# Patient Record
Sex: Male | Born: 1935 | Race: White | Hispanic: No | Marital: Single | Smoking: Former smoker
Health system: Southern US, Community
[De-identification: ages and names within clinical notes are randomized; demographics above are authoritative.]

## PROBLEM LIST (undated history)

## (undated) DIAGNOSIS — E785 Hyperlipidemia, unspecified: Secondary | ICD-10-CM

## (undated) DIAGNOSIS — M1990 Unspecified osteoarthritis, unspecified site: Secondary | ICD-10-CM

## (undated) DIAGNOSIS — I4891 Unspecified atrial fibrillation: Secondary | ICD-10-CM

## (undated) DIAGNOSIS — N529 Male erectile dysfunction, unspecified: Secondary | ICD-10-CM

## (undated) DIAGNOSIS — R7301 Impaired fasting glucose: Secondary | ICD-10-CM

## (undated) DIAGNOSIS — M519 Unspecified thoracic, thoracolumbar and lumbosacral intervertebral disc disorder: Secondary | ICD-10-CM

## (undated) DIAGNOSIS — K219 Gastro-esophageal reflux disease without esophagitis: Secondary | ICD-10-CM

## (undated) DIAGNOSIS — N400 Enlarged prostate without lower urinary tract symptoms: Secondary | ICD-10-CM

## (undated) DIAGNOSIS — M5090 Cervical disc disorder, unspecified, unspecified cervical region: Secondary | ICD-10-CM

## (undated) DIAGNOSIS — I10 Essential (primary) hypertension: Secondary | ICD-10-CM

## (undated) DIAGNOSIS — G479 Sleep disorder, unspecified: Secondary | ICD-10-CM

## (undated) DIAGNOSIS — K5792 Diverticulitis of intestine, part unspecified, without perforation or abscess without bleeding: Secondary | ICD-10-CM

## (undated) HISTORY — DX: Unspecified osteoarthritis, unspecified site: M19.90

## (undated) HISTORY — DX: Gastro-esophageal reflux disease without esophagitis: K21.9

## (undated) HISTORY — DX: Essential (primary) hypertension: I10

## (undated) HISTORY — DX: Cervical disc disorder, unspecified, unspecified cervical region: M50.90

## (undated) HISTORY — DX: Impaired fasting glucose: R73.01

## (undated) HISTORY — DX: Male erectile dysfunction, unspecified: N52.9

## (undated) HISTORY — DX: Unspecified thoracic, thoracolumbar and lumbosacral intervertebral disc disorder: M51.9

## (undated) HISTORY — DX: Diverticulitis of intestine, part unspecified, without perforation or abscess without bleeding: K57.92

## (undated) HISTORY — DX: Sleep disorder, unspecified: G47.9

## (undated) HISTORY — DX: Benign prostatic hyperplasia without lower urinary tract symptoms: N40.0

## (undated) HISTORY — DX: Hyperlipidemia, unspecified: E78.5

## (undated) HISTORY — DX: Unspecified atrial fibrillation: I48.91

---
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
## 1998-06-21 HISTORY — PX: KNEE ARTHROSCOPY: SUR90

## 1998-11-23 ENCOUNTER — Ambulatory Visit (HOSPITAL_COMMUNITY): Admission: RE | Admit: 1998-11-23 | Discharge: 1998-11-23 | Payer: Self-pay | Admitting: Cardiology

## 1998-12-03 ENCOUNTER — Encounter: Payer: Self-pay | Admitting: Internal Medicine

## 2001-10-21 ENCOUNTER — Encounter: Payer: Self-pay | Admitting: Internal Medicine

## 2001-10-21 LAB — CONVERTED CEMR LAB: PSA: 1.6 ng/mL

## 2003-11-22 LAB — FECAL OCCULT BLOOD, GUAIAC: Fecal Occult Blood: NEGATIVE

## 2004-09-27 ENCOUNTER — Ambulatory Visit: Payer: Self-pay | Admitting: Internal Medicine

## 2004-10-11 ENCOUNTER — Ambulatory Visit: Payer: Self-pay | Admitting: Internal Medicine

## 2004-10-21 HISTORY — PX: HEMIARTHROPLASTY SHOULDER FRACTURE: SUR653

## 2004-11-05 ENCOUNTER — Ambulatory Visit: Payer: Self-pay | Admitting: Internal Medicine

## 2004-11-09 ENCOUNTER — Other Ambulatory Visit: Payer: Self-pay

## 2004-11-09 ENCOUNTER — Encounter: Payer: Self-pay | Admitting: Internal Medicine

## 2004-11-16 ENCOUNTER — Inpatient Hospital Stay: Payer: Self-pay | Admitting: Unknown Physician Specialty

## 2004-11-19 ENCOUNTER — Ambulatory Visit: Payer: Self-pay | Admitting: Family Medicine

## 2004-11-23 ENCOUNTER — Ambulatory Visit: Payer: Self-pay | Admitting: Internal Medicine

## 2004-11-30 ENCOUNTER — Ambulatory Visit: Payer: Self-pay | Admitting: Internal Medicine

## 2004-12-06 ENCOUNTER — Ambulatory Visit: Payer: Self-pay | Admitting: Internal Medicine

## 2004-12-16 ENCOUNTER — Ambulatory Visit: Payer: Self-pay | Admitting: Internal Medicine

## 2004-12-22 HISTORY — PX: SHOULDER SURGERY: SHX246

## 2004-12-28 ENCOUNTER — Inpatient Hospital Stay: Payer: Self-pay | Admitting: Unknown Physician Specialty

## 2005-01-13 ENCOUNTER — Ambulatory Visit: Payer: Self-pay | Admitting: Internal Medicine

## 2005-01-27 ENCOUNTER — Ambulatory Visit: Payer: Self-pay | Admitting: Internal Medicine

## 2005-02-23 ENCOUNTER — Ambulatory Visit: Payer: Self-pay | Admitting: Internal Medicine

## 2005-03-23 ENCOUNTER — Ambulatory Visit: Payer: Self-pay | Admitting: Internal Medicine

## 2005-04-21 ENCOUNTER — Ambulatory Visit: Payer: Self-pay | Admitting: Internal Medicine

## 2005-05-02 ENCOUNTER — Ambulatory Visit: Payer: Self-pay | Admitting: Gastroenterology

## 2005-05-18 ENCOUNTER — Ambulatory Visit: Payer: Self-pay | Admitting: Internal Medicine

## 2005-05-21 LAB — HM COLONOSCOPY

## 2005-05-26 ENCOUNTER — Ambulatory Visit: Payer: Self-pay | Admitting: Gastroenterology

## 2005-05-26 ENCOUNTER — Encounter (INDEPENDENT_AMBULATORY_CARE_PROVIDER_SITE_OTHER): Payer: Self-pay | Admitting: *Deleted

## 2005-06-15 ENCOUNTER — Ambulatory Visit: Payer: Self-pay | Admitting: Internal Medicine

## 2005-07-13 ENCOUNTER — Ambulatory Visit: Payer: Self-pay | Admitting: Internal Medicine

## 2005-08-10 ENCOUNTER — Ambulatory Visit: Payer: Self-pay | Admitting: Internal Medicine

## 2005-09-07 ENCOUNTER — Ambulatory Visit: Payer: Self-pay | Admitting: Internal Medicine

## 2005-10-10 ENCOUNTER — Ambulatory Visit: Payer: Self-pay | Admitting: Internal Medicine

## 2005-10-21 ENCOUNTER — Ambulatory Visit: Payer: Self-pay | Admitting: Internal Medicine

## 2005-11-09 ENCOUNTER — Ambulatory Visit: Payer: Self-pay | Admitting: Internal Medicine

## 2005-12-07 ENCOUNTER — Ambulatory Visit: Payer: Self-pay | Admitting: Internal Medicine

## 2006-01-04 ENCOUNTER — Ambulatory Visit: Payer: Self-pay | Admitting: Internal Medicine

## 2006-02-15 ENCOUNTER — Ambulatory Visit: Payer: Self-pay | Admitting: Internal Medicine

## 2006-03-29 ENCOUNTER — Ambulatory Visit: Payer: Self-pay | Admitting: Internal Medicine

## 2006-04-05 ENCOUNTER — Ambulatory Visit: Payer: Self-pay | Admitting: Internal Medicine

## 2006-04-21 ENCOUNTER — Ambulatory Visit: Payer: Self-pay | Admitting: Internal Medicine

## 2006-05-18 ENCOUNTER — Ambulatory Visit: Payer: Self-pay | Admitting: Internal Medicine

## 2006-06-28 ENCOUNTER — Ambulatory Visit: Payer: Self-pay | Admitting: Internal Medicine

## 2006-08-02 ENCOUNTER — Ambulatory Visit: Payer: Self-pay | Admitting: Internal Medicine

## 2006-09-13 ENCOUNTER — Ambulatory Visit: Payer: Self-pay | Admitting: Internal Medicine

## 2006-10-27 ENCOUNTER — Ambulatory Visit: Payer: Self-pay | Admitting: Internal Medicine

## 2006-12-06 ENCOUNTER — Ambulatory Visit: Payer: Self-pay | Admitting: Internal Medicine

## 2007-01-08 ENCOUNTER — Ambulatory Visit: Payer: Self-pay | Admitting: Internal Medicine

## 2007-01-09 ENCOUNTER — Ambulatory Visit: Payer: Self-pay | Admitting: Internal Medicine

## 2007-01-10 ENCOUNTER — Ambulatory Visit: Payer: Self-pay | Admitting: Internal Medicine

## 2007-01-11 ENCOUNTER — Ambulatory Visit: Payer: Self-pay | Admitting: Internal Medicine

## 2007-01-15 ENCOUNTER — Ambulatory Visit: Payer: Self-pay | Admitting: Internal Medicine

## 2007-02-13 ENCOUNTER — Ambulatory Visit: Payer: Self-pay | Admitting: Internal Medicine

## 2007-02-19 ENCOUNTER — Encounter: Payer: Self-pay | Admitting: Internal Medicine

## 2007-02-19 ENCOUNTER — Ambulatory Visit: Payer: Self-pay | Admitting: Internal Medicine

## 2007-02-19 DIAGNOSIS — E785 Hyperlipidemia, unspecified: Secondary | ICD-10-CM | POA: Insufficient documentation

## 2007-02-19 DIAGNOSIS — N529 Male erectile dysfunction, unspecified: Secondary | ICD-10-CM | POA: Insufficient documentation

## 2007-02-19 DIAGNOSIS — J309 Allergic rhinitis, unspecified: Secondary | ICD-10-CM | POA: Insufficient documentation

## 2007-02-19 DIAGNOSIS — I10 Essential (primary) hypertension: Secondary | ICD-10-CM | POA: Insufficient documentation

## 2007-02-19 DIAGNOSIS — K5730 Diverticulosis of large intestine without perforation or abscess without bleeding: Secondary | ICD-10-CM | POA: Insufficient documentation

## 2007-02-19 DIAGNOSIS — I4891 Unspecified atrial fibrillation: Secondary | ICD-10-CM | POA: Insufficient documentation

## 2007-02-26 ENCOUNTER — Ambulatory Visit: Payer: Self-pay | Admitting: Internal Medicine

## 2007-02-28 ENCOUNTER — Ambulatory Visit: Payer: Self-pay | Admitting: Internal Medicine

## 2007-03-14 ENCOUNTER — Ambulatory Visit: Payer: Self-pay | Admitting: Internal Medicine

## 2007-04-25 ENCOUNTER — Ambulatory Visit: Payer: Self-pay | Admitting: Internal Medicine

## 2007-04-25 LAB — CONVERTED CEMR LAB
INR: 3.9
Prothrombin Time: 24 s

## 2007-04-27 ENCOUNTER — Ambulatory Visit: Payer: Self-pay | Admitting: Internal Medicine

## 2007-04-27 DIAGNOSIS — R972 Elevated prostate specific antigen [PSA]: Secondary | ICD-10-CM | POA: Insufficient documentation

## 2007-04-27 LAB — CONVERTED CEMR LAB
Albumin: 4.3 g/dL (ref 3.5–5.2)
BUN: 7 mg/dL (ref 6–23)
CO2: 30 meq/L (ref 19–32)
Calcium: 9.4 mg/dL (ref 8.4–10.5)
Chloride: 100 meq/L (ref 96–112)
Creatinine, Ser: 0.8 mg/dL (ref 0.4–1.5)
GFR calc Af Amer: 123 mL/min
GFR calc non Af Amer: 102 mL/min
Glucose, Bld: 93 mg/dL (ref 70–99)
PSA: 3.81 ng/mL (ref 0.10–4.00)
Phosphorus: 3.4 mg/dL (ref 2.3–4.6)
Potassium: 4.3 meq/L (ref 3.5–5.1)
Sodium: 136 meq/L (ref 135–145)

## 2007-05-09 ENCOUNTER — Ambulatory Visit: Payer: Self-pay | Admitting: Internal Medicine

## 2007-05-09 LAB — CONVERTED CEMR LAB
INR: 1.3
Prothrombin Time: 14.3 s

## 2007-05-14 ENCOUNTER — Ambulatory Visit: Payer: Self-pay | Admitting: Internal Medicine

## 2007-06-04 ENCOUNTER — Ambulatory Visit: Payer: Self-pay | Admitting: Internal Medicine

## 2007-07-09 ENCOUNTER — Ambulatory Visit: Payer: Self-pay | Admitting: Internal Medicine

## 2007-07-09 LAB — CONVERTED CEMR LAB
INR: 2.3
Prothrombin Time: 18.5 s

## 2007-08-20 ENCOUNTER — Ambulatory Visit: Payer: Self-pay | Admitting: Internal Medicine

## 2007-08-20 LAB — CONVERTED CEMR LAB
INR: 1.7
Prothrombin Time: 15.8 s

## 2007-08-30 ENCOUNTER — Ambulatory Visit: Payer: Self-pay | Admitting: Family Medicine

## 2007-08-31 ENCOUNTER — Telehealth (INDEPENDENT_AMBULATORY_CARE_PROVIDER_SITE_OTHER): Payer: Self-pay | Admitting: Internal Medicine

## 2007-09-03 ENCOUNTER — Ambulatory Visit: Payer: Self-pay | Admitting: Internal Medicine

## 2007-09-03 LAB — CONVERTED CEMR LAB
INR: 1.8
Prothrombin Time: 16.7 s

## 2007-10-01 ENCOUNTER — Ambulatory Visit: Payer: Self-pay | Admitting: Internal Medicine

## 2007-10-01 LAB — CONVERTED CEMR LAB
INR: 2.3
Prothrombin Time: 18.4 s

## 2007-10-16 ENCOUNTER — Telehealth (INDEPENDENT_AMBULATORY_CARE_PROVIDER_SITE_OTHER): Payer: Self-pay | Admitting: *Deleted

## 2007-10-22 ENCOUNTER — Encounter: Payer: Self-pay | Admitting: Internal Medicine

## 2007-11-02 ENCOUNTER — Telehealth (INDEPENDENT_AMBULATORY_CARE_PROVIDER_SITE_OTHER): Payer: Self-pay | Admitting: *Deleted

## 2007-11-05 ENCOUNTER — Ambulatory Visit: Payer: Self-pay | Admitting: Internal Medicine

## 2007-11-06 LAB — CONVERTED CEMR LAB
Albumin: 4.3 g/dL (ref 3.5–5.2)
BUN: 13 mg/dL (ref 6–23)
CO2: 30 meq/L (ref 19–32)
Calcium: 9.8 mg/dL (ref 8.4–10.5)
Chloride: 98 meq/L (ref 96–112)
Cholesterol: 198 mg/dL (ref 0–200)
Creatinine, Ser: 0.8 mg/dL (ref 0.4–1.5)
GFR calc Af Amer: 123 mL/min
GFR calc non Af Amer: 102 mL/min
Glucose, Bld: 93 mg/dL (ref 70–99)
HDL: 54.1 mg/dL (ref >39.0)
LDL Cholesterol: 131 mg/dL — ABNORMAL HIGH (ref 0–99)
Phosphorus: 3.6 mg/dL (ref 2.3–4.6)
Potassium: 4.2 meq/L (ref 3.5–5.1)
Sodium: 137 meq/L (ref 135–145)
Total CHOL/HDL Ratio: 3.7
Triglycerides: 64 mg/dL (ref 0–149)
VLDL: 13 mg/dL (ref 0–40)

## 2007-12-17 ENCOUNTER — Ambulatory Visit: Payer: Self-pay | Admitting: Internal Medicine

## 2007-12-17 LAB — CONVERTED CEMR LAB
INR: 2
Prothrombin Time: 17.4 s

## 2007-12-18 ENCOUNTER — Telehealth (INDEPENDENT_AMBULATORY_CARE_PROVIDER_SITE_OTHER): Payer: Self-pay | Admitting: *Deleted

## 2008-01-28 ENCOUNTER — Telehealth: Payer: Self-pay | Admitting: Family Medicine

## 2008-01-28 ENCOUNTER — Ambulatory Visit: Payer: Self-pay | Admitting: Internal Medicine

## 2008-01-28 LAB — CONVERTED CEMR LAB
INR: 2.1
Prothrombin Time: 17.1 s

## 2008-02-21 ENCOUNTER — Telehealth (INDEPENDENT_AMBULATORY_CARE_PROVIDER_SITE_OTHER): Payer: Self-pay | Admitting: *Deleted

## 2008-03-10 ENCOUNTER — Ambulatory Visit: Payer: Self-pay | Admitting: Internal Medicine

## 2008-03-10 LAB — CONVERTED CEMR LAB
INR: 3.4
Prothrombin Time: 22.4 s

## 2008-03-19 ENCOUNTER — Telehealth (INDEPENDENT_AMBULATORY_CARE_PROVIDER_SITE_OTHER): Payer: Self-pay | Admitting: *Deleted

## 2008-04-28 ENCOUNTER — Ambulatory Visit: Payer: Self-pay | Admitting: Internal Medicine

## 2008-04-29 LAB — CONVERTED CEMR LAB
ALT: 24 units/L (ref 0–53)
AST: 31 units/L (ref 0–37)
Albumin: 4.4 g/dL (ref 3.5–5.2)
Alkaline Phosphatase: 68 units/L (ref 39–117)
BUN: 9 mg/dL (ref 6–23)
Basophils Absolute: 0.1 10*3/uL (ref 0.0–0.1)
Basophils Relative: 1.3 % — ABNORMAL HIGH (ref 0.0–1.0)
Bilirubin, Direct: 0.1 mg/dL (ref 0.0–0.3)
CO2: 31 meq/L (ref 19–32)
Calcium: 9.5 mg/dL (ref 8.4–10.5)
Chloride: 100 meq/L (ref 96–112)
Cholesterol: 184 mg/dL (ref 0–200)
Creatinine, Ser: 0.8 mg/dL (ref 0.4–1.5)
Eosinophils Absolute: 0.5 10*3/uL (ref 0.0–0.7)
Eosinophils Relative: 8.5 % — ABNORMAL HIGH (ref 0.0–5.0)
GFR calc Af Amer: 123 mL/min
GFR calc non Af Amer: 101 mL/min
Glucose, Bld: 91 mg/dL (ref 70–99)
HCT: 42.8 % (ref 39.0–52.0)
HDL: 48.5 mg/dL (ref >39.0)
Hemoglobin: 15.1 g/dL (ref 13.0–17.0)
LDL Cholesterol: 120 mg/dL — ABNORMAL HIGH (ref 0–99)
Lymphocytes Relative: 24.8 % (ref 12.0–46.0)
MCHC: 35.2 g/dL (ref 30.0–36.0)
MCV: 92.7 fL (ref 78.0–100.0)
Monocytes Absolute: 0.6 10*3/uL (ref 0.1–1.0)
Monocytes Relative: 9.4 % (ref 3.0–12.0)
Neutro Abs: 3.2 10*3/uL (ref 1.4–7.7)
Neutrophils Relative %: 56 % (ref 43.0–77.0)
PSA: 4.02 ng/mL — ABNORMAL HIGH (ref 0.10–4.00)
Phosphorus: 3.2 mg/dL (ref 2.3–4.6)
Platelets: 219 10*3/uL (ref 150–400)
Potassium: 4.4 meq/L (ref 3.5–5.1)
RBC: 4.62 M/uL (ref 4.22–5.81)
RDW: 12.6 % (ref 11.5–14.6)
Sodium: 139 meq/L (ref 135–145)
TSH: 1.26 microintl units/mL (ref 0.35–5.50)
Total Bilirubin: 1.1 mg/dL (ref 0.3–1.2)
Total CHOL/HDL Ratio: 3.8
Total Protein: 7.2 g/dL (ref 6.0–8.3)
Triglycerides: 77 mg/dL (ref 0–149)
VLDL: 15 mg/dL (ref 0–40)
WBC: 5.9 10*3/uL (ref 4.5–10.5)

## 2008-05-13 ENCOUNTER — Telehealth (INDEPENDENT_AMBULATORY_CARE_PROVIDER_SITE_OTHER): Payer: Self-pay | Admitting: *Deleted

## 2008-05-28 ENCOUNTER — Telehealth (INDEPENDENT_AMBULATORY_CARE_PROVIDER_SITE_OTHER): Payer: Self-pay | Admitting: *Deleted

## 2008-06-09 ENCOUNTER — Ambulatory Visit: Payer: Self-pay | Admitting: Internal Medicine

## 2008-06-09 LAB — CONVERTED CEMR LAB
INR: 2.6
Prothrombin Time: 19.5 s

## 2008-07-16 ENCOUNTER — Telehealth: Payer: Self-pay | Admitting: Internal Medicine

## 2008-07-21 ENCOUNTER — Telehealth: Payer: Self-pay | Admitting: Internal Medicine

## 2008-07-21 ENCOUNTER — Ambulatory Visit: Payer: Self-pay | Admitting: Internal Medicine

## 2008-07-21 LAB — CONVERTED CEMR LAB
INR: 2.8
Prothrombin Time: 20.3 s

## 2008-07-29 ENCOUNTER — Telehealth: Payer: Self-pay | Admitting: Internal Medicine

## 2008-08-18 ENCOUNTER — Telehealth: Payer: Self-pay | Admitting: Internal Medicine

## 2008-09-01 ENCOUNTER — Ambulatory Visit: Payer: Self-pay | Admitting: Internal Medicine

## 2008-09-01 ENCOUNTER — Ambulatory Visit: Payer: Self-pay | Admitting: Family Medicine

## 2008-09-01 LAB — CONVERTED CEMR LAB
INR: 3.1
Prothrombin Time: 21.2 s

## 2008-09-08 ENCOUNTER — Ambulatory Visit: Payer: Self-pay | Admitting: Internal Medicine

## 2008-09-18 ENCOUNTER — Telehealth: Payer: Self-pay | Admitting: Internal Medicine

## 2008-09-30 ENCOUNTER — Ambulatory Visit: Payer: Self-pay | Admitting: Internal Medicine

## 2008-09-30 LAB — CONVERTED CEMR LAB
INR: 2.1
Prothrombin Time: 17.7 s

## 2008-10-15 ENCOUNTER — Telehealth: Payer: Self-pay | Admitting: Family Medicine

## 2008-10-29 ENCOUNTER — Ambulatory Visit: Payer: Self-pay | Admitting: Internal Medicine

## 2008-10-30 LAB — CONVERTED CEMR LAB
PSA, Free Pct: 18 — ABNORMAL LOW (ref >25)
PSA, Free: 0.7 ng/mL
PSA: 3.92 ng/mL (ref 0.10–4.00)

## 2008-11-06 ENCOUNTER — Telehealth: Payer: Self-pay | Admitting: Internal Medicine

## 2008-11-18 ENCOUNTER — Telehealth: Payer: Self-pay | Admitting: Internal Medicine

## 2008-12-10 ENCOUNTER — Ambulatory Visit: Payer: Self-pay | Admitting: Internal Medicine

## 2008-12-10 LAB — CONVERTED CEMR LAB
INR: 2
Prothrombin Time: 17.5 s

## 2009-01-13 ENCOUNTER — Telehealth: Payer: Self-pay | Admitting: Internal Medicine

## 2009-01-21 ENCOUNTER — Ambulatory Visit: Payer: Self-pay | Admitting: Internal Medicine

## 2009-01-21 LAB — CONVERTED CEMR LAB
INR: 2.2
Prothrombin Time: 18.1 s

## 2009-03-04 ENCOUNTER — Ambulatory Visit: Payer: Self-pay | Admitting: Internal Medicine

## 2009-03-04 LAB — CONVERTED CEMR LAB
INR: 4.1
Prothrombin Time: 24.7 s

## 2009-03-13 ENCOUNTER — Ambulatory Visit: Payer: Self-pay | Admitting: Internal Medicine

## 2009-03-13 LAB — CONVERTED CEMR LAB
INR: 2
Prothrombin Time: 17.5 s

## 2009-03-16 ENCOUNTER — Telehealth: Payer: Self-pay | Admitting: Internal Medicine

## 2009-03-27 ENCOUNTER — Ambulatory Visit: Payer: Self-pay | Admitting: Family Medicine

## 2009-03-27 LAB — CONVERTED CEMR LAB
INR: 1.7
Prothrombin Time: 15.9 s

## 2009-04-24 ENCOUNTER — Ambulatory Visit: Payer: Self-pay | Admitting: Internal Medicine

## 2009-04-24 LAB — CONVERTED CEMR LAB
INR: 3.3
Prothrombin Time: 21.8 s

## 2009-04-30 ENCOUNTER — Telehealth: Payer: Self-pay | Admitting: Internal Medicine

## 2009-05-05 ENCOUNTER — Ambulatory Visit: Payer: Self-pay | Admitting: Internal Medicine

## 2009-05-05 DIAGNOSIS — F39 Unspecified mood [affective] disorder: Secondary | ICD-10-CM | POA: Insufficient documentation

## 2009-05-06 LAB — CONVERTED CEMR LAB
ALT: 23 units/L (ref 0–53)
AST: 34 units/L (ref 0–37)
Albumin: 4.5 g/dL (ref 3.5–5.2)
Alkaline Phosphatase: 65 units/L (ref 39–117)
BUN: 18 mg/dL (ref 6–23)
Basophils Absolute: 0 10*3/uL (ref 0.0–0.1)
Basophils Relative: 0.6 % (ref 0.0–3.0)
Bilirubin, Direct: 0.1 mg/dL (ref 0.0–0.3)
CO2: 28 meq/L (ref 19–32)
Calcium: 9.5 mg/dL (ref 8.4–10.5)
Chloride: 102 meq/L (ref 96–112)
Creatinine, Ser: 0.8 mg/dL (ref 0.4–1.5)
Eosinophils Absolute: 0.4 10*3/uL (ref 0.0–0.7)
Eosinophils Relative: 6.8 % — ABNORMAL HIGH (ref 0.0–5.0)
Glucose, Bld: 91 mg/dL (ref 70–99)
HCT: 42.1 % (ref 39.0–52.0)
Hemoglobin: 14.6 g/dL (ref 13.0–17.0)
Lymphocytes Relative: 26.3 % (ref 12.0–46.0)
Lymphs Abs: 1.6 10*3/uL (ref 0.7–4.0)
MCHC: 34.7 g/dL (ref 30.0–36.0)
MCV: 93.6 fL (ref 78.0–100.0)
Monocytes Absolute: 0.5 10*3/uL (ref 0.1–1.0)
Monocytes Relative: 8.6 % (ref 3.0–12.0)
Neutro Abs: 3.6 10*3/uL (ref 1.4–7.7)
Neutrophils Relative %: 57.7 % (ref 43.0–77.0)
Phosphorus: 3.5 mg/dL (ref 2.3–4.6)
Platelets: 198 10*3/uL (ref 150.0–400.0)
Potassium: 3.6 meq/L (ref 3.5–5.1)
RBC: 4.49 M/uL (ref 4.22–5.81)
RDW: 12.3 % (ref 11.5–14.6)
Sodium: 137 meq/L (ref 135–145)
TSH: 0.76 microintl units/mL (ref 0.35–5.50)
Total Bilirubin: 1.1 mg/dL (ref 0.3–1.2)
Total Protein: 7.3 g/dL (ref 6.0–8.3)
WBC: 6.1 10*3/uL (ref 4.5–10.5)

## 2009-05-27 ENCOUNTER — Ambulatory Visit: Payer: Self-pay | Admitting: Internal Medicine

## 2009-05-27 LAB — CONVERTED CEMR LAB
INR: 2.7
Prothrombin Time: 19.8 s

## 2009-05-28 ENCOUNTER — Telehealth: Payer: Self-pay | Admitting: Internal Medicine

## 2009-07-08 ENCOUNTER — Ambulatory Visit: Payer: Self-pay | Admitting: Internal Medicine

## 2009-07-08 LAB — CONVERTED CEMR LAB
INR: 2.1
Prothrombin Time: 17.9 s

## 2009-07-28 ENCOUNTER — Telehealth: Payer: Self-pay | Admitting: Internal Medicine

## 2009-08-05 ENCOUNTER — Ambulatory Visit: Payer: Self-pay | Admitting: Internal Medicine

## 2009-08-05 LAB — CONVERTED CEMR LAB
INR: 3.2
Prothrombin Time: 21.6 s

## 2009-08-10 ENCOUNTER — Ambulatory Visit: Payer: Self-pay | Admitting: Family Medicine

## 2009-08-10 ENCOUNTER — Encounter: Payer: Self-pay | Admitting: Family Medicine

## 2009-08-11 ENCOUNTER — Telehealth: Payer: Self-pay | Admitting: Family Medicine

## 2009-08-18 ENCOUNTER — Ambulatory Visit: Payer: Self-pay | Admitting: Cardiovascular Disease

## 2009-09-01 ENCOUNTER — Ambulatory Visit: Payer: Self-pay

## 2009-09-01 ENCOUNTER — Encounter: Payer: Self-pay | Admitting: Cardiovascular Disease

## 2009-09-16 ENCOUNTER — Ambulatory Visit: Payer: Self-pay | Admitting: Internal Medicine

## 2009-09-16 LAB — CONVERTED CEMR LAB
INR: 3.1
Prothrombin Time: 21.2 s

## 2009-09-25 ENCOUNTER — Telehealth: Payer: Self-pay | Admitting: Internal Medicine

## 2009-10-12 ENCOUNTER — Telehealth: Payer: Self-pay | Admitting: Internal Medicine

## 2009-10-22 ENCOUNTER — Ambulatory Visit: Payer: Self-pay

## 2009-11-02 ENCOUNTER — Ambulatory Visit: Payer: Self-pay | Admitting: Internal Medicine

## 2009-11-02 LAB — CONVERTED CEMR LAB
INR: 2.2
Prothrombin Time: 18.2 s

## 2009-11-04 LAB — CONVERTED CEMR LAB
ALT: 32 units/L (ref 0–53)
AST: 38 units/L — ABNORMAL HIGH (ref 0–37)
Albumin: 4.7 g/dL (ref 3.5–5.2)
Alkaline Phosphatase: 63 units/L (ref 39–117)
BUN: 14 mg/dL (ref 6–23)
Basophils Absolute: 0 10*3/uL (ref 0.0–0.1)
Basophils Relative: 0 % (ref 0.0–3.0)
Bilirubin, Direct: 0.1 mg/dL (ref 0.0–0.3)
CO2: 31 meq/L (ref 19–32)
Calcium: 9.9 mg/dL (ref 8.4–10.5)
Chloride: 100 meq/L (ref 96–112)
Cholesterol: 191 mg/dL (ref 0–200)
Creatinine, Ser: 0.7 mg/dL (ref 0.4–1.5)
Eosinophils Absolute: 0 10*3/uL (ref 0.0–0.7)
Eosinophils Relative: 0.3 % (ref 0.0–5.0)
GFR calc non Af Amer: 117.52 mL/min (ref >60)
Glucose, Bld: 126 mg/dL — ABNORMAL HIGH (ref 70–99)
HCT: 44.9 % (ref 39.0–52.0)
HDL: 53.8 mg/dL (ref >39.00)
Hemoglobin: 15.2 g/dL (ref 13.0–17.0)
LDL Cholesterol: 128 mg/dL — ABNORMAL HIGH (ref 0–99)
Lymphocytes Relative: 15.1 % (ref 12.0–46.0)
Lymphs Abs: 0.9 10*3/uL (ref 0.7–4.0)
MCHC: 33.9 g/dL (ref 30.0–36.0)
MCV: 96.4 fL (ref 78.0–100.0)
Monocytes Absolute: 0.2 10*3/uL (ref 0.1–1.0)
Monocytes Relative: 3.3 % (ref 3.0–12.0)
Neutro Abs: 4.7 10*3/uL (ref 1.4–7.7)
Neutrophils Relative %: 81.3 % — ABNORMAL HIGH (ref 43.0–77.0)
PSA: 3.5 ng/mL (ref 0.10–4.00)
Phosphorus: 3.8 mg/dL (ref 2.3–4.6)
Platelets: 214 10*3/uL (ref 150.0–400.0)
Potassium: 4.1 meq/L (ref 3.5–5.1)
RBC: 4.66 M/uL (ref 4.22–5.81)
RDW: 12 % (ref 11.5–14.6)
Sodium: 139 meq/L (ref 135–145)
TSH: 0.6 microintl units/mL (ref 0.35–5.50)
Total Bilirubin: 1.1 mg/dL (ref 0.3–1.2)
Total CHOL/HDL Ratio: 4
Total Protein: 7.5 g/dL (ref 6.0–8.3)
Triglycerides: 47 mg/dL (ref 0.0–149.0)
VLDL: 9.4 mg/dL (ref 0.0–40.0)
WBC: 5.8 10*3/uL (ref 4.5–10.5)

## 2009-11-25 ENCOUNTER — Telehealth: Payer: Self-pay | Admitting: Family Medicine

## 2009-11-27 ENCOUNTER — Telehealth: Payer: Self-pay | Admitting: Family Medicine

## 2009-12-14 ENCOUNTER — Ambulatory Visit: Payer: Self-pay | Admitting: Internal Medicine

## 2009-12-14 ENCOUNTER — Telehealth: Payer: Self-pay | Admitting: Internal Medicine

## 2009-12-14 LAB — CONVERTED CEMR LAB
INR: 2.5
Prothrombin Time: 19.3 s

## 2009-12-23 ENCOUNTER — Telehealth: Payer: Self-pay | Admitting: Family Medicine

## 2009-12-29 ENCOUNTER — Telehealth: Payer: Self-pay | Admitting: Internal Medicine

## 2010-01-18 ENCOUNTER — Telehealth: Payer: Self-pay | Admitting: Internal Medicine

## 2010-01-25 ENCOUNTER — Ambulatory Visit: Payer: Self-pay | Admitting: Internal Medicine

## 2010-01-25 LAB — CONVERTED CEMR LAB: INR: 2

## 2010-01-26 ENCOUNTER — Telehealth: Payer: Self-pay | Admitting: Family Medicine

## 2010-02-25 ENCOUNTER — Telehealth: Payer: Self-pay | Admitting: Internal Medicine

## 2010-03-08 ENCOUNTER — Ambulatory Visit: Payer: Self-pay | Admitting: Internal Medicine

## 2010-03-08 LAB — CONVERTED CEMR LAB
INR: 2.1
Prothrombin Time: 25.8 s

## 2010-03-18 ENCOUNTER — Telehealth: Payer: Self-pay | Admitting: Internal Medicine

## 2010-04-06 ENCOUNTER — Ambulatory Visit: Payer: Self-pay | Admitting: Internal Medicine

## 2010-04-07 ENCOUNTER — Encounter: Payer: Self-pay | Admitting: Internal Medicine

## 2010-04-20 ENCOUNTER — Ambulatory Visit: Payer: Self-pay | Admitting: Internal Medicine

## 2010-04-20 LAB — CONVERTED CEMR LAB
INR: 2
Prothrombin Time: 23.9 s

## 2010-04-26 ENCOUNTER — Telehealth: Payer: Self-pay | Admitting: Internal Medicine

## 2010-04-28 ENCOUNTER — Telehealth: Payer: Self-pay | Admitting: Internal Medicine

## 2010-04-30 ENCOUNTER — Encounter (INDEPENDENT_AMBULATORY_CARE_PROVIDER_SITE_OTHER): Payer: Self-pay | Admitting: *Deleted

## 2010-05-04 ENCOUNTER — Ambulatory Visit: Payer: Self-pay | Admitting: Internal Medicine

## 2010-05-04 DIAGNOSIS — M5136 Other intervertebral disc degeneration, lumbar region: Secondary | ICD-10-CM | POA: Insufficient documentation

## 2010-05-04 DIAGNOSIS — R7301 Impaired fasting glucose: Secondary | ICD-10-CM | POA: Insufficient documentation

## 2010-05-05 LAB — CONVERTED CEMR LAB
Albumin: 4.5 g/dL (ref 3.5–5.2)
BUN: 16 mg/dL (ref 6–23)
CO2: 31 meq/L (ref 19–32)
Calcium: 9.5 mg/dL (ref 8.4–10.5)
Chloride: 101 meq/L (ref 96–112)
Creatinine, Ser: 0.7 mg/dL (ref 0.4–1.5)
GFR calc non Af Amer: 110.07 mL/min (ref >60)
Glucose, Bld: 97 mg/dL (ref 70–99)
Hgb A1c MFr Bld: 5.9 % (ref 4.6–6.5)
Phosphorus: 3.5 mg/dL (ref 2.3–4.6)
Potassium: 3.9 meq/L (ref 3.5–5.1)
Sodium: 140 meq/L (ref 135–145)

## 2010-05-17 ENCOUNTER — Telehealth: Payer: Self-pay | Admitting: Internal Medicine

## 2010-05-28 ENCOUNTER — Ambulatory Visit: Payer: Self-pay | Admitting: Internal Medicine

## 2010-05-28 LAB — CONVERTED CEMR LAB
INR: 1.9
Prothrombin Time: 22.7 s

## 2010-06-08 ENCOUNTER — Encounter: Payer: Self-pay | Admitting: Internal Medicine

## 2010-06-28 ENCOUNTER — Telehealth: Payer: Self-pay | Admitting: Internal Medicine

## 2010-07-07 ENCOUNTER — Ambulatory Visit: Payer: Self-pay | Admitting: Internal Medicine

## 2010-07-07 LAB — CONVERTED CEMR LAB
INR: 2.6
Prothrombin Time: 30.9 s

## 2010-07-29 ENCOUNTER — Ambulatory Visit: Payer: Self-pay | Admitting: Cardiovascular Disease

## 2010-08-12 ENCOUNTER — Telehealth: Payer: Self-pay | Admitting: Internal Medicine

## 2010-08-18 ENCOUNTER — Ambulatory Visit: Payer: Self-pay | Admitting: Internal Medicine

## 2010-08-18 LAB — CONVERTED CEMR LAB
INR: 1.9
Prothrombin Time: 22.3 s

## 2010-08-25 ENCOUNTER — Telehealth: Payer: Self-pay | Admitting: Internal Medicine

## 2010-09-06 ENCOUNTER — Telehealth: Payer: Self-pay | Admitting: Internal Medicine

## 2010-09-29 ENCOUNTER — Ambulatory Visit: Payer: Self-pay | Admitting: Internal Medicine

## 2010-09-29 LAB — CONVERTED CEMR LAB
INR: 2
Prothrombin Time: 24.1 s

## 2010-10-04 ENCOUNTER — Telehealth: Payer: Self-pay | Admitting: Internal Medicine

## 2010-10-25 ENCOUNTER — Telehealth: Payer: Self-pay | Admitting: Internal Medicine

## 2010-11-01 ENCOUNTER — Ambulatory Visit: Payer: Self-pay | Admitting: Internal Medicine

## 2010-11-01 LAB — CONVERTED CEMR LAB
INR: 1.5
Prothrombin Time: 18.2 s

## 2010-11-02 LAB — CONVERTED CEMR LAB
ALT: 27 units/L (ref 0–53)
AST: 36 units/L (ref 0–37)
Albumin: 4.6 g/dL (ref 3.5–5.2)
Alkaline Phosphatase: 77 units/L (ref 39–117)
BUN: 11 mg/dL (ref 6–23)
Basophils Absolute: 0 10*3/uL (ref 0.0–0.1)
Basophils Relative: 0.3 % (ref 0.0–3.0)
Bilirubin, Direct: 0.2 mg/dL (ref 0.0–0.3)
CO2: 31 meq/L (ref 19–32)
Calcium: 10 mg/dL (ref 8.4–10.5)
Chloride: 97 meq/L (ref 96–112)
Creatinine, Ser: 0.7 mg/dL (ref 0.4–1.5)
Eosinophils Absolute: 0.4 10*3/uL (ref 0.0–0.7)
Eosinophils Relative: 5.9 % — ABNORMAL HIGH (ref 0.0–5.0)
GFR calc non Af Amer: 111.65 mL/min (ref >60.00)
Glucose, Bld: 95 mg/dL (ref 70–99)
HCT: 45.2 % (ref 39.0–52.0)
Hemoglobin: 15.6 g/dL (ref 13.0–17.0)
Lymphocytes Relative: 21.8 % (ref 12.0–46.0)
Lymphs Abs: 1.6 10*3/uL (ref 0.7–4.0)
MCHC: 34.4 g/dL (ref 30.0–36.0)
MCV: 93.5 fL (ref 78.0–100.0)
Monocytes Absolute: 0.5 10*3/uL (ref 0.1–1.0)
Monocytes Relative: 6.6 % (ref 3.0–12.0)
Neutro Abs: 4.8 10*3/uL (ref 1.4–7.7)
Neutrophils Relative %: 65.4 % (ref 43.0–77.0)
Phosphorus: 3.7 mg/dL (ref 2.3–4.6)
Platelets: 242 10*3/uL (ref 150.0–400.0)
Potassium: 4.2 meq/L (ref 3.5–5.1)
RBC: 4.84 M/uL (ref 4.22–5.81)
RDW: 13.4 % (ref 11.5–14.6)
Sodium: 138 meq/L (ref 135–145)
TSH: 1.43 microintl units/mL (ref 0.35–5.50)
Total Bilirubin: 0.8 mg/dL (ref 0.3–1.2)
Total Protein: 7.5 g/dL (ref 6.0–8.3)
WBC: 7.3 10*3/uL (ref 4.5–10.5)

## 2010-11-17 ENCOUNTER — Ambulatory Visit: Payer: Self-pay | Admitting: Internal Medicine

## 2010-11-17 ENCOUNTER — Ambulatory Visit
Admission: RE | Admit: 2010-11-17 | Discharge: 2010-11-17 | Payer: Self-pay | Source: Home / Self Care | Attending: Internal Medicine | Admitting: Internal Medicine

## 2010-11-17 DIAGNOSIS — R519 Headache, unspecified: Secondary | ICD-10-CM | POA: Insufficient documentation

## 2010-11-17 DIAGNOSIS — R51 Headache: Secondary | ICD-10-CM | POA: Insufficient documentation

## 2010-11-17 LAB — CONVERTED CEMR LAB
INR: 1.9
Prothrombin Time: 22.3 s

## 2010-11-19 ENCOUNTER — Telehealth: Payer: Self-pay | Admitting: Internal Medicine

## 2010-11-25 ENCOUNTER — Ambulatory Visit
Admission: RE | Admit: 2010-11-25 | Discharge: 2010-11-25 | Payer: Self-pay | Source: Home / Self Care | Attending: Cardiovascular Disease | Admitting: Cardiovascular Disease

## 2010-11-25 ENCOUNTER — Telehealth: Payer: Self-pay | Admitting: Family Medicine

## 2010-11-25 ENCOUNTER — Encounter: Payer: Self-pay | Admitting: Cardiovascular Disease

## 2010-11-25 DIAGNOSIS — R42 Dizziness and giddiness: Secondary | ICD-10-CM | POA: Insufficient documentation

## 2010-12-01 ENCOUNTER — Telehealth: Payer: Self-pay | Admitting: Internal Medicine

## 2010-12-01 ENCOUNTER — Ambulatory Visit
Admission: RE | Admit: 2010-12-01 | Discharge: 2010-12-01 | Payer: Self-pay | Source: Home / Self Care | Attending: Internal Medicine | Admitting: Internal Medicine

## 2010-12-01 LAB — CONVERTED CEMR LAB
INR: 3.4
Prothrombin Time: 40.6 s

## 2010-12-15 ENCOUNTER — Telehealth: Payer: Self-pay | Admitting: Internal Medicine

## 2010-12-19 LAB — CONVERTED CEMR LAB
PSA, Free Pct: 12 — ABNORMAL LOW (ref >25)
PSA, Free: 0.5 ng/mL
PSA: 4.01 ng/mL — ABNORMAL HIGH (ref 0.10–4.00)

## 2010-12-21 NOTE — Progress Notes (Signed)
Summary: ALPRAZOLAM/ HYDROCODONE-APAP  Phone Note [facility] Request [facility] 9:15 AM  Refills Requested: Medication #1:  ALPRAZOLAM 1 MG TABS 1 tablet by mouth twice a day as needed   Last Refilled: 02/12/2009  Medication #2:  HYDROCODONE-ACETAMINOPHEN 5-500 MG TABS 1 two times a day as needed for pain   Last Refilled: 02/12/2009 Form on your desk   Method Requested: Fax to Local Pharmacy Initial call taken by: Mervin Hack CMA,  March 16, 2009 9:16 AM  Follow-up for Phone Call        okay #60 x 1 for each Follow-up by: Cindee Salt MD,  March 16, 2009 2:07 PM  Additional Follow-up for Phone Call Additional follow up Details #1::        Rx faxed to pharmacy Additional Follow-up by: Mervin Hack CMA,  March 16, 2009 3:39 PM      Prescriptions: HYDROCODONE-ACETAMINOPHEN 5-500 MG TABS (HYDROCODONE-ACETAMINOPHEN) 1 two times a day as needed for pain  #60 x 1   Entered by:   Mervin Hack CMA   Authorized by:   Cindee Salt MD   Signed by:   Mervin Hack CMA on 03/16/2009   Method used:   Handwritten   RxID:   [id number] ALPRAZOLAM 1 MG TABS (ALPRAZOLAM) 1 tablet by mouth twice a day as needed  #60 x 1   Entered by:   Mervin Hack CMA   Authorized by:   Cindee Salt MD   Signed by:   Mervin Hack CMA on 03/16/2009   Method used:   Handwritten   RxID:   [id number]

## 2010-12-21 NOTE — Progress Notes (Signed)
Summary: Hydrocodone/APAP  Phone Note Refill Request Message from:  Fax from Pharmacy on May 17, 2010 10:49 AM  Refills Requested: Medication #1:  HYDROCODONE-ACETAMINOPHEN 5-500 MG TABS take 1 by mouth three times a day as needed pain [facility] [address]  Phone:   [phone]   Method Requested: Telephone to Pharmacy Initial call taken by: Delilah Shan CMA Duncan Dull),  May 17, 2010 10:49 AM  Follow-up for Phone Call        having cervical spine problems and may need surgery change to four times daily as needed   #90 x 0 Follow-up by: Cindee Salt MD,  May 17, 2010 1:45 PM  Additional Follow-up for Phone Call Additional follow up Details #1::        Rx called to pharmacy Additional Follow-up by: DeShannon Katrinka Blazing CMA Duncan Dull),  May 17, 2010 2:38 PM    New/Updated Medications: HYDROCODONE-ACETAMINOPHEN 5-500 MG TABS (HYDROCODONE-ACETAMINOPHEN) Take 1 by mouth four times daily as needed Prescriptions: HYDROCODONE-ACETAMINOPHEN 5-500 MG TABS (HYDROCODONE-ACETAMINOPHEN) Take 1 by mouth four times daily as needed  #90 x 0   Entered by:   Mervin Hack CMA (AAMA)   Authorized by:   Cindee Salt MD   Signed by:   Mervin Hack CMA (AAMA) on 05/17/2010   Method used:   Telephoned to ...       [facility]  [id number]       [address]       [place], [place]  [address]       Ph: [phone] or [phone]       Fax: [phone]   RxID:   [id number]

## 2010-12-21 NOTE — Progress Notes (Signed)
Summary: refill request for vicodin  Phone Note Refill Request Message from:  Fax from Pharmacy  Refills Requested: Medication #1:  HYDROCODONE-ACETAMINOPHEN 5-500 MG TABS take 1 by mouth three times a day as needed pain   Last Refilled: 03/25/2010 Faxed request from [facility] [employer] is on your desk.  Initial call taken by: Lowella Petties CMA,  April 28, 2010 8:21 AM  Follow-up for Phone Call        okay #90 x 0 Follow-up by: Cindee Salt MD,  April 28, 2010 2:19 PM  Additional Follow-up for Phone Call Additional follow up Details #1::        Rx faxed to pharmacy Additional Follow-up by: DeShannon Smith CMA Duncan Dull),  April 28, 2010 4:33 PM    Prescriptions: HYDROCODONE-ACETAMINOPHEN 5-500 MG TABS (HYDROCODONE-ACETAMINOPHEN) take 1 by mouth three times a day as needed pain  #90 x 0   Entered by:   Mervin Hack CMA (AAMA)   Authorized by:   Cindee Salt MD   Signed by:   Mervin Hack CMA (AAMA) on 04/28/2010   Method used:   Handwritten   RxID:   [id number]

## 2010-12-21 NOTE — Progress Notes (Signed)
Summary: refill request for vicodin  Phone Note Refill Request Message from:  Fax from Pharmacy  Refills Requested: Medication #1:  HYDROCODONE-ACETAMINOPHEN 5-500 MG TABS 1 two times a day as needed for pain   Last Refilled: 10/27/2009 [facility] [address] on [facility].  Initial call taken by: Lowella Petties CMA,  February 25, 2010 8:14 AM  Follow-up for Phone Call        Please call should have had enough till end of April Is he using more than 2 per day?  If neck problems are worsening, may need to see pain specialist  okay only #60 x 0 will need appt to discuss situation before more Follow-up by: Cindee Salt MD,  February 25, 2010 1:10 PM  Additional Follow-up for Phone Call Additional follow up Details #1::        spoke with patient and he states he is having more pain because of the implants in his shoulder, the disc in his neck, deterioating disc in his back, and the torn ligament in his knee. 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 Pt states he tried not to take more than 2 per day, but somedays he can't help the pain but to take more. I advised pt he needs appt to re-access all the pain. Pt states he has appt here in june. rx faxed to pharmacy.  DeShannon Smith CMA Duncan Dull)  February 25, 2010 2:49 PM   Change the Rx to three times a day as needed  Let him know I will just see him for his appt in June Additional Follow-up by: Cindee Salt MD,  February 26, 2010 8:11 AM    Additional Follow-up for Phone Call Additional follow up Details #2::    rx for #90 was not called in, we had just done #60 on 02/25/2010 DeShannon Smith CMA (AAMA)  February 26, 2010 8:35 AM   New/Updated Medications: HYDROCODONE-ACETAMINOPHEN 5-500 MG TABS (HYDROCODONE-ACETAMINOPHEN) take 1 by mouth three times a day as needed  pain Prescriptions: HYDROCODONE-ACETAMINOPHEN 5-500 MG TABS (HYDROCODONE-ACETAMINOPHEN) take 1 by mouth three times a day as needed pain  #90 x 0   Entered by:   Mervin Hack CMA (AAMA)   Authorized by:   Cindee Salt MD   Signed by:   Mervin Hack CMA (AAMA) on 02/26/2010   Method used:   Telephoned to ...       [facility]  [id number]       [address]       [place], [place]  [address]       Ph: [phone] or [phone]       Fax: [phone]   RxID:   [id number] HYDROCODONE-ACETAMINOPHEN 5-500 MG TABS (HYDROCODONE-ACETAMINOPHEN) 1 two times a day as needed for pain  #60 x 0   Entered by:   Mervin Hack CMA (AAMA)   Authorized by:   Cindee Salt MD   Signed by:   Mervin Hack CMA (AAMA) on 02/25/2010   Method used:   Handwritten   RxID:   [id number]

## 2010-12-21 NOTE — Progress Notes (Signed)
Summary: refill request for hydrocodone  Phone Note Refill Request Message from:  Fax from Pharmacy  Refills Requested: Medication #1:  HYDROCODONE-ACETAMINOPHEN 5-500 MG TABS 1 two times a day as needed for pain   Last Refilled: 08/19/2008 [facility]  Initial call taken by: Lowella Petties,  September 18, 2008 4:13 PM  Follow-up for Phone Call        okay #60 x 1 Follow-up by: Cindee Salt MD,  September 18, 2008 5:24 PM  Additional Follow-up for Phone Call Additional follow up Details #1::        Rx faxed back to pharmacy. Additional Follow-up by: Sydell Axon,  September 18, 2008 5:28 PM      Prescriptions: HYDROCODONE-ACETAMINOPHEN 5-500 MG TABS (HYDROCODONE-ACETAMINOPHEN) 1 two times a day as needed for pain  #60 x 1   Entered by:   Sydell Axon   Authorized by:   Cindee Salt MD   Signed by:   Sydell Axon on 09/18/2008   Method used:   Handwritten   RxID:   [id number]

## 2010-12-21 NOTE — Assessment & Plan Note (Signed)
Summary: 2:15  ?SINUS,EAR INFECTION/CLE   Vital Signs:  Patient Profile:   76 Years Old Male Height:     68 inches Weight:      171.25 pounds Temp:     97.6 degrees F oral Pulse rate:   80 / minute Pulse rhythm:   regular BP sitting:   141 / 88  (left arm) Cuff size:   regular  Vitals Entered By: Delilah Shan (September 08, 2008 2:10 PM)                 Chief Complaint:  ? sinus infection.  History of Present Illness: Ongoing allergy problems since unable to get polaramine Claritin, chlor-trimeton, zyrtec not much help has stuck to claritin---slightly better when he takes 2 a day  [facility] some Noticed some gray discharge from his right ear--started a couple of weeks ago Constant post nasal drainage Hacking up phlegm--ongoing but may be some worse lately  has felt feverish at times slight chill and sweat this AM Slight sore throat    Current Allergies (reviewed today): * BEXTRA (VALDECOXIB) PERCOCET (OXYCODONE-ACETAMINOPHEN)  Past Medical History:    Reviewed history from 04/25/2007 and no changes required:       Allergic rhinitis       Atrial fibrillation       Diverticulosis, colon       Hyperlipidemia       Hypertension       Erectile dysfunction  Past Surgical History:    Reviewed history from 04/25/2007 and no changes required:       1950's  Dislocated shoulder       8/99      Right knee arthroscopy [facility])       11/03    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  NAD Head:     has maxillary tenderness Ears:     R ear normal and L ear normal.   Canals look fine Nose:     moderate congestion bilaterally Mouth:     no erythema and no exudates.   Neck:     supple and no cervical lymphadenopathy.   Lungs:     normal respiratory effort and normal breath sounds.      Impression & Recommendations:  Problem # 1:  SINUSITIS- ACUTE-NOS (ICD-461.9) Assessment: New seems to have bacterial sinus infection discussed taking 2 loratadine daily as he tends to get very congested analgesics His updated medication list for this problem includes:    Amoxicillin-pot Clavulanate 875-125 Mg Tabs (Amoxicillin-pot clavulanate) .Marland Kitchen... 1 two times a day   Complete Medication List: 1)  Alprazolam 1 Mg  Tabs (Alprazolam) .Marland Kitchen.. 1 tablet by mouth twice a day as needed 2)  Hydrochlorothiazide 25 Mg Tabs (Hydrochlorothiazide) .... Take 1 tablet by mouth every morning 3)  Coumadin 5 Mg Tabs (Warfarin sodium) .... As directed 4)  Multivitamins Tabs (Multiple vitamin) .Marland Kitchen.. 1 by mouth daily 5)  Prilosec Otc Tbec (Omeprazole magnesium tbec) .... As needed 6)  Mobic 15 Mg Tabs (Meloxicam) .Marland Kitchen.. 1 by mouth daily 7)  Vitamin C 500 Mg Tabs (Ascorbic acid) .Marland Kitchen.. 1 by mouth daily 8)  Potassium Tabs (Potassium tabs) .... Take one by mouth daily 9)  Claritin Tabs (Loratadine tabs) .... As needed 10)  Viagra 100 Mg Tabs (Sildenafil citrate) .... Take one by mouth as needed 11)  Hydrocodone-acetaminophen 5-500 Mg Tabs (Hydrocodone-acetaminophen) .Marland Kitchen.. 1 two times a day as needed for pain 12)  Tenormin 25 Mg Tabs (Atenolol) .... Take one by mouth daily 13)  Amoxicillin-pot Clavulanate 875-125 Mg Tabs (Amoxicillin-pot clavulanate) .Marland Kitchen.. 1 two times a day   Patient Instructions: 1)  Please schedule a follow-up appointment as needed.   Prescriptions: AMOXICILLIN-POT CLAVULANATE 875-125 MG TABS  (AMOXICILLIN-POT CLAVULANATE) 1 two times a day  #20 x 0   Entered and Authorized by:   Cindee Salt MD   Signed by:   Cindee Salt MD on 09/08/2008   Method used:   [facility]        [facility]  [address] [id number]       [address]       [place], [place]  [address]       Ph: [phone] or [phone]       Fax: [phone]   RxID:   [id number]  ] Current Allergies (reviewed today): * BEXTRA (VALDECOXIB) PERCOCET (OXYCODONE-ACETAMINOPHEN) Current Medications (including changes made in today's visit):  ALPRAZOLAM 1 MG TABS (ALPRAZOLAM) 1 tablet by mouth twice a day as needed HYDROCHLOROTHIAZIDE 25 MG TABS (HYDROCHLOROTHIAZIDE) Take 1 tablet by mouth every morning COUMADIN 5 MG TABS (WARFARIN SODIUM) As directed MULTIVITAMINS  TABS (MULTIPLE VITAMIN) 1 by mouth daily PRILOSEC OTC  TBEC (OMEPRAZOLE MAGNESIUM TBEC) as needed MOBIC 15 MG TABS (MELOXICAM) 1 by mouth daily VITAMIN C 500 MG TABS (ASCORBIC ACID) 1 by mouth daily POTASSIUM  TABS (POTASSIUM TABS) Take one by mouth daily CLARITIN  TABS (LORATADINE TABS) as needed VIAGRA 100 MG TABS (SILDENAFIL CITRATE) Take one by mouth as needed HYDROCODONE-ACETAMINOPHEN 5-500 MG TABS (HYDROCODONE-ACETAMINOPHEN) 1 two times a day as needed for pain TENORMIN 25 MG TABS (ATENOLOL) take one by mouth daily AMOXICILLIN-POT CLAVULANATE 875-125 MG TABS (AMOXICILLIN-POT CLAVULANATE) 1 two times a day

## 2010-12-21 NOTE — Letter (Signed)
Summary: [facility]  [facility]   Imported By: Lanelle Bal 05/05/2010 10:39:00  _____________________________________________________________________  External Attachment:    Type:   Image     Comment:   External Document  Appended Document: [facility] considering left median nerve release

## 2010-12-21 NOTE — Progress Notes (Signed)
Summary: ALPRAZOLAM/HYDROCODONE  Phone Note [facility]ill Request Message [facility] January 13, 2009 12:56 PM  Refills Requested: Medication #1:  ALPRAZOLAM 1 MG TABS 1 tablet by mouth twice a day as needed   Last Refilled: 12/16/2008  Medication #2:  HYDROCODONE-ACETAMINOPHEN 5-500 MG TABS 1 two times a day as needed for pain   Last Refilled: 12/15/2008 forms on your desk   Method Requested: Fax to Local Pharmacy Initial call taken by: Mervin Hack CMA,  January 13, 2009 12:56 PM  Follow-up for Phone Call        okay #60 x 1 for each Follow-up by: Cindee Salt MD,  January 13, 2009 1:51 PM  Additional Follow-up for Phone Call Additional follow up Details #1::        Rx faxed to pharmacy Additional Follow-up by: Mervin Hack CMA,  January 13, 2009 2:47 PM      Prescriptions: HYDROCODONE-ACETAMINOPHEN 5-500 MG TABS (HYDROCODONE-ACETAMINOPHEN) 1 two times a day as needed for pain  #60 x 1   Entered by:   Mervin Hack CMA   Authorized by:   Cindee Salt MD   Signed by:   Mervin Hack CMA on 01/13/2009   Method used:   Handwritten   RxID:   [id number] ALPRAZOLAM 1 MG TABS (ALPRAZOLAM) 1 tablet by mouth twice a day as needed  #60 x 1   Entered by:   Mervin Hack CMA   Authorized by:   Cindee Salt MD   Signed by:   Mervin Hack CMA on 01/13/2009   Method used:   Handwritten   RxID:   [id number]

## 2010-12-21 NOTE — Progress Notes (Signed)
  [facility] Note [facility]  Refills Requested: Medication #1:  ALPRAZOLAM 1 MG TABS 1 tablet by mouth twice a day  Method Requested: Fax to Local Pharmacy Initial call taken by: Wandra Mannan,  February 21, 2008 10:22 AM  Follow-up for Phone Call        okay #60 x 2 Follow-up by: Cindee Salt MD,  February 21, 2008 1:38 PM  Additional Follow-up for Phone Call Additional follow up Details #1::        rx faxed Additional Follow-up by: Wandra Mannan,  February 21, 2008 1:49 PM      Prescriptions: ALPRAZOLAM 1 MG TABS (ALPRAZOLAM) 1 tablet by mouth twice a day  #60 x 2   Entered by:   Wandra Mannan   Authorized by:   Cindee Salt MD   Signed by:   Wandra Mannan on 02/21/2008   Method used:   Handwritten   RxID:   [id number]

## 2010-12-21 NOTE — Assessment & Plan Note (Signed)
Summary: NUMBNESS IN LEFT HAND/DLO   Vital Signs:  Patient profile:   76 year old male Height:      66 inches Weight:      171.38 pounds BMI:     27.76 Temp:     97.8 degrees F oral Pulse rate:   56 / minute Pulse (ortho):   72 / minute Pulse rhythm:   irregular BP sitting:   134 / 76  (left arm) BP standing:   148 / 84 Cuff size:   regular  Vitals Entered By: Delilah Shan CMA (AAMA) (August 10, 2009 12:03 PM)  Serial Vital Signs/Assessments:  Time      Position  BP       Pulse  Resp  Temp     By 12:47 PM  Lying RA  158/94   72                    Lugene Fuquay CMA (AAMA) 12:47 PM  Lying LA  152/92   60                    Lugene Fuquay CMA (AAMA) 12:47 PM  Sitting   146/72   80                    Lugene Fuquay CMA (AAMA) 12:47 PM  Standing  148/84   72                    Lugene Fuquay CMA (AAMA)  CC: Numbness in left arm.  BP elevated at times and Pulse slower at times than usual.   History of Present Illness: 76 yo with h/o afib presents with 1 week h/o pain in left elbow and numbness in left 4th and fifth digits. Pain is 6/10 and is aggrevated by movement and improved by certain positions.  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  He denies any weakness or focal neruological symptoms.  He is very concerned that this is related to his atrial fibrillation because when he took his blood pressure at home today at was 144/92 and it typically runs in 130s/70s (Of note, in our records, runs 120s-140s/70s-90s).  Denies any dizziness, palpitations, or changes in headache pattern.  Medical history and medications reviewecd, old chart reviewed.  Allergies: 1)  * Bextra (Valdecoxib) 2)  Percocet (Oxycodone-Acetaminophen)  Past History:  Past Medical History: Last updated: 05/05/2009 Allergic rhinitis Atrial fibrillation Diverticulosis, colon Hyperlipidemia Hypertension Erectile  dysfunction Sleep disturbance Osteoarthritis  Family History: Reviewed history from 05/05/2009 and no changes required. HTN strong in family--both parents Mom died of stomach cancer Dad died of stroke 9 siblings--4 have died (1 in WW2, 2 other brothers when much older)  Review of Systems      See HPI General:  Denies fever. CV:  Denies chest pain or discomfort, difficulty breathing at night, fatigue, swelling of feet, and swelling of hands. MS:  Complains of joint pain; denies joint redness, joint swelling, and loss of strength. Neuro:  Denies brief paralysis, disturbances in coordination, falling down, inability to speak, visual disturbances, and weakness.  Physical Exam  General:  alert and normal appearance, anxious. Lungs:  normal respiratory effort and normal breath sounds.   Heart:  normal rate, no murmur, no gallop, and irregular rhythm.   Msk:  pos TTP over left lateral epicondyle, worsened  with resisted wrist extension/elbow extension.  normal ROM, no joint tenderness, no joint swelling, no joint warmth, and no redness over joints.   Neurologic:  alert & oriented X3, strength normal in all extremities, and gait normal.  cranial nerves II-XII intact, DTRs symmetrical and normal, and finger-to-nose normal.     Impression & Recommendations:  Problem # 1:  LATERAL EPICONDYLITIS, LEFT (ICD-726.32) Assessment New History and physical very consistent with epidcondylitis.  Discussed exercises and ways to decrease repetitive movements with that elbow.  If no improvement of symptoms, will come back for possible steroid injection or brace.  Problem # 2:  ATRIAL FIBRILLATION (ICD-427.31) Assessment: Unchanged Given his concern, feeling "funny" and risk factors,  obtained an EKG which was difficult to compare to prior given how old the prior EKG (2005) was.  He was dropping beats with non specific ST changes in lateral leads.  Will get cardiolyte given risk factors.  He is therapeutic  on his coumadin. His updated medication list for this problem includes:    Coumadin 5 Mg Tabs (Warfarin sodium) .Marland Kitchen... As directed    Tenormin 25 Mg Tabs (Atenolol) .Marland Kitchen... Take one by mouth daily  Orders: Cardiolite (Cardiolite) EKG w/ Interpretation (93000)  Complete Medication List: 1)  Alprazolam 1 Mg Tabs (Alprazolam) .Marland Kitchen.. 1 tablet by mouth twice a day as needed 2)  Hydrochlorothiazide 25 Mg Tabs (Hydrochlorothiazide) .... Take 1 tablet by mouth every morning 3)  Coumadin 5 Mg Tabs (Warfarin sodium) .... As directed 4)  Multivitamins Tabs (Multiple vitamin) .Marland Kitchen.. 1 by mouth daily 5)  Mobic 15 Mg Tabs (Meloxicam) .Marland Kitchen.. 1 by mouth daily 6)  Vitamin C 500 Mg Tabs (Ascorbic acid) .Marland Kitchen.. 1 by mouth daily 7)  Potassium Tabs (Potassium tabs) .... Take one by mouth daily 8)  Claritin Tabs (Loratadine tabs) .... As needed 9)  Viagra 100 Mg Tabs (Sildenafil citrate) .... Take one by mouth as needed 10)  Hydrocodone-acetaminophen 5-500 Mg Tabs (Hydrocodone-acetaminophen) .Marland Kitchen.. 1 two times a day as needed for pain 11)  Tenormin 25 Mg Tabs (Atenolol) .... Take one by mouth daily 12)  Zolpidem Tartrate 5 Mg Tabs (Zolpidem tartrate) .Marland Kitchen.. 1 at bedtime as needed to help sleep 13)  Zantac 150 Mg Tabs (Ranitidine hcl) .... Takes one tablet daily  Patient Instructions: 1)  You have tennis elbow. 2)  Try not to do repetitive movements with that arm. 3)  See if that helps, along with Tylenol. 4)  If it does not, we can get a brace that you wear at night. 5)  I am going to send you to get a cardiolite stress test to check out your other symptoms.  Current Allergies (reviewed today): * BEXTRA (VALDECOXIB) PERCOCET (OXYCODONE-ACETAMINOPHEN)

## 2010-12-21 NOTE — Assessment & Plan Note (Signed)
   Vital Signs:  Patient Profile:   76 Years Old Male Height:     68 inches Weight:      168.8 pounds BMI:     25.76 Temp:     98.7 degrees F oral Pulse rate:   68 / minute BP sitting:   122 / 78               Chief Complaint:  Swollen left leg--????MRSA.  History of Present Illness: woke 3/29----noted itchy area on L calf.  He noted knot there Got larger yesterday so went to urgent care----given Rx for bactroban and Septra MD there was worried about MRSA Some discharge from wound today No fever. May be slightly better today after 2 doses of antibiotic    Past Medical History:    Allergic rhinitis    Atrial fibrillation    Diverticulosis, colon    Hyperlipidemia    Hypertension     Review of Systems       Hand has completely healed up   Physical Exam  General:     Well-developed,well-nourished,in no acute distress; alert,appropriate and cooperative throughout examination Skin:     Pinpoint open area with pus expressed lateral distal left calf. Surrounding redness with mild warmth about 6-7 cm diameter    Impression & Recommendations:  Problem # 1:  CELLULITIS, LEFT LEG (ICD-682.6) Likely MRSA.  I explained that this is common and dictates the antibiotic choice but demystified it (urgent care doc had scared him).  P:  warm compresses      continue septra--consider refill if not completely gone by 10 days     change to clinda if not improving in 2-3 days

## 2010-12-21 NOTE — Progress Notes (Signed)
Summary: rx atenolol  Phone Note [facility] 10[facility]42 AM  e-scribe request for atenolol 25mg , not on patients med list.   Method Requested: Fax to Local Pharmacy Initial call taken by: Mervin Hack CMA,  November 06, 2008 10:43 AM  Follow-up for Phone Call        yes, it is the generic of tenormin  okay to refill x 1 year Follow-up by: Cindee Salt MD,  November 06, 2008 1:09 PM      Prescriptions: TENORMIN 25 MG TABS (ATENOLOL) take one by mouth daily  #30 x 12   Entered by:   Mervin Hack CMA   Authorized by:   Cindee Salt MD   Signed by:   Mervin Hack CMA on 11/06/2008   Method used:   [facility]  [id number]       [address] [facility], [place]  [address]       Ph: [phone] or [phone]       Fax: [phone]   RxID:   [id number]

## 2010-12-21 NOTE — Letter (Signed)
Summary: [facility] Form  [facility] Form   Imported By: Beau Fanny 10/23/2007 09:16:25  _____________________________________________________________________  External Attachment:    Type:   Image     Comment:   External Document

## 2010-12-21 NOTE — Assessment & Plan Note (Signed)
Summary: 6 month followup/rbh   Vital Signs:  Patient profile:   76 year old male Weight:      165 pounds Temp:     98.4 degrees F oral Pulse rate:   68 / minute Pulse rhythm:   regular BP sitting:   130 / 80  (left arm) Cuff size:   large  Vitals Entered By: Mervin Hack CMA (AAMA) (November 02, 2009 12:22 PM) CC: 6 month follow-up   History of Present Illness: Has cervical bulging disk Pressing on nerve giving numbness in left 5th finger and tip of fourth Had MRI Now on prednisone and flexeril No sig hand pain No weakness or loss of function Plans to follow up with Dr Gerrit Heck Will consider an epidural  Has done fine with his heart was worried this was from his heart had thorough work up which was benign No chest pain No SOB [facility] a [facility] his [facility] issues  No problems with anxiety does get some dizziness with flexeril    Allergies: 1)  * Bextra (Valdecoxib) 2)  Percocet (Oxycodone-Acetaminophen)  Past History:  Past medical, surgical, family and social histories (including risk factors) reviewed for relevance to current acute and chronic problems.  Past Medical History: Allergic rhinitis Atrial fibrillation, chronic persistent. Failed DCCV x 3 in 2000 Diverticulosis, colon Hyperlipidemia Hypertension Erectile dysfunction Sleep disturbance Osteoarthritis Lumbar and cervical disk disease  Past Surgical History: Reviewed history from 04/25/2007 and no changes required. 1950's  Dislocated shoulder 8/99      Right knee arthroscopy Gavin Potters) 11/03    Actinic chelitis (AU) 12/05    Left shoulder hemiarthroplasty 2/06      Right shoulder surgery  Family History: Reviewed history from 05/05/2009 and no changes required. HTN strong in family--both parents Mom died of stomach cancer Dad died of stroke 9 siblings--4 have died (1 in WW2, 2 other brothers when much older)  Social History: Reviewed history from 11/05/2007 and  no changes required. 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   Neck:  supple, no masses, no thyromegaly, no carotid bruits, and no cervical lymphadenopathy.   Lungs:  normal respiratory effort and normal breath sounds.   Heart:  normal rate, no murmur, no gallop, and irregular rhythm.   Abdomen:  soft and non-tender.   Msk:  no joint tenderness and no joint swelling.   Extremities:  no edema Psych:  normally interactive, good eye contact, not anxious appearing, and not depressed appearing.     Impression & Recommendations:  Problem # 1:  DEGENERATIVE DISC DISEASE (ICD-722.6) Assessment Comment Only some improvement on prednisone discussed not wanting surgery unless sig pain or disability  Problem # 2:  HYPERTENSION (ICD-401.9) Assessment: Unchanged  good control no changes needed  His updated medication list for this problem includes:    Hydrochlorothiazide 25 Mg Tabs (Hydrochlorothiazide) .Marland Kitchen... Take 1 tablet by mouth every morning    Tenormin 25 Mg Tabs (Atenolol) .Marland Kitchen... Take one by mouth daily  BP today: 130/80 Prior BP: 124/70 (08/18/2009)  Prior 10 Yr  Risk Heart Disease: Not enough information (04/27/2007)  Labs Reviewed: K+: 3.6 (05/05/2009) Creat: : 0.8 (05/05/2009)   Chol: 184 (04/28/2008)   HDL: 48.5 (04/28/2008)   LDL: 120 (04/28/2008)   TG: 77 (04/28/2008)  Orders: TLB-Renal Function Panel (80069-RENAL) TLB-CBC Platelet - w/Differential (85025-CBCD) TLB-TSH (Thyroid Stimulating Hormone) (84443-TSH) Venipuncture (16109)   Problem # 3:  SLEEP DISORDER (ICD-780.50) Assessment: Unchanged does okay with chronic ambien use  Problem # 4:  ATRIAL FIBRILLATION (ICD-427.31) Assessment: Unchanged good rate control on coumadin  His updated medication list for this problem includes:    Coumadin 5 Mg Tabs (Warfarin sodium) .Marland Kitchen... As directed    Tenormin 25 Mg Tabs (Atenolol) .Marland Kitchen... Take one by mouth daily  Problem # 5:  PSA, INCREASED (ICD-790.93) Assessment: Comment Only  he wishes to continue to monitor had been 4.02, then 3.92 1 year ago  Orders: TLB-PSA (Prostate Specific Antigen) (84153-PSA)  Complete Medication List: 1)  Alprazolam 1 Mg Tabs (Alprazolam) .Marland Kitchen.. 1 tablet by mouth twice a day as needed 2)  Hydrochlorothiazide 25 Mg Tabs (Hydrochlorothiazide) .... Take 1 tablet by mouth every morning 3)  Coumadin 5 Mg Tabs (Warfarin sodium) .... As directed 4)  Multivitamins Tabs (Multiple vitamin) .Marland Kitchen.. 1 by mouth daily 5)  Mobic 15 Mg Tabs (Meloxicam) .Marland Kitchen.. 1 by mouth daily 6)  Vitamin C 500 Mg Tabs (Ascorbic acid) .Marland Kitchen.. 1 by mouth daily 7)  Potassium Tabs (Potassium tabs) .... Take one by mouth daily 8)  Claritin Tabs (Loratadine tabs) .... As needed 9)  Viagra 100 Mg Tabs (Sildenafil citrate) .... Take one by mouth as needed 10)  Hydrocodone-acetaminophen 5-500 Mg Tabs (Hydrocodone-acetaminophen) .Marland Kitchen.. 1 two times a day as needed for pain 11)  Tenormin 25 Mg Tabs (Atenolol) .... Take one by mouth daily 12)  Zolpidem Tartrate 5 Mg Tabs (Zolpidem tartrate) .Marland Kitchen.. 1 at bedtime as needed to help sleep 13)  Zantac 150 Mg Tabs (Ranitidine hcl) .... Takes one tablet daily 14)  Prednisone 5 Mg/32ml Soln (Prednisone) .... Pt not sure of dose and strength 15)  Flexeril 10 Mg Tabs (Cyclobenzaprine hcl) .... Pt not sure of dose and strength  Other Orders: TLB-Lipid Panel (80061-LIPID) TLB-Hepatic/Liver Function Pnl (80076-HEPATIC)  Patient Instructions: 1)  Please schedule a follow-up appointment in 6 months .    Current Allergies (reviewed today): * BEXTRA (VALDECOXIB) PERCOCET (OXYCODONE-ACETAMINOPHEN)  Appended Document: 6 month followup/rbh     Clinical Lists Changes  Orders: Added new Service order of Flu Vaccine 58yrs + [phone]) - Signed Added new Service order of Admin 1st Vaccine (09811) - Signed Added new Service order of Admin 1st Vaccine [facility]) [phone]) - Signed Observations: Added new observation of FLU VAX#1VIS: 06/14/07 version given November 02, 2009. (11/02/2009 15:37) Added new observation of FLU VAXLOT: [id number] (11/02/2009 15:37) Added new observation of FLU VAX EXP: 05/20/2010 (11/02/2009 15:37) Added new observation of FLU VAXBY: DeShannon Smith CMA (AAMA) (11/02/2009 15:37) Added new observation of FLU VAXRTE: IM (11/02/2009 15:37) Added new observation of FLU VAX DSE: 0.5 ml (11/02/2009 15:37) Added new observation of FLU VAXMFR: [employer] (11/02/2009 15:37) Added new observation of FLU VAX SITE: left deltoid (11/02/2009 15:37) Added new observation of FLU VAX: Fluvax 3+ (11/02/2009 15:37)       Influenza Vaccine    Vaccine Type: Fluvax 3+    Site: left deltoid    Mfr: [employer]oSmithKline    Dose: 0.5 ml    Route: IM    Given by: Mervin Hack CMA (AAMA)    Exp. Date:  05/20/2010    Lot #: [id number]    VIS given: 06/14/07 version given November 02, 2009.  Flu Vaccine Consent Questions    Do you have a history of severe allergic reactions to this vaccine? no    Any prior history of allergic reactions to egg and/or gelatin? no    Do you have a sensitivity to the preservative Thimersol? no    Do you have a past history of Guillan-Barre Syndrome? no    Do you currently have an acute febrile illness? no    Have you ever had a severe reaction to latex? no    Vaccine information given and explained to patient? yes

## 2010-12-21 NOTE — Progress Notes (Signed)
  Phone Note Refill Request Message from:  Kirkland Hun [id number]  Refills Requested: Medication #1:  ALPRAZOLAM 1 MG TABS 1 tablet by mouth twice a day  Method Requested: Fax to Local Pharmacy Initial call taken by: Wandra Mannan,  December 18, 2007 8:31 AM  Follow-up for Phone Call        okay #60 x 1 Follow-up by: Cindee Salt MD,  December 18, 2007 9:05 AM  Additional Follow-up for Phone Call Additional follow up Details #1::        rx faxed Additional Follow-up by: Wandra Mannan,  December 18, 2007 9:08 AM      Prescriptions: ALPRAZOLAM 1 MG TABS (ALPRAZOLAM) 1 tablet by mouth twice a day  #60 x 1   Entered by:   Wandra Mannan   Authorized by:   Cindee Salt MD   Signed by:   Wandra Mannan on 12/18/2007   Method used:   Handwritten   RxID:   [id number]

## 2010-12-21 NOTE — Progress Notes (Signed)
Summary: zolpidem tartrate  Phone Note Refill Request Message from:  Fax from Pharmacy on September 06, 2010 1:57 PM  Refills Requested: Medication #1:  ZOLPIDEM TARTRATE 5 MG TABS 1 at bedtime as needed to help sleep   Last Refilled: 06/10/2010 [facility] [address] [phone] Form is on your desk.    Initial call taken by: Melody Comas,  September 06, 2010 1:57 PM  Follow-up for Phone Call        okay #90 x 0 Follow-up by: Cindee Salt MD,  September 06, 2010 5:31 PM  Additional Follow-up for Phone Call Additional follow up Details #1::        Rx faxed to pharmacy Additional Follow-up by: DeShannon Smith CMA Duncan Dull),  September 07, 2010 8:14 AM    Prescriptions: ZOLPIDEM TARTRATE 5 MG TABS (ZOLPIDEM TARTRATE) 1 at bedtime as needed to help sleep  #90 x 0   Entered by:   Mervin Hack CMA (AAMA)   Authorized by:   Cindee Salt MD   Signed by:   Mervin Hack CMA (AAMA) on 09/07/2010   Method used:   Handwritten   RxID:   [id number]

## 2010-12-21 NOTE — Progress Notes (Signed)
Summary: Rx Alprazolam  Phone Note Refill Request Call back at [phone] Message from:  [facility] [facility] on December 23, 2009 2:24 PM  Refills Requested: Medication #1:  ALPRAZOLAM 1 MG TABS 1 tablet by mouth twice a day as needed Received faxed refill request, please advise   Method Requested: Electronic Initial call taken by: Linde Gillis CMA Duncan Dull),  December 23, 2009 2:24 PM  Follow-up for Phone Call        Medication phoned to pharmacy.  Follow-up by: Delilah Shan CMA (AAMA),  December 23, 2009 2:40 PM    Prescriptions: ALPRAZOLAM 1 MG TABS (ALPRAZOLAM) 1 tablet by mouth twice a day as needed  #60 x 0   Entered and Authorized by:   Ruthe Mannan MD   Signed by:   Ruthe Mannan MD on 12/23/2009   Method used:   Handwritten   RxID:   [id number]

## 2010-12-21 NOTE — Progress Notes (Signed)
Summary: refill request for alprazolam  Phone Note Refill Request Message from:  Fax from Pharmacy  Refills Requested: Medication #1:  ALPRAZOLAM 1 MG TABS 1 tablet by mouth twice a day as needed   Last Refilled: 01/26/2010 [facility] [address] [facility] on [facility].  Initial call taken by: Lowella Petties CMA,  February 25, 2010 4:52 PM  Follow-up for Phone Call        okay #60 x 1 Follow-up by: Cindee Salt MD,  February 26, 2010 8:02 AM  Additional Follow-up for Phone Call Additional follow up Details #1::        Rx faxed to pharmacy Additional Follow-up by: DeShannon Smith CMA Duncan Dull),  February 26, 2010 8:35 AM    Prescriptions: ALPRAZOLAM 1 MG TABS (ALPRAZOLAM) 1 tablet by mouth twice a day as needed  #60 x 1   Entered by:   Mervin Hack CMA (AAMA)   Authorized by:   Cindee Salt MD   Signed by:   Mervin Hack CMA (AAMA) on 02/26/2010   Method used:   Handwritten   RxID:   [id number]

## 2010-12-21 NOTE — Assessment & Plan Note (Signed)
Summary: SINUS   Vital Signs:  Patient Profile:   76 Years Old Male Height:     68 inches Weight:      167 pounds Temp:     97.9 degrees F oral Pulse rate:   63 / minute BP sitting:   142 / 93  (left arm) Cuff size:   regular  Vitals Entered By: Cooper Render (August 30, 2007 3:50 PM)                 Chief Complaint:  sinus infection/ ha's/ cough.  History of Present Illness: Here with nasal congestion, HA,cough, face hurts, ST, no fever/chillsels bad--onset x2wks with head cold.  Taking Mucinex a nd Claritin--not helping.  Cant get the OTC allergy med he usep prevously for 30 yrs.    Eating and drinking OK, sleeping well.  Current Allergies (reviewed today): * BEXTRA (VALDECOXIB) PERCOCET (OXYCODONE-ACETAMINOPHEN)     Review of Systems      See HPI   Physical Exam  General:     alert, well-developed, and well-nourished. NAD Head:     normocephalic, atraumatic, and no abnormalities observed.   Eyes:     no injection.   Ears:     TMs retracted wit some lfluid Nose:     boggy and edematus, sinuses not tender Mouth:     injected, no exudate Lungs:     dry cough, no crackles and no wheezes.   Neurologic:     alert & oriented X3, sensation intact to light touch, and gait normal.   Skin:     turgor normal, color normal, and no rashes.   Psych:     normally interactive.      Impression & Recommendations:  Problem # 1:  URI (ICD-465.9) He will get some ChlorTrimeton to use tonight and call response in AM--this is the closest thing to lhis previous antihistamine available. Will add ABT if needed in next 5 d.  suspect this is allercic in origin. His updated medication list for this problem includes:    Mobic 15 Mg Tabs (Meloxicam) .Marland Kitchen... 1 by mouth daily    Claritin Tabs (Loratadine tabs) .Marland Kitchen... As needed   Complete Medication List: 1)  Alprazolam 1 Mg Tabs (Alprazolam) .Marland Kitchen.. 1 tablet by mouth twice a day 2)  Hydrochlorothiazide 25 Mg Tabs  (Hydrochlorothiazide) .... Take 1 tablet by mouth every morning 3)  Ambien 5 Mg Tabs (Zolpidem tartrate) 4)  Coumadin 5 Mg Tabs (Warfarin sodium) .... As directed 5)  Multivitamins Tabs (Multiple vitamin) .Marland Kitchen.. 1 by mouth daily 6)  Prilosec Otc Tbec (Omeprazole magnesium tbec) .... As needed 7)  Mobic 15 Mg Tabs (Meloxicam) .Marland Kitchen.. 1 by mouth daily 8)  Vitamin C 500 Mg Tabs (Ascorbic acid) .Marland Kitchen.. 1 by mouth daily 9)  Potassium Tabs (Potassium tabs) .... Daily 10)  Claritin Tabs (Loratadine tabs) .... As needed 11)  Viagra 100 Mg Tabs (Sildenafil citrate) .... As needed 12)  Hydrocodone-acetaminophen 5-500 Mg Tabs (Hydrocodone-acetaminophen) .... As needed     ]

## 2010-12-21 NOTE — Assessment & Plan Note (Signed)
Summary: ROA 6 MTHS CYD   Vital Signs:  Patient profile:   76 year old male Weight:      164 pounds Temp:     98.4 degrees F oral Pulse rate:   60 / minute Pulse rhythm:   regular BP sitting:   148 / 70  (left arm) Cuff size:   large  Vitals Entered By: Mervin Hack CMA Duncan Dull) (May 04, 2010 12:24 PM) CC: 6 month follow-up   History of Present Illness: Got over respiratory infection still with some mucus not sick now occ cough  Having joint aching  Bulging disc and radiculopathy in neck gives him headaches no sig arm radiation Seeing Dr Gerrit Heck who is considering surgery flexeril  no help still using hydrocodone  Having some nubmenss in left hand----part of 3rd and 4th fingers---not cervical radiculopathy (Dr Kemper Durie has done nerve conduction studies)  No heart troubles Recent eval by Dr Excell Seltzer no chest pain Occ sense of heart going fast--like when using garden tiller No sig SOB---stable DOE neck problems have caused him to stop exercise in the gym   Allergies: 1)  * Bextra (Valdecoxib) 2)  Percocet (Oxycodone-Acetaminophen)  Past History:  Past medical, surgical, family and social histories (including risk factors) reviewed for relevance to current acute and chronic problems.  Past Medical History: Allergic rhinitis Atrial fibrillation, chronic persistent. Failed DCCV x 3 in 2000 Diverticulosis, colon Hyperlipidemia Hypertension Erectile dysfunction Sleep disturbance Osteoarthritis Lumbar and cervical disk disease Impaired fasting glucose  Past Surgical History: Reviewed history from 04/25/2007 and no changes required. 1950's  Dislocated shoulder 8/99      Right knee arthroscopy [facility] Potters) 11/03    Actinic chelitis (AU) 12/05    Left shoulder hemiarthroplasty 2/06      Right shoulder surgery  Family History: Reviewed history from 05/05/2009 and no changes required. HTN strong in family--both parents Mom died of stomach cancer Dad died of  stroke 9 siblings--4 have died (1 in WW2, 2 other brothers when much older)  Social History: Reviewed history from 11/05/2007 and no changes required. 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   Neck:  supple, no masses, no thyromegaly, no carotid bruits, and no cervical lymphadenopathy.   Lungs:  normal respiratory effort and normal breath sounds.   Heart:  normal rate, no murmur, no gallop, and irregular rhythm.   Abdomen:  soft and non-tender.   Pulses:  1+ in feet Extremities:  no edema Psych:  normally interactive, good eye contact, not anxious appearing, and not depressed appearing.     Impression & Recommendations:  Problem # 1:  IMPAIRED FASTING GLUCOSE (ICD-790.21) Assessment Comment Only  last glucose 126 will recheck  Orders: TLB-A1C / Hgb A1C (Glycohemoglobin) (83036-A1C)  Problem # 2:  DEGENERATIVE DISC DISEASE, CERVICAL SPINE (ICD-722.4) Assessment: Deteriorated may need surgery Dr Gerrit Heck seeing him  Problem # 3:  HYPERTENSION (ICD-401.9) Assessment: Unchanged  reasonable control no changes needed  His updated medication list for this problem includes:    Hydrochlorothiazide 25 Mg Tabs (Hydrochlorothiazide) .Marland Kitchen... Take 1 tablet by mouth every morning    Tenormin 25 Mg Tabs (Atenolol) .Marland Kitchen... Take one by mouth daily  BP today: 148/70 Prior BP: 138/70 (04/06/2010)  Prior  10 Yr Risk Heart Disease: Not enough information (04/27/2007)  Labs Reviewed: K+: 4.1 (11/02/2009) Creat: : 0.7 (11/02/2009)   Chol: 191 (11/02/2009)   HDL: 53.80 (11/02/2009)   LDL: 128 (11/02/2009)   TG: 47.0 (11/02/2009)  Orders: Venipuncture (16109) TLB-Renal Function  Panel (80069-RENAL)  Problem # 4:  ATRIAL FIBRILLATION (ICD-427.31) Assessment: Comment Only   good rate control on coumadin  His updated medication list for this problem includes:    Coumadin 5 Mg Tabs (Warfarin sodium) .Marland Kitchen... As directed    Tenormin 25 Mg Tabs (Atenolol) .Marland Kitchen... Take one by mouth daily  Complete Medication List: 1)  Alprazolam 1 Mg Tabs (Alprazolam) .Marland Kitchen.. 1 tablet by mouth twice a day as needed 2)  Hydrochlorothiazide 25 Mg Tabs (Hydrochlorothiazide) .... Take 1 tablet by mouth every morning 3)  Coumadin 5 Mg Tabs (Warfarin sodium) .... As directed 4)  Viagra 100 Mg Tabs (Sildenafil citrate) .... Take one by mouth as needed 5)  Hydrocodone-acetaminophen 5-500 Mg Tabs (Hydrocodone-acetaminophen) .... Take 1 by mouth three times a day as needed pain 6)  Tenormin 25 Mg Tabs (Atenolol) .... Take one by mouth daily 7)  Zolpidem Tartrate 5 Mg Tabs (Zolpidem tartrate) .Marland Kitchen.. 1 at bedtime as needed to help sleep 8)  Zantac 150 Mg Tabs (Ranitidine hcl) .... Takes one tablet daily 9)  Potassium Tabs (Potassium tabs) .... Take one by mouth daily 10)  Multivitamins Tabs (Multiple vitamin) .Marland Kitchen.. 1 by mouth daily 11)  Vitamin C 500 Mg Tabs (Ascorbic acid) .Marland Kitchen.. 1 by mouth daily  Patient Instructions: 1)  Please schedule a follow-up appointment in 6 months .  Prescriptions: ZOLPIDEM TARTRATE 5 MG TABS (ZOLPIDEM TARTRATE) 1 at bedtime as needed to help sleep  #90 x 0   Entered by:   Mervin Hack CMA (AAMA)   Authorized by:   Cindee Salt MD   Signed by:   Mervin Hack CMA (AAMA) on 05/04/2010   Method used:   Print then Give to Patient   RxID:   [id number]   Current Allergies (reviewed today): * BEXTRA (VALDECOXIB) PERCOCET (OXYCODONE-ACETAMINOPHEN)

## 2010-12-21 NOTE — Assessment & Plan Note (Signed)
Summary: flu shot per ko/dlo   Nurse Visit    Prior Medications: ALPRAZOLAM 1 MG TABS (ALPRAZOLAM) 1 tablet by mouth twice a day as needed HYDROCHLOROTHIAZIDE 25 MG TABS (HYDROCHLOROTHIAZIDE) Take 1 tablet by mouth every morning COUMADIN 5 MG TABS (WARFARIN SODIUM) As directed MULTIVITAMINS  TABS (MULTIPLE VITAMIN) 1 by mouth daily PRILOSEC OTC  TBEC (OMEPRAZOLE MAGNESIUM TBEC) as needed MOBIC 15 MG TABS (MELOXICAM) 1 by mouth daily VITAMIN C 500 MG TABS (ASCORBIC ACID) 1 by mouth daily POTASSIUM  TABS (POTASSIUM TABS) Take one by mouth daily CLARITIN  TABS (LORATADINE TABS) as needed VIAGRA 100 MG TABS (SILDENAFIL CITRATE) Take one by mouth as needed HYDROCODONE-ACETAMINOPHEN 5-500 MG TABS (HYDROCODONE-ACETAMINOPHEN) 1 two times a day as needed for pain TENORMIN 25 MG TABS (ATENOLOL) take one by mouth daily Current Allergies: * BEXTRA (VALDECOXIB) PERCOCET (OXYCODONE-ACETAMINOPHEN)    Orders Added: 1)  Admin 1st Vaccine [90471] 2)  Flu Vaccine 8yrs + [16109]   Flu Vaccine Consent Questions     Do you have a history of severe allergic reactions to this vaccine? no    Any prior history of allergic reactions to egg and/or gelatin? no    Do you have a sensitivity to the preservative Thimersol? no    Do you have a past history of Guillan-Barre Syndrome? no    Do you currently have an acute febrile illness? no    Have you ever had a severe reaction to latex? no    Vaccine information given and explained to patient? yes    Are you currently pregnant? no    Lot Number:[id number]   Site Given  Left Deltoid IM].opcflu

## 2010-12-21 NOTE — Progress Notes (Signed)
Summary: refill request for alprazolam  Phone Note Refill Request Message from:  Fax from Pharmacy  Refills Requested: Medication #1:  ALPRAZOLAM 1 MG TABS 1 tablet by mouth twice a day as needed   Last Refilled: 03/28/2010 Faxed request from [facility] is on your desk.  Initial call taken by: Lowella Petties CMA,  April 26, 2010 8:26 AM  Follow-up for Phone Call        1 month with a refill done only about 5 weeks ago Please check with pharmacy about what is going on Cindee Salt MD  April 26, 2010 1:49 PM   rx was actually done on 02/26/2010, I don't know what happened on 03/18/2010 the pharmacist confirmed xanax was not sent on 03/18/2010. A hard copy of Zolpidem Tartrate was faxed on 03/18/2010. I'm trying to fix this in EMR but not sure how. DeShannon Katrinka Blazing CMA Duncan Dull)  April 26, 2010 5:48 PM   Just make it right with pharmacy and leave in the phone note Follow-up by: Cindee Salt MD,  April 26, 2010 7:58 PM  Additional Follow-up for Phone Call Additional follow up Details #1::        Rx called to pharmacy Additional Follow-up by: DeShannon Smith CMA Duncan Dull),  April 27, 2010 2:11 PM    Prescriptions: ALPRAZOLAM 1 MG TABS (ALPRAZOLAM) 1 tablet by mouth twice a day as needed  #60 x 1   Entered by:   Mervin Hack CMA (AAMA)   Authorized by:   Cindee Salt MD   Signed by:   Mervin Hack CMA (AAMA) on 04/27/2010   Method used:   [facility]phoned to ...       [id number]       [address]       [place], [place]  [address]       Ph: [phone] or [phone]       Fax: [phone]   RxID:   [id number]

## 2010-12-21 NOTE — Progress Notes (Signed)
Summary: refill requests for vicodin and xanax  Phone Note Refill Request Message from:  Fax from Pharmacy  Refills Requested: Medication #1:  ALPRAZOLAM 1 MG TABS 1 tablet by mouth twice a day as needed   Last Refilled: 10/15/2008  Medication #2:  HYDROCODONE-ACETAMINOPHEN 5-500 MG TABS 1 two times a day as needed for pain   Last Refilled: 10/20/2008 Faxed request from [facility], forms are on your desk.  Initial call taken by: Lowella Petties,  November 18, 2008 10:54 AM  Follow-up for Phone Call        okay #60 x 1 for each Follow-up by: Cindee Salt MD,  November 18, 2008 1:19 PM  Additional Follow-up for Phone Call Additional follow up Details #1::        rx faxed to pharmacy Additional Follow-up by: Mervin Hack CMA,  November 18, 2008 3:18 PM      Prescriptions: ALPRAZOLAM 1 MG TABS (ALPRAZOLAM) 1 tablet by mouth twice a day as needed  #60 x 1   Entered by:   Mervin Hack CMA   Authorized by:   Cindee Salt MD   Signed by:   Mervin Hack CMA on 11/18/2008   Method used:   Handwritten   RxID:   [id number] HYDROCODONE-ACETAMINOPHEN 5-500 MG TABS (HYDROCODONE-ACETAMINOPHEN) 1 two times a day as needed for pain  #60 x 1   Entered by:   Mervin Hack CMA   Authorized by:   Cindee Salt MD   Signed by:   Mervin Hack CMA on 11/18/2008   Method used:   Handwritten   RxID:   [id number]

## 2010-12-21 NOTE — Progress Notes (Signed)
  [facility]  Refills Requested: Medication #1:  HYDROCODONE-ACETAMINOPHEN 5-500 MG TABS 1 two times a day as needed for pain.  Method Requested: Fax to Local Pharmacy Initial call taken by: Wandra Mannan,  March 19, 2008 4:25 PM  Follow-up for Phone Call        okay #60 x 0 Follow-up by: Cindee Salt MD,  March 20, 2008 8:04 AM  Additional Follow-up for Phone Call Additional follow up Details #1::        rx faxed Additional Follow-up by: Wandra Mannan,  March 20, 2008 8:10 AM      Prescriptions: HYDROCODONE-ACETAMINOPHEN 5-500 MG TABS (HYDROCODONE-ACETAMINOPHEN) 1 two times a day as needed for pain  #60 x 0   Entered by:   Wandra Mannan   Authorized by:   Cindee Salt MD   Signed by:   Wandra Mannan on 03/20/2008   Method used:   Handwritten   RxID:   [id number]

## 2010-12-21 NOTE — Progress Notes (Signed)
Summary: alprazolam   Phone Note Refill Request Message from:  Patient on August 25, 2010 2:11 PM  Refills Requested: Medication #1:  ALPRAZOLAM 1 MG TABS 1 tablet by mouth twice a day as needed   Last Refilled: 07/27/2010 [facility] [address] [phone]. Form is on your desk.   Initial call taken by: Melody Comas,  August 25, 2010 2:11 PM  Follow-up for Phone Call        okay #60 x 1 Follow-up by: Cindee Salt MD,  August 26, 2010 1:21 PM  Additional Follow-up for Phone Call Additional follow up Details #1::        Rx faxed to pharmacy Additional Follow-up by: DeShannon Smith CMA [facility] Dull),  August 26, 2010 2:22 PM    Prescriptions: ALPRAZOLAM 1 MG TABS (ALPRAZOLAM) 1 tablet by mouth twice a day as needed  #60 x 1   Entered by:   Mervin Hack CMA (AAMA)   Authorized by:   Cindee Salt MD   Signed by:   Mervin Hack CMA (AAMA) on 08/26/2010   Method used:   Handwritten   RxID:   [id number]

## 2010-12-21 NOTE — Progress Notes (Signed)
Summary: Rx Hydrocodone  Phone Note Refill Request Call back at [phone] Message [facility] [facility] on January 18, 2010 9:00 AM  Refills Requested: Medication #1:  HYDROCODONE-ACETAMINOPHEN 5-500 MG TABS 1 two times a day as needed for pain   Last Refilled: 12/14/2009 Received faxed refill request, form in your IN box please advise   Method Requested: Fax to Local Pharmacy Initial call taken by: Linde Gillis CMA [facility] Dull),  January 18, 2010 9:01 AM  Follow-up for Phone Call        okay #60 x 1 Follow-up by: Cindee Salt MD,  January 18, 2010 11:54 AM  Additional Follow-up for Phone Call Additional follow up Details #1::        Rx faxed to pharmacy Additional Follow-up by: DeShannon Smith CMA [facility] Dull),  January 18, 2010 2:37 PM    Prescriptions: HYDROCODONE-ACETAMINOPHEN 5-500 MG TABS (HYDROCODONE-ACETAMINOPHEN) 1 two times a day as needed for pain  #60 x 1   Entered by:   Mervin Hack CMA (AAMA)   Authorized by:   Cindee Salt MD   Signed by:   Mervin Hack CMA (AAMA) on 01/18/2010   Method used:   Handwritten   RxID:   [id number]

## 2010-12-21 NOTE — Assessment & Plan Note (Signed)
Summary: COUGH AND CONGESTION/ 12:45 per dr Alphonsus Sias   Vital Signs:  Patient profile:   76 year old male Weight:      164 pounds O2 Sat:      99 % on Room air Temp:     98.3 degrees F oral Pulse rate:   64 / minute Pulse rhythm:   regular Resp:     18 per minute BP sitting:   138 / 70  (left arm) Cuff size:   large  Vitals Entered By: Mervin Hack CMA [facility] Dull) (Apr 06, 2010 1:21 PM)  O2 Flow:  Room air CC: cough, congestion   History of Present Illness: Started sneezing and eyes watering about 9 days ago seemed to be allergy problems uses antihistamines Generally goes to sleep early and gets up by 5PM Next day congestion was worse now has gone into cold with bad head congestion some clearing with mucinex DM Now into chest  Noted whistling with breathing and some wheezing last night some SOB No clear cut fever but felt hot and sweaty last night Lots of cough---only couging up slight specks  Allergies: 1)  * Bextra (Valdecoxib) 2)  Percocet (Oxycodone-Acetaminophen)  Past History:  Past medical, surgical, family and social histories (including risk factors) reviewed for relevance to current acute and chronic problems.  Past Medical History: Reviewed history from 11/02/2009 and no changes required. Allergic rhinitis Atrial fibrillation, chronic persistent. Failed DCCV x 3 in 2000 Diverticulosis, colon Hyperlipidemia Hypertension Erectile dysfunction Sleep disturbance Osteoarthritis Lumbar and cervical disk disease  Past Surgical History: Reviewed history from 04/25/2007 and no changes required. 1950's  Dislocated shoulder 8/99      Right knee arthroscopy [facility]) 11/03    [facility] chelitis ([facility]) 12/05    Left shoulder hemiarthroplasty 2/06      Right shoulder surgery  Family History: Reviewed history from 05/05/2009 and no changes required. HTN strong in family--both parents Mom died of stomach cancer Dad died of stroke 9 siblings--4 have died (1 in  WW2, 2 other brothers when much older)  Social History: Reviewed history from 11/05/2007 and no changes required. 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  NAD Head:  no sinus tenderness Ears:  R ear normal and L ear normal.   Nose:  mild inflammation Mouth:  no erythema and no exudates.   Neck:  supple, no masses, and no cervical lymphadenopathy.   Lungs:  normal respiratory effort, no intercostal retractions, and no accessory muscle use.  Mild prolonged exp phase with mild exp wheezing no dullness or crackles Additional Exam:  Spirometry normal or mild obstruction   Impression & Recommendations:  Problem # 1:  BRONCHITIS- ACUTE (ICD-466.0) Assessment New  with the wheezing and lower resp involvement, I am concerned about atypical will treat with azithromycin  His updated medication list for this problem includes:    Azithromycin 250 Mg Tabs (Azithromycin) .Marland Kitchen... 2 tabs today, then 1 tab daily for the next 4 days for bronchitis  Problem # 2:  WHEEZING (ICD-786.07) Assessment: New  appears to have inflammation from the resp infection will give short course of prednisone  Orders: Spirometry w/Graph (94010)  Complete Medication List: 1)  Alprazolam 1 Mg Tabs (Alprazolam) .Marland Kitchen.. 1 tablet by  mouth twice a day as needed 2)  Hydrochlorothiazide 25 Mg Tabs (Hydrochlorothiazide) .... Take 1 tablet by mouth every morning 3)  Coumadin 5 Mg Tabs (Warfarin sodium) .... As directed 4)  Viagra 100 Mg Tabs (Sildenafil citrate) .... Take one by mouth as needed 5)  Hydrocodone-acetaminophen 5-500 Mg Tabs (Hydrocodone-acetaminophen) .... Take 1 by mouth three times a day as needed pain 6)  Tenormin 25 Mg  Tabs (Atenolol) .... Take one by mouth daily 7)  Zolpidem Tartrate 5 Mg Tabs (Zolpidem tartrate) .Marland Kitchen.. 1 at bedtime as needed to help sleep 8)  Zantac 150 Mg Tabs (Ranitidine hcl) .... Takes one tablet daily 9)  Flexeril 10 Mg Tabs (Cyclobenzaprine hcl) .... Pt not sure of dose and strength 10)  Potassium Tabs (Potassium tabs) .... Take one by mouth daily 11)  Multivitamins Tabs (Multiple vitamin) .Marland Kitchen.. 1 by mouth daily 12)  Vitamin C 500 Mg Tabs (Ascorbic acid) .Marland Kitchen.. 1 by mouth daily 13)  Azithromycin 250 Mg Tabs (Azithromycin) .... 2 tabs today, then 1 tab daily for the next 4 days for bronchitis 14)  Prednisone 20 Mg Tabs (Prednisone) .... 2 tabs  daily for 5 days and then 1 tab daily for 5 days  Patient Instructions: 1)  Keep regular appt 2)  Call if breathing is not better by the end of the week Prescriptions: PREDNISONE 20 MG TABS (PREDNISONE) 2 tabs  daily for 5 days and then 1 tab daily for 5 days  #15 x 0   Entered and Authorized by:   Cindee Salt MD   Signed by:   Cindee Salt MD on 04/06/2010   Method used:   [facility]        [id number]       [address]       [place], [place]  [address]       Ph: [phone] or [phone]       Fax: [phone]   RxID:   [id number] AZITHROMYCIN 250 MG TABS (AZITHROMYCIN) 2 tabs today, then 1 tab daily for the next 4 days for bronchitis  #6 x 0   Entered and Authorized by:   Cindee Salt MD   Signed by:   Cindee Salt MD on 04/06/2010   Method used:   Electronically to        [id number] ([facility]       [address]       [place], [place]  [address]       Ph: [phone] or [phone]       Fax: [phone]   RxID:   [id number]   Current Allergies (reviewed today): * BEXTRA (VALDECOXIB) PERCOCET (OXYCODONE-ACETAMINOPHEN)

## 2010-12-21 NOTE — Progress Notes (Signed)
Summary: Rx Alprazolam  Phone Note Refill Request Call back at [phone] Message from:  [facility] on January 26, 2010 2:24 PM  Refills Requested: Medication #1:  ALPRAZOLAM 1 MG TABS 1 tablet by mouth twice a day as needed   Last Refilled: 12/23/2009 Received faxed refill request, please advise.   Method Requested: Telephone to Pharmacy Initial call taken by: Linde Gillis CMA Duncan Dull),  January 26, 2010 2:25 PM    Prescriptions: ALPRAZOLAM 1 MG TABS (ALPRAZOLAM) 1 tablet by mouth twice a day as needed  #60 x 0   Entered and Authorized by:   Shaune Leeks MD   Signed by:   Shaune Leeks MD on 01/26/2010   Method used:   Telephoned to ...       [id number]       [address]       [place], [place]  [address]       Ph: [phone] or [phone]       Fax: [phone]   RxID:   [id number]   Appended Document: Rx Alprazolam Rx called into [facility]

## 2010-12-21 NOTE — Progress Notes (Signed)
Summary: refill request for vicodin  Phone Note Refill Request Message from:  Fax from Pharmacy  Refills Requested: Medication #1:  HYDROCODONE-ACETAMINOPHEN 5-500 MG TABS Take 1 by mouth four times daily as needed   Last Refilled: 06/29/2010 Faxed request from [facility] is on your desk.  Initial call taken by: Lowella Petties CMA,  August 12, 2010 4:47 PM  Follow-up for Phone Call        okay #90 x 1 Follow-up by: Cindee Salt MD,  August 13, 2010 8:11 AM  Additional Follow-up for Phone Call Additional follow up Details #1::        Rx called to pharmacy Additional Follow-up by: Linde Gillis CMA [facility] Dull),  August 13, 2010 8:14 AM    Prescriptions: HYDROCODONE-ACETAMINOPHEN 5-500 MG TABS (HYDROCODONE-ACETAMINOPHEN) Take 1 by mouth four times daily as needed  #90 x 1   Entered by:   Linde Gillis CMA (AAMA)   Authorized by:   Cindee Salt MD   Signed by:   Linde Gillis CMA (AAMA) on 08/13/2010   Method used:   Telephoned to ...       [id number]       [address]       [place], [place]  [address]       Ph: [phone] or [phone]       Fax: [phone]   RxID:   [id number]

## 2010-12-21 NOTE — Progress Notes (Signed)
Summary: vicodin refill request  Phone Note Refill Request Message from:  Fax from Pharmacy  Refills Requested: Medication #1:  HYDROCODONE-ACETAMINOPHEN 5-500 MG TABS 1 two times a day as needed for pain   Last Refilled: 06/11/2008 [facility] [address]  Initial call taken by: Lowella Petties,  July 16, 2008 2:56 PM  Follow-up for Phone Call        okay #60 x 1 Follow-up by: Cindee Salt MD,  July 17, 2008 1:16 PM  Additional Follow-up for Phone Call Additional follow up Details #1::        PRESCRIB. FAXED TO PHARMACY// Additional Follow-up by: Providence Crosby,  July 17, 2008 1:53 PM      Prescriptions: HYDROCODONE-ACETAMINOPHEN 5-500 MG TABS (HYDROCODONE-ACETAMINOPHEN) 1 two times a day as needed for pain  #60 x 1   Entered by:   Providence Crosby   Authorized by:   Cindee Salt MD   Signed by:   Providence Crosby on 07/17/2008   Method used:   Print then Give to Patient   RxID:   [id number]

## 2010-12-21 NOTE — Progress Notes (Signed)
Summary: Hydrocodone/Acetaminophen  Phone Note Refill Request Message from:  Fax from Pharmacy on November 27, 2009 4:24 PM  Refills Requested: Medication #1:  HYDROCODONE-ACETAMINOPHEN 5-500 MG TABS 1 two times a day as needed for pain [facility]  Phone:   [phone]   Method Requested: Telephone to Pharmacy Initial call taken by: Delilah Shan CMA [facility] Dull),  November 27, 2009 4:25 PM  Follow-up for Phone Call        ok to call in 30 tablets, no refills. Take 1-2 tablets every 6 hours as needed for pain. Follow-up by: Ruthe Mannan MD,  November 27, 2009 4:29 PM  Additional Follow-up for Phone Call Additional follow up Details #1::        Medication phoned to pharmacy.   #30 with 0 RF.  The entry for #60 with 1 RF is incorrect. Additional Follow-up by: Delilah Shan CMA (AAMA),  November 27, 2009 5:08 PM    Prescriptions: HYDROCODONE-ACETAMINOPHEN 5-500 MG TABS (HYDROCODONE-ACETAMINOPHEN) 1 two times a day as needed for pain  #30 x 0   Entered by:   Delilah Shan CMA (AAMA)   Authorized by:   Ruthe Mannan MD   Signed by:   Delilah Shan CMA (AAMA) on 11/27/2009   Method used:   Handwritten   RxID:   [id number] HYDROCODONE-ACETAMINOPHEN 5-500 MG TABS (HYDROCODONE-ACETAMINOPHEN) 1 two times a day as needed for pain  #60 x 1   Entered by:   Delilah Shan CMA (AAMA)   Authorized by:   Ruthe Mannan MD   Signed by:   Delilah Shan CMA (AAMA) on 11/27/2009   Method used:   Handwritten   RxID:   [id number]

## 2010-12-21 NOTE — Progress Notes (Signed)
Summary: refill request for ambien  Phone Note Refill Request Message from:  Fax from Pharmacy  Refills Requested: Medication #1:  ZOLPIDEM TARTRATE 5 MG TABS 1 at bedtime as needed to help sleep   Last Refilled: 12/29/2009 [facility] [facility] on [facility].  Initial call taken by: Lowella Petties CMA,  March 18, 2010 11:12 AM  Follow-up for Phone Call        okay #90 x 1 Follow-up by: Cindee Salt MD,  March 18, 2010 1:08 PM  Additional Follow-up for Phone Call Additional follow up Details #1::        Rx faxed to pharmacy Additional Follow-up by: DeShannon Smith CMA [facility]ull),  March 18, 2010 3:07 PM    Prescriptions: ALPRAZOLAM 1 MG TABS (ALPRAZOLAM) 1 tablet by mouth twice a day as needed  #60 x 1   Entered by:   Mervin Hack CMA (AAMA)   Authorized by:   Cindee Salt MD   Signed by:   Mervin Hack CMA (AAMA) on 03/18/2010   Method used:   Handwritten   RxID:   [id number]

## 2010-12-21 NOTE — Progress Notes (Signed)
Summary: Rx Alprazolam  Phone Note Refill Request Call back at [phone] Message from:  [facility] on November 25, 2009 9:06 AM  Refills Requested: Medication #1:  ALPRAZOLAM 1 MG TABS 1 tablet by mouth twice a day as needed   Last Refilled: 10/27/2009  Medication #2:  HYDROCODONE-ACETAMINOPHEN 5-500 MG TABS 1 two times a day as needed for pain   Last Refilled: 10/27/2009 Received faxed refill request, please advise   Method Requested: Electronic Initial call taken by: Linde Gillis CMA [facility] Dull),  November 25, 2009 9:06 AM  Follow-up for Phone Call        Precription in my box to fax or have pt pick up. Follow-up by: Ruthe Mannan MD,  November 25, 2009 10:24 AM    Prescriptions: HYDROCHLOROTHIAZIDE 25 MG TABS (HYDROCHLOROTHIAZIDE) Take 1 tablet by mouth every morning  #90 x 0   Entered and Authorized by:   Ruthe Mannan MD   Signed by:   Ruthe Mannan MD on 11/25/2009   Method used:   Print then Give to Patient   RxID:   [id number] ALPRAZOLAM 1 MG TABS (ALPRAZOLAM) 1 tablet by mouth twice a day as needed  #60 x 0   Entered and Authorized by:   Ruthe Mannan MD   Signed by:   Ruthe Mannan MD on 11/25/2009   Method used:   Print then Give to Patient   RxID:   [id number]   Appended Document: Rx Alprazolam Medication phoned to pharmacy.

## 2010-12-21 NOTE — Progress Notes (Signed)
Summary: refill request for vicodin, xanax  Phone Note Refill Request Message from:  Fax from Pharmacy  Refills Requested: Medication #1:  HYDROCODONE-ACETAMINOPHEN 5-500 MG TABS Take 1 by mouth four times daily as needed   Last Refilled: 05/17/2010  Medication #2:  ALPRAZOLAM 1 MG TABS 1 tablet by mouth twice a day as needed   Last Refilled: 05/27/2010 Faxed request from [facility], [phone], is on your desk.  Initial call taken by: Lowella Petties CMA,  June 28, 2010 2:56 PM  Follow-up for Phone Call        okay alprazolam #60 x 1 hydrocodone #90 x 0 Follow-up by: Cindee Salt MD,  June 28, 2010 5:40 PM  Additional Follow-up for Phone Call Additional follow up Details #1::        Rx called to pharmacy Additional Follow-up by: Sydell Axon LPN,  June 28, 2010 6:02 PM    Prescriptions: HYDROCODONE-ACETAMINOPHEN 5-500 MG TABS (HYDROCODONE-ACETAMINOPHEN) Take 1 by mouth four times daily as needed  #90 x 0   Entered by:   Sydell Axon LPN   Authorized by:   Cindee Salt MD   Signed by:   Sydell Axon LPN on 45/40/9811   Method used:   Telephoned to ...       [id number]       [address]       [place], [place]  [address]       Ph: [phone] or [phone]       Fax: [phone]   RxID:   [id number] ALPRAZOLAM 1 MG TABS (ALPRAZOLAM) 1 tablet by mouth twice a day as needed  #60 x 1   Entered by:   Sydell Axon LPN   Authorized by:   Cindee Salt MD   Signed by:   Sydell Axon LPN on 38/75/6433   Method used:   Telephoned to ...       [id number]       [address]       [place], [place]  [address]       Ph: [phone] or [phone]       Fax: [phone]   RxID:   [id number]

## 2010-12-21 NOTE — Assessment & Plan Note (Signed)
Summary: F1Y/AMD  Medications Added NEURONTIN 300 MG CAPS (GABAPENTIN) one tablet three times a day      Allergies Added:   Visit Type:  Follow-up Referring Provider:  Roxy Manns, MD Primary Provider:  Roxy Manns, MD  CC:  Denies chest pain or shortness of breath..  History of Present Illness: 76 year-old gentleman with chronic  atrial fibrillation and HTN, remote hx of  DC cardioversion that was unsuccessful, on rate-control and anticoagulation for the past several years.  The patient is very active. He works hard with yardwork in the Spring and Summer months. He works out at [employer] in the Winter months. He typically exercises for 20 min on the treadmill and 20 min on the exercise bike.  he reports having a bulging disc and has severe neck pain. He also has a history of bilateral shoulder surgery, replacement. He has a history of arthroscopic knee surgery on the right and feels that his right knee is having problems again.  He denies chest pain, shortness of breath, edema, or palpitations. no lightheadedness or near syncope.  EKG shows atrial ablation with ventricular rate of 60 with occasional pauses of up to 2 seconds, no significant ST or T wave changes.     Current Medications (verified): 1)  Alprazolam 1 Mg Tabs (Alprazolam) .Marland Kitchen.. 1 Tablet By Mouth Twice A Day As Needed 2)  Hydrochlorothiazide 25 Mg Tabs (Hydrochlorothiazide) .... Take 1 Tablet By Mouth Every Morning 3)  Coumadin 5 Mg Tabs (Warfarin Sodium) .... As Directed 4)  Viagra 100 Mg Tabs (Sildenafil Citrate) .... Take One By Mouth As Needed 5)  Hydrocodone-Acetaminophen 5-500 Mg Tabs (Hydrocodone-Acetaminophen) .... Take 1 By Mouth Four Times Daily As Needed 6)  Tenormin 25 Mg Tabs (Atenolol) .... Take One By Mouth Daily 7)  Zolpidem Tartrate 5 Mg Tabs (Zolpidem Tartrate) .Marland Kitchen.. 1 At Bedtime As Needed To Help Sleep 8)  Zantac 150 Mg Tabs (Ranitidine Hcl) .... Takes One Tablet Daily 9)  Potassium  Tabs (Potassium  Tabs) .... Take One By Mouth Daily 10)  Multivitamins  Tabs (Multiple Vitamin) .Marland Kitchen.. 1 By Mouth Daily 11)  Vitamin C 500 Mg Tabs (Ascorbic Acid) .Marland Kitchen.. 1 By Mouth Daily 12)  Neurontin 300 Mg Caps (Gabapentin) .... One Tablet Three Times A Day  Allergies (verified): 1)  * Bextra (Valdecoxib) 2)  Percocet (Oxycodone-Acetaminophen)  Past History:  Past Medical History: Last updated: 05/04/2010 Allergic rhinitis Atrial fibrillation, chronic persistent. 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    Left shoulder hemiarthroplasty 2/06      Right shoulder surgery  Family History: Last updated: 05/05/2009 HTN strong in family--both parents Mom died of stomach cancer Dad died of stroke 9 siblings--4 have died (1 in WW2, 2 other brothers when much older)  Social History: Last updated: 11/05/2007 Marital Status: Separated 1997, Remarried 07/1999 Children: 2 daughters Occupation: Retired, Administrator, 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  Knee and neck pain  Vital Signs:  Patient profile:   76 year old male Height:      66 inches Weight:      171 pounds BMI:     27.70 Pulse rate:   56 / minute BP sitting:   125 / 79  (left arm) Cuff size:    regular  Vitals Entered By: Bishop Dublin, CMA (July 29, 2010 11:27 AM)  Physical Exam  General:  alert and normal appearance.   Head:  no sinus tenderness Neck:  Neck supple, no JVD. No masses, thyromegaly or abnormal cervical nodes. Lungs:  Clear bilaterally to auscultation and percussion. Heart:  Non-displaced PMI, chest non-tender; irregular rate and rhythm, S1, S2 with II/VI SEM RSB, no rubs or gallops. Carotid upstroke normal, no bruit.  Pedals normal pulses. No edema, no varicosities. Abdomen:  abdomen soft and non-tender without masses Msk:  Back normal, normal gait. Muscle strength and tone normal. Pulses:  pulses normal in all 4 extremities Extremities:  No clubbing or cyanosis. Neurologic:  Alert and oriented x 3. Skin:  Intact without lesions or rashes. Psych:  Normal affect.   Impression & Recommendations:  Problem # 1:  ATRIAL FIBRILLATION (ICD-427.31) chronic atrial fibrillation on warfarin. Adequate rate control on atenolol. We will monitor him for bradycardia at which time we could cut his atenolol in half.  he has indicated that he would like to had neck surgery in the near future. We could possibly have him bridge his time off warfarin with Lovenox.  His updated medication list for this problem includes:    Coumadin 5 Mg Tabs (Warfarin sodium) .Marland Kitchen... As directed    Tenormin 25 Mg Tabs (Atenolol) .Marland Kitchen... Take one by mouth daily  Problem # 2:  HYPERTENSION (ICD-401.9) Blood pressure is well controlled on his current medication regimen.  His updated medication list for this problem includes:    Hydrochlorothiazide 25 Mg Tabs (Hydrochlorothiazide) .Marland Kitchen... Take 1 tablet by mouth every morning    Tenormin 25 Mg Tabs (Atenolol) .Marland Kitchen... Take one by mouth daily  Problem # 3:  HYPERLIPIDEMIA (ICD-272.4) Total cholesterol of 190. No known coronary artery disease, and no significant family history of this father did have a stroke in his late 62s. He reports that his  father ate a very poor diet. we have encouraged him to continue exercising and watching his diet.  Other Orders: EKG w/ Interpretation (93000)  Patient Instructions: 1)  Your physician recommends that you continue on your current medications as directed. Please refer to the Current Medication list given to you today. 2)  Your physician wants you to follow-up in:  1 year  You will receive a reminder letter in the mail two months in advance. If you don't receive a letter, please call our office to schedule the follow-up appointment.

## 2010-12-21 NOTE — Progress Notes (Signed)
Summary: Rx-Alprazolam  Phone Note Refill Request Message from:  Fax from Pharmacy on May 28, 2008 9:30 AM  Refills Requested: Medication #1:  ALPRAZOLAM 1 MG TABS 1 tablet by mouth twice a day   Last Refilled: 04/25/2008 #[id number] [phone]  Initial call taken by: Silas Sacramento,  May 28, 2008 9:30 AM  Follow-up for Phone Call        okay #60 x 1 Follow-up by: Cindee Salt MD,  May 28, 2008 1:50 PM  Additional Follow-up for Phone Call Additional follow up Details #1::        faxed in Rx to pharmacy and updated med list Additional Follow-up by: Silas Sacramento,  May 28, 2008 2:03 PM      Prescriptions: ALPRAZOLAM 1 MG TABS (ALPRAZOLAM) 1 tablet by mouth twice a day  #60 x 1   Entered by:   Silas Sacramento   Authorized by:   Cindee Salt MD   Signed by:   Silas Sacramento on 05/28/2008   Method used:   Telephoned to ...       [facility] [employer] [phone]*       [address]       [place], [place]  [address]       Ph: [phone] or [phone]       Fax: [phone]   RxID:   [id number]  faxed to pharmacy

## 2010-12-21 NOTE — Progress Notes (Signed)
  Phone Note Call from Patient Call back at [facility] Phone [phone] Call back at [phone] Phone   Caller: Patient Reason for Call: Talk to Doctor Summary of Call: Pt seen yesterday. It was decided that he had allergies but today he is much worse and is running fever. He says you said if this happened that you would prescribed Anti-biotic. He wanted to speak directly to you but I told him I would give you the message and you would take care of it. [facility] [address] [facility] [place]. Initial call taken by: Mickle Asper,  August 31, 2007 3:34 PM  Follow-up for Phone Call        will start him on ceftin for 7d two times a day,,,and call to drug store and patient ..................................................................Marland KitchenBillie-Lynn Tyler Deis FNP  August 31, 2007 5:23 PM

## 2010-12-21 NOTE — Progress Notes (Signed)
Summary: regarding pinched nerve  Phone Note Call from Patient   Caller: Patient Summary of Call: Pt has been diagnosed with a pinched nerve by his orthopaedic surgeon.  He went to three weeks of therapy and was put  on neurontin.  He said his pain in his neck and head is getting worse.  He is asking if he should go back to his ortho surgeon or should he be referred to a neurologist.  I advised him to check with his surgeon and see what he advises that he do. Initial call taken by: Lowella Petties CMA,  October 12, 2009 10:15 AM  Follow-up for Phone Call        correct Would not need neurologist but if surgery was being considered, he might need referral to neurosurgeon. The orthopedist should be able to handle this Follow-up by: Cindee Salt MD,  October 12, 2009 10:41 AM

## 2010-12-21 NOTE — Progress Notes (Signed)
Summary:  HYDROCODONE-ACETAMINOPHEN Gerald Leitz  Phone Note Refill Request Message from:  [id number] on September 25, 2009 10:25 AM  Refills Requested: Medication #1:  HYDROCODONE-ACETAMINOPHEN 5-500 MG TABS 1 two times a day as needed for pain   Last Refilled: 08/27/2009  Medication #2:  ALPRAZOLAM 1 MG TABS 1 tablet by mouth twice a day as needed   Last Refilled: 08/27/2009 Form on your desk    Method Requested: Fax to Local Pharmacy Initial call taken by: DeShannon Smith CMA Duncan Dull),  September 25, 2009 10:25 AM  Follow-up for Phone Call        okay #60 x 1 for each Follow-up by: Cindee Salt MD,  September 25, 2009 10:38 AM  Additional Follow-up for Phone Call Additional follow up Details #1::        Rx faxed to pharmacy Additional Follow-up by: DeShannon Smith CMA (AAMA),  September 25, 2009 1:18 PM    Prescriptions: HYDROCODONE-ACETAMINOPHEN 5-500 MG TABS (HYDROCODONE-ACETAMINOPHEN) 1 two times a day as needed for pain  #60 x 1   Entered by:   Mervin Hack CMA (AAMA)   Authorized by:   Cindee Salt MD   Signed by:   Mervin Hack CMA (AAMA) on 09/25/2009   Method used:   Handwritten   RxID:   [id number] ALPRAZOLAM 1 MG TABS (ALPRAZOLAM) 1 tablet by mouth twice a day as needed  #60 x 1   Entered by:   Mervin Hack CMA (AAMA)   Authorized by:   Cindee Salt MD   Signed by:   Mervin Hack CMA (AAMA) on 09/25/2009   Method used:   Handwritten   RxID:   [id number]

## 2010-12-21 NOTE — Progress Notes (Signed)
Summary: Zolpidem Tartrate/ Xanax/ Hydrocodone  Phone Note [facility]ill Request Message [id number] on July 28, 2009 10:43 AM  Refills Requested: Medication #1:  ZOLPIDEM TARTRATE 5 MG TABS 1 at bedtime as needed to help sleep  Medication #2:  ALPRAZOLAM 1 MG TABS 1 tablet by mouth twice a day as needed   Last Refilled: 06/26/2009  Medication #3:  HYDROCODONE-ACETAMINOPHEN 5-500 MG TABS 1 two times a day as needed for pain   Last Refilled: 06/26/2009 Form on your desk    Method Requested: Telephone to Pharmacy Initial call taken by: Delilah Shan CMA (AAMA),  July 28, 2009 10:44 AM  Follow-up for Phone Call        okay 1 month x 1 refill of each zolpidem form not here Follow-up by: Cindee Salt MD,  July 28, 2009 1:49 PM  Additional Follow-up for Phone Call Additional follow up Details #1::        Rx called to pharmacy, Rx faxed to pharmacy Additional Follow-up by: DeShannon Katrinka Blazing CMA (AAMA),  July 28, 2009 2:40 PM    Prescriptions: ZOLPIDEM TARTRATE 5 MG TABS (ZOLPIDEM TARTRATE) 1 at bedtime as needed to help sleep  #90 x 1   Entered by:   Mervin Hack CMA (AAMA)   Authorized by:   Cindee Salt MD   Signed by:   Mervin Hack CMA (AAMA) on 07/28/2009   Method used:   Telephoned to ...       [id number] ([facility]ail)       [address]       [place], [place]  [address]       Ph: [phone] or [phone]       Fax: [phone]   RxID:   [id number] ALPRAZOLAM 1 MG TABS (ALPRAZOLAM) 1 tablet by mouth twice a day as needed  #60 x 1   Entered by:   Mervin Hack CMA (AAMA)   Authorized by:   Cindee Salt MD   Signed by:   Mervin Hack CMA (AAMA) on 07/28/2009   Method used:   Handwritten   RxID:   [id number] HYDROCODONE-ACETAMINOPHEN 5-500 MG TABS (HYDROCODONE-ACETAMINOPHEN) 1 two times a day as needed for pain  #60 x 1   Entered by:   Mervin Hack CMA (AAMA)   Authorized by:    Cindee Salt MD   Signed by:   Mervin Hack CMA (AAMA) on 07/28/2009   Method used:   Handwritten   RxID:   [id number]

## 2010-12-21 NOTE — Progress Notes (Signed)
  Phone Note [id number]  Refills Requested: Medication #1:  HYDROCODONE-ACETAMINOPHEN 5-500 MG TABS as needed.  Method Requested: Fax to Local Pharmacy Initial call taken by: Wandra Mannan,  November 02, 2007 9:47 AM  Follow-up for Phone Call        okay #60 x 0 Follow-up by: Cindee Salt MD,  November 02, 2007 12:55 PM  Additional Follow-up for Phone Call Additional follow up Details #1::        rx faxed Additional Follow-up by: Wandra Mannan,  November 02, 2007 12:57 PM    New/Updated Medications: HYDROCODONE-ACETAMINOPHEN 5-500 MG TABS (HYDROCODONE-ACETAMINOPHEN) 1 two times a day as needed for pain   Prescriptions: HYDROCODONE-ACETAMINOPHEN 5-500 MG TABS (HYDROCODONE-ACETAMINOPHEN) 1 two times a day as needed for pain  #60 x 0   Entered and Authorized by:   Cindee Salt MD   Signed by:   Cindee Salt MD on 11/02/2007   Method used:   Telephoned to ...         RxID:   [id number]

## 2010-12-21 NOTE — Progress Notes (Signed)
Summary: Oral surgeon/request PT/INR  Phone Note From Other Clinic Call back at [phone]   Caller: Jessica/Dr. Carter's office Call For: Dr. Alphonsus Sias Summary of Call: Patient is there to have oral surgery and they needs a copy of his last PT and INR results. Fax [phone].  Leretha Dykes a medical release form to have patient sign and fax back to the office. Initial call taken by: Sydell Axon LPN,  October 04, 2010 3:10 PM  Follow-up for Phone Call        Received signed medical release. Last PT/INR results faxed as requested. Follow-up by: Sydell Axon LPN,  October 04, 2010 3:35 PM  Additional Follow-up for Phone Call Additional follow up Details #1::        okay Additional Follow-up by: Cindee Salt MD,  October 04, 2010 4:09 PM

## 2010-12-21 NOTE — Assessment & Plan Note (Signed)
Summary: STAPH INFECTION R LEG   Vital Signs:  Patient Profile:   76 Years Old Male Height:     68 inches Weight:      164 pounds Temp:     97.9 degrees F oral Pulse rate:   64 / minute BP sitting:   128 / 78  (left arm)  Vitals Entered By: Wandra Mannan (June 04, 2007 12:34 PM)               Chief Complaint:  ? staff infec r leg and med refill.  History of Present Illness: Got infection on R leg now  Pain persisted after last visit and he went to [facility] urgent care. diagnosed with disc rupture---gave pain shot and Rx for pain med and prednisone This is better now  They started him on antibiotic for apparent infection on anterior calf but it is not improving. 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 1 three times a day for 2 weeks   Medications Added to Medication List This Visit: 1)  Ambien 5 Mg Tabs (Zolpidem tartrate) 2)  Cleocin 300 Mg Caps (Clindamycin hcl) .Marland Kitchen.. 1 three times a day for 2 weeks   Patient Instructions: 1)  Please schedule a follow-up appointment as needed.    Prescriptions: CLEOCIN 300 MG  CAPS (CLINDAMYCIN HCL) 1 three times a day for 2 weeks  #42 x 0   Entered and Authorized by:   Cindee Salt  MD   Signed by:   Cindee Salt MD on 06/04/2007   Method used:   Print then Give to Patient   RxID:   [id number] AMBIEN 5 MG  TABS (ZOLPIDEM TARTRATE)   #90 x 3   Entered and Authorized by:   Cindee Salt MD   Signed by:   Cindee Salt MD on 06/04/2007   Method used:   Print then Give to Patient   RxID:   [id number] ALPRAZOLAM 1 MG TABS (ALPRAZOLAM) 1 tablet by mouth twice a day  #60 x 1   Entered and Authorized by:   Cindee Salt MD   Signed by:   Cindee Salt MD on 06/04/2007   Method used:   Print then Give to Patient   RxID:   [id number]     Current Allergies: * BEXTRA (VALDECOXIB) PERCOCET (OXYCODONE-ACETAMINOPHEN) Current Medications (including changes made in today's visit):  ALPRAZOLAM 1 MG TABS (ALPRAZOLAM) 1 tablet by mouth twice a day HYDROCHLOROTHIAZIDE 25 MG TABS (HYDROCHLOROTHIAZIDE) Take 1 tablet by mouth every morning AMBIEN 5 MG  TABS (ZOLPIDEM TARTRATE)  COUMADIN 5 MG TABS (WARFARIN SODIUM) As directed MULTIVITAMINS  TABS (MULTIPLE VITAMIN) 1 by mouth daily PRILOSEC OTC  TBEC (OMEPRAZOLE MAGNESIUM TBEC) as needed MOBIC 15 MG TABS (MELOXICAM) 1 by mouth daily VITAMIN C 500 MG TABS (ASCORBIC ACID) 1 by mouth daily POTASSIUM  TABS (POTASSIUM TABS) daily CLARITIN  TABS (LORATADINE TABS) as needed VIAGRA 100 MG TABS (SILDENAFIL CITRATE) as needed HYDROCODONE-ACETAMINOPHEN 5-500 MG TABS (HYDROCODONE-ACETAMINOPHEN) as needed CLEOCIN 300 MG  CAPS (CLINDAMYCIN HCL) 1 three times a day for 2 weeks

## 2010-12-21 NOTE — Progress Notes (Signed)
Summary: refill request for gen Remus Loffler  Phone Note From Pharmacy Message from:  Fax from Pharmacy  [facility] [address] Call For: dr Alphonsus Sias  Summary of Call: pt requests refill on gen Remus Loffler be called to [facility], not on med list Initial call taken by: Lowella Petties,  July 21, 2008 4:44 PM  Follow-up for Phone Call        okay 5mg  #30 x 1 Follow-up by: Cindee Salt MD,  July 21, 2008 5:01 PM  Additional Follow-up for Phone Call Additional follow up Details #1::        [facility] [address] Additional Follow-up by: Lowella Petties,  July 21, 2008 5:04 PM      Appended Document: refill request for gen Remus Loffler please call and change to 90 x 1 he will probably be upset if only 30--I didn't see the fax  Appended Document: refill request for gen [facility] notified as instructed.

## 2010-12-21 NOTE — Assessment & Plan Note (Signed)
Summary: FOLLOW UP/EVE   Vital Signs:  Patient Profile:   76 Years Old Male Height:     68 inches Weight:      167.13 pounds Temp:     97.5 degrees F oral Pulse rate:   76 / minute BP sitting:   140 / 86  (left arm)  Vitals Entered By: Wandra Mannan (April 27, 2007 8:01 AM)               Chief Complaint:  6 mo fu.  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 !/4 accre garden--keeps him busy  Hypertension History:      He denies chest pain, palpitations, dyspnea with exertion, peripheral edema, syncope, and side effects from treatment.        Positive major cardiovascular risk factors include male age 69 years old or older, hyperlipidemia, and hypertension.  Negative major cardiovascular risk factors include non-tobacco-user status.     Current Allergies: * BEXTRA (VALDECOXIB) PERCOCET (OXYCODONE-ACETAMINOPHEN)     Review of Systems      See HPI       Sleeps okay with xanax--usually 1/night nocturia Mood is good--as long as he stays busy Occ xanax in day---only uses for stress or to help him relax Appetite fine Asks about Rx for toenails but has no pain   Physical Exam  General:     alert and normal appearance.   Neck:     supple, no masses, no thyromegaly, no carotid bruits, and no cervical lymphadenopathy.   Lungs:     normal respiratory effort and normal breath sounds.   Heart:     normal rate, no murmur, no gallop, and irregular rhythm.   Pulses:     1+ in feet Extremities:     No edema Neurologic:     strength normal in all extremities.   Psych:     normally interactive, good eye contact, not anxious appearing, and not depressed appearing.      Impression & Recommendations:  Problem # 1:  ATRIAL FIBRILLATION (ICD-427.31) Assessment: Unchanged  His updated medication list for this problem includes:   Atenolol 25 Mg Tabs (Atenolol) .Marland Kitchen... 1 by mouth daily    Coumadin 5 Mg Tabs (Warfarin sodium) .Marland Kitchen... As directed Good rate control asymptomatic Monitoring protimes  Problem # 2:  HYPERTENSION (ICD-401.9) Assessment: Unchanged  His updated medication list for this problem includes:    Hydrochlorothiazide 25 Mg Tabs (Hydrochlorothiazide) .Marland Kitchen... Take 1 tablet by mouth every morning    Atenolol 25 Mg Tabs (Atenolol) .Marland Kitchen... 1 by mouth daily Control is good Orders: TLB-Renal Function Panel (80069-RENAL)   Problem # 3:  HYPERLIPIDEMIA (ICD-272.4) Assessment: Unchanged Better now that he is in  better shape  Problem # 4:  PSA, INCREASED (ICD-790.93) Assessment: Unchanged will recheck Orders: TLB-PSA (Prostate Specific Antigen) (84153-PSA)   Problem # 5:  ALLERGIC RHINITIS (ICD-477.9) Assessment: Unchanged  His updated medication list for this problem includes:    Claritin Tabs (Loratadine tabs) .Marland Kitchen... As needed stable  Hypertension Assessment/Plan:      The patient's hypertensive risk group is category B: At least one risk factor (excluding diabetes) with no target organ damage.  Today's blood pressure is 140/86.     Patient Instructions: 1)  Please schedule a follow-up appointment in 6 months.

## 2010-12-21 NOTE — Progress Notes (Signed)
Summary: ZOLPIDEM TARTRATE  Phone Note Refill Request Message [id number] on December 29, 2009 1:19 PM  Refills Requested: Medication #1:  ZOLPIDEM TARTRATE 5 MG TABS 1 at bedtime as needed to help sleep   Last Refilled: 10/04/2009 Form on your desk    Method Requested: Fax to Local Pharmacy Initial call taken by: DeShannon Smith CMA Duncan Dull),  December 29, 2009 1:19 PM  Follow-up for Phone Call        okay #90 x 0 seems to be a little early since 6 month supply Rx'd 5 months ago will give 1 script Follow-up by: Cindee Salt MD,  December 29, 2009 1:41 PM  Additional Follow-up for Phone Call Additional follow up Details #1::        Rx faxed to pharmacy Additional Follow-up by: DeShannon Katrinka Blazing CMA Duncan Dull),  December 29, 2009 2:11 PM    Prescriptions: ZOLPIDEM TARTRATE 5 MG TABS (ZOLPIDEM TARTRATE) 1 at bedtime as needed to help sleep  #90 x 0   Entered by:   Mervin Hack CMA (AAMA)   Authorized by:   Cindee Salt MD   Signed by:   Mervin Hack CMA (AAMA) on 12/29/2009   Method used:   Handwritten   RxID:   [id number]

## 2010-12-21 NOTE — Assessment & Plan Note (Signed)
Summary: FLU SHOT   Nurse Visit   Allergies: 1)  * Bextra (Valdecoxib) 2)  Percocet (Oxycodone-Acetaminophen)  Orders Added: 1)  Flu Vaccine 60yrs + MEDICARE PATIENTS [Q2039] 2)  Administration Flu vaccine - MCR [G0008]    Flu Vaccine Consent Questions     Do you have a history of severe allergic reactions to this vaccine? no    Any prior history of allergic reactions to egg and/or gelatin? no    Do you have a sensitivity to the preservative Thimersol? no    Do you have a past history of Guillan-Barre Syndrome? no    Do you currently have an acute febrile illness? no    Have you ever had a severe reaction to latex? no    Vaccine information given and explained to patient? yes    Are you currently pregnant? no    Lot Number:[id number]   Exp Date:05/21/2011   Manufacturer: [employer]    Site Given  Left Deltoid IM

## 2010-12-21 NOTE — Progress Notes (Signed)
Summary: alprazolam refill request  Phone Note Refill Request Message from:  Fax from Pharmacy  Refills Requested: Medication #1:  ALPRAZOLAM 1 MG TABS 1 tablet by mouth twice a day   Last Refilled: 06/29/2008 [facility] [address]  Initial call taken by: Lowella Petties,  July 29, 2008 8:41 AM      Appended Document: alprazolam refill request Prescriptions: ALPRAZOLAM 1 MG TABS (ALPRAZOLAM) 1 tablet by mouth twice a day as needed  #60 x 0   Entered by:   Sydell Axon   Authorized by:   Cindee Salt MD   Signed by:   Sydell Axon on 07/29/2008   Method used:   Telephoned to ...         RxID:   [id number]  [facility] [address] fax # [phone]

## 2010-12-21 NOTE — Letter (Signed)
Summary: [facility]  [facility]   Imported By: Lanelle Bal 07/05/2010 09:21:18  _____________________________________________________________________  External Attachment:    Type:   Image     Comment:   External Document

## 2010-12-21 NOTE — Progress Notes (Signed)
Summary: hydrocodone/alprazolam  Phone Note Refill Request Message from:  Fax from Pharmacy on October 25, 2010 11:15 AM  Refills Requested: Medication #1:  HYDROCODONE-ACETAMINOPHEN 5-500 MG TABS Take 1 by mouth four times daily as needed   Last Refilled: 09/24/2010  Medication #2:  ALPRAZOLAM 1 MG TABS 1 tablet by mouth twice a day as needed   Last Refilled: 09/25/2010 [facility] [address] [phone]. Fax is on your desk.   Initial call taken by: Melody Comas,  October 25, 2010 11:27 AM  Follow-up for Phone Call        no written for xanax on desk---okay #60 x 0  change the hydrocodone to #120 x 0 Follow-up by: Cindee Salt MD,  October 25, 2010 1:50 PM  Additional Follow-up for Phone Call Additional follow up Details #1::        Rx called to pharmacy Additional Follow-up by: DeShannon Smith CMA Duncan Dull),  October 25, 2010 2:23 PM    Prescriptions: HYDROCODONE-ACETAMINOPHEN 5-500 MG TABS (HYDROCODONE-ACETAMINOPHEN) Take 1 by mouth four times daily as needed  #120 x 0   Entered by:   Mervin Hack CMA (AAMA)   Authorized by:   Cindee Salt MD   Signed by:   Mervin Hack CMA (AAMA) on 10/25/2010   Method used:   Telephoned to ...       [id number]       [address]       [place], [place]  [address]       Ph: [phone] or [phone]       Fax: [phone]   RxID:   [id number] ALPRAZOLAM 1 MG TABS (ALPRAZOLAM) 1 tablet by mouth twice a day as needed  #60 x 0   Entered by:   Mervin Hack CMA (AAMA)   Authorized by:   Cindee Salt MD   Signed by:   Mervin Hack CMA (AAMA) on 10/25/2010   Method used:   Telephoned to ...       [id number] ([facility]ail[facility]       [address]       [place], [place]  [address]       Ph: [phone] or [phone]       Fax: [phone]   RxID:   [id number]

## 2010-12-21 NOTE — Progress Notes (Signed)
Summary: rx refill  Phone Note Call from Patient   Caller: Patient Call For: Cindee Salt MD Summary of Call: pt got a refill of hydrocodone but was only given 30 tabs he says he takes it in the am and in the afternoon after working out. request rx with increased quantity called in. He only takes as needed.  [facility] Initial call taken by: Liane Comber CMA Duncan Dull),  December 14, 2009 11:57 AM  Follow-up for Phone Call        It is noted as two times a day as needed Okay to fill #60 x 0 Follow-up by: Cindee Salt MD,  December 14, 2009 1:19 PM  Additional Follow-up for Phone Call Additional follow up Details #1::        Rx Called In, left message on machine that rx was called in. Additional Follow-up by: Mervin Hack CMA Duncan Dull),  December 14, 2009 2:59 PM    Prescriptions: HYDROCODONE-ACETAMINOPHEN 5-500 MG TABS (HYDROCODONE-ACETAMINOPHEN) 1 two times a day as needed for pain  #60 x 0   Entered by:   Mervin Hack CMA (AAMA)   Authorized by:   Cindee Salt MD   Signed by:   Mervin Hack CMA (AAMA) on 12/14/2009   Method used:   Telephoned to ...       [id number] ([facility]       [address]       [place], [place]  [address]       Ph: [phone] or [phone]       Fax: [phone]   RxID:   [id number]

## 2010-12-21 NOTE — Progress Notes (Signed)
Summary: PHI  PHI   Imported By: Harlon Flor 10/07/2009 09:43:37  _____________________________________________________________________  External Attachment:    Type:   Image     Comment:   External Document

## 2010-12-21 NOTE — Assessment & Plan Note (Signed)
Summary: 6 MONTH FOLLOW UP / LFW   Vital Signs:  Patient Profile:   76 Years Old Male Height:     68 inches Weight:      169 pounds Temp:     97.5 degrees F oral Pulse rate:   80 / minute Pulse rhythm:   irregular BP sitting:   142 / 80  (left arm) Cuff size:   regular  Vitals Entered By: Mervin Hack CMA (October 29, 2008 11:43 AM)                 Chief Complaint:  6 month follow-up.  History of Present Illness: Having drainage from right ear No sig pain sees spot of slightly colored discharge on pillow slight cold but not a big deal Sore when he tries to clean it [facility]  Having some back problems Sciatica--seen by Dr Gavin Potters Has L3 deterioration Gets daily pain which moves around to different parts of back and occ legs [facility] to improve muscle strength Hydrocodone helps this and shoulders  still works part time [employer] work decreasing but he finds tasks to do  No chest pain No plapitations No SOB or change in exercise tolerance    Current Allergies (reviewed today): * BEXTRA (VALDECOXIB) PERCOCET (OXYCODONE-ACETAMINOPHEN)  Past Medical History:    Reviewed history from 04/25/2007 and no changes required:       Allergic rhinitis       Atrial fibrillation       Diverticulosis, colon       Hyperlipidemia       Hypertension       Erectile dysfunction  Past Surgical History:    Reviewed history from 04/25/2007 and no changes required:       1950's  Dislocated shoulder       8/99      Right knee arthroscopy Gavin Potters)       11/03    Actinic chelitis (AU)       12/05    Left shoulder hemiarthroplasty       2/06      Right shoulder surgery   Social History:    Reviewed history from 11/05/2007 and no changes required:       Marital Status: Separated 1997, Remarried 07/1999       Children: 2 daughters       Occupation: Retired, Administrator, 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         still has limited abduction in shoulders appetite is fine Weight is stable sleeps reasonably well with the xanax and occ zolpidem   Physical Exam  General:     alert.   Ears:     right ear canal is edematous with mild tragal tenderness TM is normal L ear normal.   Neck:     supple, no masses, no thyromegaly, no carotid bruits, and no cervical lymphadenopathy.   Lungs:     normal respiratory effort and normal breath sounds.   Heart:     normal rate, no murmur, no gallop, and irregular rhythm.   Abdomen:     soft and non-tender.   Pulses:     2+ in feet  Extremities:     no edema Psych:     normally interactive, good eye contact, not anxious appearing, and not depressed appearing.      Impression & Recommendations:  Problem # 1:  OTITIS EXTERNA (ICD-380.10) Assessment: New will try cortisporin  then alcohol before swimming for prophylaxis His updated medication list for this problem includes:    Cortisporin-tc 3.01-21-09-0.5 Mg/ml Susp (Neomycin-colist-hc-thonzonium) .Marland KitchenMarland KitchenMarland KitchenMarland Kitchen 4 -5 drops three times a day in right ear till pain gone   Problem # 2:  ATRIAL FIBRILLATION (ICD-427.31) Assessment: Unchanged good rate control on coumadin  His updated medication list for this problem includes:    Coumadin 5 Mg Tabs (Warfarin sodium) .Marland Kitchen... As directed    Tenormin 25 Mg Tabs (Atenolol) .Marland Kitchen... Take one by mouth daily   Problem # 3:  HYPERTENSION (ICD-401.9) Assessment: Unchanged good control no changes needed  His updated medication list for this problem includes:    Hydrochlorothiazide 25 Mg Tabs (Hydrochlorothiazide) .Marland Kitchen... Take 1 tablet by mouth every morning    Tenormin 25 Mg Tabs (Atenolol) .Marland Kitchen... Take one by mouth daily  BP today: 142/80 Prior BP: 141/88 (09/08/2008)  Prior 10 Yr Risk Heart Disease: Not  enough information (04/27/2007)  Labs Reviewed: Creat: 0.8 (04/28/2008) Chol: 184 (04/28/2008)   HDL: 48.5 (04/28/2008)   LDL: 120 (04/28/2008)   TG: 77 (04/28/2008)   Problem # 4:  HYPERLIPIDEMIA (ICD-272.4) Assessment: Unchanged fine without meds  Labs Reviewed: Chol: 184 (04/28/2008)   HDL: 48.5 (04/28/2008)   LDL: 120 (04/28/2008)   TG: 77 (04/28/2008) SGOT: 31 (04/28/2008)   SGPT: 24 (04/28/2008)  Prior 10 Yr Risk Heart Disease: Not enough information (04/27/2007)   Problem # 5:  PSA, INCREASED (ICD-790.93) Assessment: Comment Only check free PSA urology if not reassuring Orders: T-PSA Free ([address]) Venipuncture ([address])   Complete Medication List: 1)  Alprazolam 1 Mg Tabs (Alprazolam) .Marland Kitchen.. 1 tablet by mouth twice a day as needed 2)  Hydrochlorothiazide 25 Mg Tabs (Hydrochlorothiazide) .... Take 1 tablet by mouth every morning 3)  Coumadin 5 Mg Tabs (Warfarin sodium) .... As directed 4)  Multivitamins Tabs (Multiple vitamin) .Marland Kitchen.. 1 by mouth daily 5)  Prilosec Otc Tbec (Omeprazole magnesium tbec) .... As needed 6)  Mobic 15 Mg Tabs (Meloxicam) .Marland Kitchen.. 1 by mouth daily 7)  Vitamin C 500 Mg Tabs (Ascorbic acid) .Marland Kitchen.. 1 by mouth daily 8)  Potassium Tabs (Potassium tabs) .... Take one by mouth daily 9)  Claritin Tabs (Loratadine tabs) .... As needed 10)  Viagra 100 Mg Tabs (Sildenafil citrate) .... Take one by mouth as needed 11)  Hydrocodone-acetaminophen 5-500 Mg Tabs (Hydrocodone-acetaminophen) .Marland Kitchen.. 1 two times a day as needed for pain 12)  Tenormin 25 Mg Tabs (Atenolol) .... Take one by mouth daily 13)  Zolpidem Tartrate 5 Mg Tabs (Zolpidem tartrate) .Marland Kitchen.. 1 at bedtime as needed to help sleep 14)  Cortisporin-tc 3.01-21-09-0.5 Mg/ml Susp (Neomycin-colist-hc-thonzonium) .... 4 -5 drops three times a day in right ear till pain gone   Patient Instructions: 1)  Please schedule a follow-up appointment in 6 months.   Prescriptions: CORTISPORIN-TC 3.01-21-09-0.5 MG/ML SUSP  (NEOMYCIN-COLIST-HC-THONZONIUM) 4 -5 drops three times a day in right ear till pain gone  #1 bottle x 3   Entered and Authorized by:   Cindee Salt MD   Signed by:   Cindee Salt MD on 10/29/2008   Method used:   [facility]        [facility] [phone] [facility] (retail)  [address] [facility]       [place], [place]  [address]       Ph: [phone] or [phone]       Fax: [phone]   RxID:   [id number]  ] Current Allergies (reviewed today): * BEXTRA (VALDECOXIB) PERCOCET (OXYCODONE-ACETAMINOPHEN)  Appended Document: Orders Update     Clinical Lists Changes  Orders: Added new Service order of [facility] [id number]) - Signed Observations: Added new observation of NEXT PT: 6 weeks (10/29/2008 12:55) Added new observation of ABNORM BLEED: No (10/29/2008 12:55) Added new observation of COUMADIN CHG: No (10/29/2008 12:55) Added new observation of INR: 2.4  (10/29/2008 12:55) Added new observation of PT PATIENT: 19.0 s (10/29/2008 12:55)      Laboratory Results   Blood Tests   Date/Time Recieved: October 29, 2008 12:56 PM  Date/Time Reported: October 29, 2008 12:56 PM   PT: 19.0 s   (Normal Range: 10.6-13.4)  INR: 2.4   (Normal Range: 0.88-1.12   Therap INR: 2.0-3.5)      ANTICOAGULATION RECORD PREVIOUS REGIMEN & LAB RESULTS Anticoagulation Diagnosis:  Atrial fibrillation on  04/28/2008 Previous INR Goal Range:  2.0-3.0 on  04/28/2008 Previous INR:  2.1 on  09/30/2008 Previous Coumadin Dose(mg):  5mg  daily on  04/28/2008 Previous Regimen:  no change on  04/28/2008 Previous Coagulation Comments:  . on  06/09/2008  NEW REGIMEN & LAB RESULTS Current INR: 2.4 Regimen: no change  (no change)  Provider: Anneliese Leblond      Repeat testing in: 6 weeks MEDICATIONS ALPRAZOLAM 1 MG TABS (ALPRAZOLAM) 1 tablet by mouth twice a day as needed HYDROCHLOROTHIAZIDE 25 MG TABS (HYDROCHLOROTHIAZIDE) Take 1 tablet by mouth every morning  COUMADIN 5 MG TABS (WARFARIN SODIUM) As directed MULTIVITAMINS  TABS (MULTIPLE VITAMIN) 1 by mouth daily PRILOSEC OTC  TBEC (OMEPRAZOLE MAGNESIUM TBEC) as needed MOBIC 15 MG TABS (MELOXICAM) 1 by mouth daily VITAMIN C 500 MG TABS (ASCORBIC ACID) 1 by mouth daily POTASSIUM  TABS (POTASSIUM TABS) Take one by mouth daily CLARITIN  TABS (LORATADINE TABS) as needed VIAGRA 100 MG TABS (SILDENAFIL CITRATE) Take one by mouth as needed HYDROCODONE-ACETAMINOPHEN 5-500 MG TABS (HYDROCODONE-ACETAMINOPHEN) 1 two times a day as needed for pain TENORMIN 25 MG TABS (ATENOLOL) take one by mouth daily ZOLPIDEM TARTRATE 5 MG TABS (ZOLPIDEM TARTRATE) 1 at bedtime as needed to help sleep CORTISPORIN-TC 3.01-21-09-0.5 MG/ML SUSP (NEOMYCIN-COLIST-HC-THONZONIUM) 4 -5 drops three times a day in right ear till pain gone   Anticoagulation Visit Questionnaire      Coumadin dose missed/changed:  No      Abnormal Bleeding Symptoms:  No Any diet changes including alcohol intake, vegetables or greens since the last visit:  No Any illnesses or hospitalizations since the last visit:  No Any signs of clotting since the last visit (including chest discomfort, dizziness, shortness of breath, arm tingling, slurred speech, swelling or redness in leg):  No

## 2010-12-21 NOTE — Letter (Signed)
Summary: Colonoscopy Letter  [facility]  [address] [place], [place] [address]   Phone: [phone]  Fax: [phone]      April 30, 2010 MRN: [id number]   Mark Padilla [address] [place], [place]  [address]   Dear [name],   According to your medical record, it is time for you to schedule a Colonoscopy. The American Cancer Society recommends this procedure as a method to detect early colon cancer. Patients with a family history of colon cancer, or a personal history of colon polyps or inflammatory bowel disease are at increased risk.  This letter has beeen generated based on the recommendations made at the time of your procedure. If you feel that in your particular situation this may no longer apply, please contact our office.  Please call our office at [phone] to schedule this appointment or to update your records at your earliest convenience.  Thank you for cooperating with [place] to provide you with the very best care possible.   Sincerely,  Judie Petit T. Russella Dar, M.D.  [facility] [phone]

## 2010-12-22 ENCOUNTER — Telehealth: Payer: Self-pay | Admitting: Internal Medicine

## 2010-12-23 ENCOUNTER — Other Ambulatory Visit (INDEPENDENT_AMBULATORY_CARE_PROVIDER_SITE_OTHER): Payer: MEDICARE

## 2010-12-23 ENCOUNTER — Encounter: Payer: Self-pay | Admitting: Internal Medicine

## 2010-12-23 DIAGNOSIS — Z5181 Encounter for therapeutic drug level monitoring: Secondary | ICD-10-CM

## 2010-12-23 DIAGNOSIS — Z7901 Long term (current) use of anticoagulants: Secondary | ICD-10-CM

## 2010-12-23 DIAGNOSIS — I4891 Unspecified atrial fibrillation: Secondary | ICD-10-CM

## 2010-12-23 LAB — CONVERTED CEMR LAB
INR: 3.2
Prothrombin Time: 38.6 s

## 2010-12-23 NOTE — Assessment & Plan Note (Signed)
Summary: 6 month follow up/rbh per terri   Vital Signs:  Patient profile:   76 year old male Weight:      168 pounds Temp:     98.4 degrees F oral BP sitting:   120 / 60  (left arm) Cuff size:   large  Vitals Entered By: Mervin Hack CMA Duncan Dull) (November 01, 2010 2:38 PM) CC: 6 month follow-up   History of Present Illness: "I hurt all over" Chronic shoulder pain Ligament issue with right knee Left hip pain and deterioration at L3 Still with bulgin disc in neck--holding off on surgery Currently more concerned about right knee and left hip--considering injections for the hip Hydrocodone does help the pain  gets headaches from the cervical disc disease at times needs 2 hydrocodone when that comes on  Still does his yard work and gardens works 4 hours every morning  No heart problems No palpitations Exercise tolerance has decreased but due to arthritic pain, not SOB No sig edema  No trouble voiding occ nocturia  Allergies: 1)  * Bextra (Valdecoxib) 2)  Percocet (Oxycodone-Acetaminophen)  Past History:  Past medical, surgical, family and social histories (including risk factors) reviewed for relevance to current acute and chronic problems.  Past Medical History: Reviewed history from 05/04/2010 and no changes required. Allergic rhinitis Atrial fibrillation, chronic persistent. Failed DCCV x 3 in 2000 Diverticulosis, colon Hyperlipidemia Hypertension Erectile dysfunction Sleep disturbance Osteoarthritis Lumbar and cervical disk disease Impaired fasting glucose  Past Surgical History: Reviewed history from 04/25/2007 and no changes required. 1950's  Dislocated shoulder 8/99      Right knee arthroscopy Gavin Potters) 11/03    Actinic chelitis (AU) 12/05    Left shoulder hemiarthroplasty 2/06      Right shoulder surgery  Family History: Reviewed history from 05/05/2009 and no changes required. HTN strong in family--both parents Mom died of stomach  cancer Dad died of stroke 9 siblings--4 have died (1 in WW2, 2 other brothers when much older)  Social History: Reviewed history from 11/05/2007 and no changes required. 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   Neck:  no masses, no thyromegaly, and no cervical lymphadenopathy.   Lungs:  normal respiratory effort, no intercostal retractions, no accessory muscle use, and normal breath sounds.   Heart:  normal rate, no murmur, no gallop, and irregular rhythm.   Abdomen:  soft and non-tender.   Msk:  marked crepitus in right knee sig decreased ROM of left hip Extremities:  no edema Skin:  2 areas on ingrown hairs in right axilla  slight irritation at opening of left auric canal Psych:  normally interactive, good eye contact, not anxious appearing, and not depressed appearing.     Impression & Recommendations:  Problem # 1:  DEGENERATIVE DISC DISEASE, CERVICAL SPINE (ICD-722.4) Assessment Comment Only ongoing pain but doing fair recommended holding off on surgery since function is okay and pain is controlled  Problem # 2:  OSTEOARTHRITIS (ICD-715.90) Assessment: Unchanged will need joint replacement eventually but holding off advised him to use tylenol first, since reluctant to take the narcotic  His updated medication list for this problem includes:    Hydrocodone-acetaminophen 5-500  Mg Tabs (Hydrocodone-acetaminophen) .Marland Kitchen... Take 1 by mouth four times daily as needed  Problem # 3:  HYPERTENSION (ICD-401.9) Assessment: Unchanged  good control no changes needed  His updated medication list for this problem  includes:    Hydrochlorothiazide 25 Mg Tabs (Hydrochlorothiazide) .Marland Kitchen... Take 1 tablet by mouth every morning    Tenormin 25 Mg Tabs (Atenolol) .Marland Kitchen... Take one by mouth daily  BP today: 120/60 Prior BP: 125/79 (07/29/2010)  Prior 10 Yr Risk Heart Disease: Not enough information (04/27/2007)  Labs Reviewed: K+: 3.9 (05/04/2010) Creat: : 0.7 (05/04/2010)   Chol: 191 (11/02/2009)   HDL: 53.80 (11/02/2009)   LDL: 128 (11/02/2009)   TG: 47.0 (11/02/2009)  Orders: TLB-Renal Function Panel (80069-RENAL) TLB-CBC Platelet - w/Differential (85025-CBCD) TLB-Hepatic/Liver Function Pnl (80076-HEPATIC) TLB-TSH (Thyroid Stimulating Hormone) (84443-TSH) Venipuncture (57[address])  Problem # 4:  ATRIAL FIBRILLATION (ICD-427.31) Assessment: Unchanged good rate control on coumadin  His updated medication list for this problem includes:    Coumadin 5 Mg Tabs (Warfarin sodium) .Marland Kitchen... As directed    Tenormin 25 Mg Tabs (Atenolol) .Marland Kitchen... Take one by mouth daily  Problem # 5:  HYPERLIPIDEMIA (ICD-272.4) Assessment: Comment Only no known CAD so holding off on Rx  Labs Reviewed: SGOT: 38 (11/02/2009)   SGPT: 32 (11/02/2009)  Prior 10 Yr Risk Heart Disease: Not enough information (04/27/2007)   HDL:53.80 (11/02/2009), 48.5 (04/28/2008)  LDL:128 (11/02/2009), 120 (04/28/2008)  Chol:191 (11/02/2009), 184 (04/28/2008)  Trig:47.0 (11/02/2009), 77 (04/28/2008)  Problem # 6:  SLEEP DISORDER (ICD-780.50) Assessment: Unchanged does okay with ambien  Complete Medication List: 1)  Alprazolam 1 Mg Tabs (Alprazolam) .Marland Kitchen.. 1 tablet by mouth twice a day as needed 2)  Hydrochlorothiazide 25 Mg Tabs (Hydrochlorothiazide) .... Take 1 tablet by mouth every morning 3)  Coumadin 5 Mg Tabs (Warfarin sodium) .... As directed 4)  Viagra 100 Mg Tabs (Sildenafil citrate) .... Take one by mouth as needed 5)  Hydrocodone-acetaminophen 5-500 Mg Tabs (Hydrocodone-acetaminophen) .... Take 1 by mouth four times daily as needed 6)   Tenormin 25 Mg Tabs (Atenolol) .... Take one by mouth daily 7)  Zolpidem Tartrate 5 Mg Tabs (Zolpidem tartrate) .Marland Kitchen.. 1 at bedtime as needed to help sleep 8)  Zantac 150 Mg Tabs (Ranitidine hcl) .... Takes one tablet daily 9)  Potassium Tabs (Potassium tabs) .... Take one by mouth daily 10)  Multivitamins Tabs (Multiple vitamin) .Marland Kitchen.. 1 by mouth daily 11)  Vitamin C 500 Mg Tabs (Ascorbic acid) .Marland Kitchen.. 1 by mouth daily 12)  Neurontin 300 Mg Caps (Gabapentin) .... One tablet three times a day  Patient Instructions: 1)  Please schedule a follow-up appointment in 6 months .    Orders Added: 1)  TLB-Renal Function Panel [80069-RENAL] 2)  TLB-CBC Platelet - w/Differential [85025-CBCD] 3)  TLB-Hepatic/Liver Function Pnl [80076-HEPATIC] 4)  TLB-TSH (Thyroid Stimulating Hormone) [84443-TSH] 5)  Venipuncture [96295] 6)  Est. Patient Level IV [28413]    Current Allergies (reviewed today): * BEXTRA (VALDECOXIB) PERCOCET (OXYCODONE-ACETAMINOPHEN)

## 2010-12-23 NOTE — Progress Notes (Signed)
Summary: Meds updated  Phone Note Call from Patient   Caller: Patient Call For: Cindee Salt MD Summary of Call: Patient saw Dr Renette Butters x 1 week ago, Dr suggested new dose of Ambien 10mg  and Dc'D neurontin. His cardio wanted you to give the  new rx for Ambien Initial call taken by: Mills Koller,  December 01, 2010 12:26 PM  Follow-up for Phone Call        okay Please update med list Follow-up by: Cindee Salt MD,  December 01, 2010 1:35 PM  Additional Follow-up for Phone Call Additional follow up Details #1::        Spoke with patient and advised results.  Additional Follow-up by: Mervin Hack CMA Duncan Dull),  December 01, 2010 3:27 PM    New/Updated Medications: AMBIEN 10 MG TABS (ZOLPIDEM TARTRATE) take 1 by mouth once daily at bedtime as needed to help sleep

## 2010-12-23 NOTE — Progress Notes (Signed)
Summary: Remus Loffler  Phone Note Refill Request Message from:  Fax from Pharmacy on December 15, 2010 1:54 PM  Refills Requested: Medication #1:  AMBIEN 10 MG TABS take 1 by mouth once daily at bedtime as needed to help sleep   Last Refilled: 09/07/2010 [facility] [address] [phone]. Fax is on your desk.    Initial call taken by: Melody Comas,  December 15, 2010 1:54 PM  Follow-up for Phone Call        okay #90 x 1 Follow-up by: Cindee Salt MD,  December 15, 2010 2:04 PM  Additional Follow-up for Phone Call Additional follow up Details #1::        Rx called to pharmacy Additional Follow-up by: DeShannon Katrinka Blazing CMA Duncan Dull),  December 15, 2010 2:48 PM    Prescriptions: AMBIEN 10 MG TABS (ZOLPIDEM TARTRATE) take 1 by mouth once daily at bedtime as needed to help sleep  #90 x 1   Entered by:   Mervin Hack CMA (AAMA)   Authorized by:   Cindee Salt MD   Signed by:   Mervin Hack CMA (AAMA) on 12/15/2010   Method used:   Telephoned to ...       [id number] ([facility]ail[facility]       [address]       [place], [place]  [address]       Ph: [phone] or [phone]       Fax: [phone]   RxID:   [id number]

## 2010-12-23 NOTE — Assessment & Plan Note (Signed)
Summary: HA,NOT FEELING WELL/CLE   Vital Signs:  Patient profile:   76 year old male Weight:      168 pounds Temp:     98.6 degrees F oral Pulse rate:   66 / minute Pulse rhythm:   regular BP sitting:   142 / 86  (left arm) Cuff size:   large  Vitals Entered By: Mervin Hack CMA Duncan Dull) (November 17, 2010 1:04 PM) CC: headache, not feeling well   History of Present Illness: Still just doesn't feel good still with neck and shoulder stiffness and pain Ongoing headaches--has to lie down and take hydrocodone---this isn't helping now has dizzy sensation as well no vertigo, hasn't fallen  Notices blurriness in vision---not sure if one eye or both Still feels the headaches may be related to his neck and shoulders Headache is bifrontal--"with dizzy feeling there"  No jaw claudication (but having dental work and new dentures made)  Is stiff all over relates to arthritis Left hip esp painful  No particular time of day for the headaches Gets up at 4AM to go to work 4 days per week Noise at work may exacerbate Has noticed at least once that a bright light has also brought on the pain  has lump under right arm which is now bigger after he squeezed out some pus  Allergies: 1)  * Bextra (Valdecoxib) 2)  Percocet (Oxycodone-Acetaminophen)  Past History:  Past medical, surgical, family and social histories (including risk factors) reviewed for relevance to current acute and chronic problems.  Past Medical History: Reviewed history from 05/04/2010 and no changes required. Allergic rhinitis Atrial fibrillation, chronic persistent. Failed DCCV x 3 in 2000 Diverticulosis, colon Hyperlipidemia Hypertension Erectile dysfunction Sleep disturbance Osteoarthritis Lumbar and cervical disk disease Impaired fasting glucose  Past Surgical History: Reviewed history from 04/25/2007 and no changes required. 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 HTN strong in family--both parents Mom died of stomach cancer Dad died of stroke 9 siblings--4 have died (1 in WW2, 2 other brothers when much older)  Social History: Reviewed history from 11/05/2007 and no changes required. 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   Head:  normocephalic and atraumatic.   No temporal bruits Eyes:  pupils equal, pupils round, pupils reactive to light, and no optic disk abnormalities.   Neck:  supple and no masses.   Mild reduced ROM Neurologic:  alert & oriented X3, cranial nerves II-XII intact, strength normal in all extremities, and gait normal.   Skin:  lump in right axilla--raised about 15mm no infection Psych:  normally interactive, good eye contact, not anxious appearing, and not depressed appearing.     Impression & Recommendations:  Problem # 1:  HEADACHE (ICD-784.0) Assessment New  some symptoms in past but this is really is worse and somewhat different nothing to suggest TA some features are suggestive of migraine I am concerned about his ambien and xanax causing some  withdrawl symptoms no worrisome neuro features  P: will set up with headache specialist to evaluate  His updated medication list for this problem includes:    Hydrocodone-acetaminophen 5-500 Mg Tabs (Hydrocodone-acetaminophen) .Marland Kitchen... Take 1 by mouth four times daily as needed    Tenormin 25 Mg Tabs (Atenolol) .Marland Kitchen... Take one by mouth  daily  Orders: Headache Clinic Referral (Headache)  Complete Medication List: 1)  Alprazolam 1 Mg Tabs (Alprazolam) .Marland Kitchen.. 1 tablet by mouth twice a day as needed 2)  Hydrochlorothiazide 25 Mg Tabs (Hydrochlorothiazide) .... Take 1 tablet by mouth every morning 3)  Coumadin 5 Mg Tabs (Warfarin sodium) .... As directed 4)  Viagra 100 Mg Tabs (Sildenafil citrate) .... Take one by mouth as needed 5)  Hydrocodone-acetaminophen 5-500 Mg Tabs (Hydrocodone-acetaminophen) .... Take 1 by mouth four times daily as needed 6)  Tenormin 25 Mg Tabs (Atenolol) .... Take one by mouth daily 7)  Zolpidem Tartrate 5 Mg Tabs (Zolpidem tartrate) .Marland Kitchen.. 1 at bedtime as needed to help sleep 8)  Zantac 150 Mg Tabs (Ranitidine hcl) .... Takes one tablet daily 9)  Potassium Tabs (Potassium tabs) .... Take one by mouth daily 10)  Multivitamins Tabs (Multiple vitamin) .Marland Kitchen.. 1 by mouth daily 11)  Vitamin C 500 Mg Tabs (Ascorbic acid) .Marland Kitchen.. 1 by mouth daily 12)  Neurontin 300 Mg Caps (Gabapentin) .... One tablet three times a day  Patient Instructions: 1)  Please keep your regular follow up 2)  Referral Appointment Information 3)  Day/Date: 4)  Time: 5)  Place/MD: 6)  Address: 7)  Phone/Fax: 8)  Patient given appointment information. Information/Orders faxed/mailed.   Orders Added: 1)  Est. Patient Level IV [[address]] 2)  Headache Clinic Referral [Headache]    Current Allergies (reviewed today): * BEXTRA (VALDECOXIB) PERCOCET (OXYCODONE-ACETAMINOPHEN)

## 2010-12-23 NOTE — Assessment & Plan Note (Signed)
Summary: ROV/AMD      Allergies Added:   Visit Type:  Follow-up Referring Provider:  Roxy Manns, MD Primary Provider:  Roxy Manns, MD  CC:  Having difficulty sleeping, shortness of breath with little activity, dizzy, terrible indigestion, and blurred vision and headache.  He states may be some depression.Marland Kitchen  History of Present Illness: 76 year-old gentleman with chronic  atrial fibrillation and HTN, remote hx of  DC cardioversion that was unsuccessful, on rate-control and anticoagulation for the past several years.  Mr. Dutter has multiple complaints today. He continues to have a slipped disc in his neck that he believes is causing headaches. His headaches are better with Vicodin.  He does report having rare episodes of dizziness. He does not think it is because of low blood pressure. He has been working with toxic chemicals, embalming cats  His sleep is poor. He does take Ambien though sometimes this does not work. He is less active than he used to be.  He has not been going to the gym recently.  he has been having some indigestion symptoms. It started one week prior to Christmas when he developed a viral URI. He continues to have periodic diarrhea. He does not think he has continued infection in his sinuses or chest Park from allergies.  He also has a history of bilateral shoulder surgery, replacement. He has a history of arthroscopic knee surgery on the right and feels that his right knee is having problems again.  He denies chest pain, shortness of breath, edema, or palpitations. no lightheadedness or near syncope.  EKG shows atrial fibrillation with rate of 64 beats per minute, consider old anteroseptal infarct     Current Medications (verified): 1)  Alprazolam 1 Mg Tabs (Alprazolam) .Marland Kitchen.. 1 Tablet By Mouth Twice A Day As Needed 2)  Hydrochlorothiazide 25 Mg Tabs (Hydrochlorothiazide) .... Take 1 Tablet By Mouth Every Morning 3)  Coumadin 5 Mg Tabs (Warfarin Sodium) .... As  Directed 4)  Viagra 100 Mg Tabs (Sildenafil Citrate) .... Take One By Mouth As Needed 5)  Hydrocodone-Acetaminophen 5-500 Mg Tabs (Hydrocodone-Acetaminophen) .... Take 1 By Mouth Four Times Daily As Needed 6)  Tenormin 25 Mg Tabs (Atenolol) .... Take One By Mouth Daily 7)  Zolpidem Tartrate 5 Mg Tabs (Zolpidem Tartrate) .Marland Kitchen.. 1 At Bedtime As Needed To Help Sleep 8)  Zantac 150 Mg Tabs (Ranitidine Hcl) .... Takes One Tablet Daily 9)  Potassium  Tabs (Potassium Tabs) .... Take One By Mouth Daily 10)  Multivitamins  Tabs (Multiple Vitamin) .Marland Kitchen.. 1 By Mouth Daily 11)  Vitamin C 500 Mg Tabs (Ascorbic Acid) .Marland Kitchen.. 1 By Mouth Daily 12)  Neurontin 300 Mg Caps (Gabapentin) .... One Tablet Three Times A Day  Allergies (verified): 1)  * Bextra (Valdecoxib) 2)  Percocet (Oxycodone-Acetaminophen)  Past History:  Past Medical History: Last updated: 05/04/2010 Allergic rhinitis Atrial fibrillation, chronic persistent. 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    Left shoulder hemiarthroplasty 2/06      Right shoulder surgery  Family History: Last updated: 05/05/2009 HTN strong in family--both parents Mom died of stomach cancer Dad died of stroke 9 siblings--4 have died (1 in WW2, 2 other brothers when much older)  Social History: Last updated: 11/05/2007  Marital Status: Separated 1997, Remarried 07/1999 Children: 2 daughters Occupation: Retired, Administrator, 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         indigestion, neck pain, insomnia  Vital Signs:  Patient profile:   76 year old male Height:      66 inches Weight:      167 pounds BMI:     27.05 Pulse rate:   64 / minute BP sitting:   112 / 62  (left arm) Cuff size:   regular  Vitals Entered By: Bishop Dublin, CMA (November 25, 2010 11:28 AM)  Physical Exam  General:  alert and normal appearance.   Head:  normocephalic and atraumatic Neck:  Neck supple, no JVD. No masses, thyromegaly or abnormal cervical nodes. Lungs:  Clear bilaterally to auscultation and percussion. Heart:  Non-displaced PMI, chest non-tender; irregular rate and rhythm, S1, S2 without murmurs, rubs or gallops. Carotid upstroke normal, no bruit. Pedals normal pulses. No edema, no varicosities. Abdomen:  Bowel sounds positive; abdomen soft and non-tender without masses Msk:  Back normal, normal gait. Muscle strength and tone normal. Pulses:  pulses normal in all 4 extremities Extremities:  No clubbing or cyanosis. Neurologic:  Alert and oriented x 3. Skin:  Intact without lesions or rashes. Psych:  Normal affect.   Impression & Recommendations:  Problem # 1:  ATRIAL FIBRILLATION (ICD-427.31) rate is well controlled. Significant symptoms from his atrial fibrillation.  His updated medication list for this problem includes:    Coumadin 5 Mg Tabs (Warfarin sodium) .Marland Kitchen... As directed    Tenormin 25 Mg Tabs (Atenolol) .Marland Kitchen... Take one by mouth daily  Problem # 2:  HYPERLIPIDEMIA (ICD-272.4) We have suggested that he have a cholesterol check in the next several weeks as part of a routine workup.  Problem # 3:  HYPERTENSION (ICD-401.9) blood pressure is well controlled on his current medication regimen. He did report an  episode of dizziness in the past and to monitor his blood pressure to make should that he is not hypotensive.  His updated medication list for this problem includes:    Hydrochlorothiazide 25 Mg Tabs (Hydrochlorothiazide) .Marland Kitchen... Take 1 tablet by mouth every morning    Tenormin 25 Mg Tabs (Atenolol) .Marland Kitchen... Take one by mouth daily  Problem # 4:  DIZZINESS (ICD-780.4) I suggested he monitored his blood pressure when he is dizzy to make sure that he is not hypotensive. His dizziness could be secondary to recent URI and inner ear involvement. 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

## 2010-12-23 NOTE — Progress Notes (Signed)
Summary: refill request for xanax, vicodin  Phone Note Refill Request Message from:  Fax from Pharmacy  Refills Requested: Medication #1:  ALPRAZOLAM 1 MG TABS 1 tablet by mouth twice a day as needed   Last Refilled: 10/25/2010  Medication #2:  HYDROCODONE-ACETAMINOPHEN 5-500 MG TABS Take 1 by mouth four times daily as needed   Last Refilled: 10/25/2010 Faxed request from [facility].  Initial call taken by: Lowella Petties CMA, AAMA,  November 25, 2010 8:29 AM  Follow-up for Phone Call        Rx called to pharmacy Follow-up by: Mervin Hack CMA Duncan Dull),  November 25, 2010 8:50 AM    Prescriptions: HYDROCODONE-ACETAMINOPHEN 5-500 MG TABS (HYDROCODONE-ACETAMINOPHEN) Take 1 by mouth four times daily as needed  #120 x 0   Entered and Authorized by:   Ruthe Mannan MD   Signed by:   Ruthe Mannan MD on 11/25/2010   Method used:   Telephoned to ...       [id number]       [address]       [place], [place]  [address]       Ph: [phone] or [phone]       Fax: [phone]   RxID:   [id number] ALPRAZOLAM 1 MG TABS (ALPRAZOLAM) 1 tablet by mouth twice a day as needed  #60 x 0   Entered and Authorized by:   Ruthe Mannan MD   Signed by:   Ruthe Mannan MD on 11/25/2010   Method used:   Telephoned to ...       [id number]       [address]       [place], [place]  [address]       Ph: [phone] or [phone]       Fax: [phone]   RxID:   [id number]

## 2010-12-23 NOTE — Progress Notes (Signed)
  Phone Note Call from Patient   Caller: Patient Summary of Call: Called patient to schedule the Headache Clinic appt. He said he does not want it that his headache is gone. Have cancelled the order for referral.  Initial call taken by: Carlton Adam,  November 19, 2010 4:50 PM  Follow-up for Phone Call        okay glad to hear the headache is gone Follow-up by: Cindee Salt MD,  November 22, 2010 9:18 AM

## 2010-12-29 ENCOUNTER — Other Ambulatory Visit: Payer: Self-pay

## 2010-12-29 NOTE — Progress Notes (Signed)
Summary: alprazolam / hydrocodone  Phone Note Refill Request Message from:  Fax from Pharmacy on December 22, 2010 1:43 PM  Refills Requested: Medication #1:  ALPRAZOLAM 1 MG TABS 1 tablet by mouth twice a day as needed   Last Refilled: 11/25/2010  Medication #2:  HYDROCODONE-ACETAMINOPHEN 5-500 MG TABS Take 1 by mouth four times daily as needed   Last Refilled: 11/25/2010 [facility] [address]   Initial call taken by: Melody Comas,  December 22, 2010 1:44 PM  Follow-up for Phone Call        Okay alprazolam #60 x 0 Hydrocodone #120 x 0 Follow-up by: Cindee Salt MD,  December 22, 2010 1:47 PM  Additional Follow-up for Phone Call Additional follow up Details #1::        Rx called to pharmacy Additional Follow-up by: DeShannon Smith CMA Duncan Dull),  December 22, 2010 2:20 PM    Prescriptions: HYDROCODONE-ACETAMINOPHEN 5-500 MG TABS (HYDROCODONE-ACETAMINOPHEN) Take 1 by mouth four times daily as needed  #120 x 0   Entered by:   Mervin Hack CMA (AAMA)   Authorized by:   Cindee Salt MD   Signed by:   Mervin Hack CMA (AAMA) on 12/22/2010   Method used:   Telephoned to ...       [id number]       [address]       [place], [place]  [address]       Ph: [phone] or [phone]       Fax: [phone]   RxID:   [id number] ALPRAZOLAM 1 MG TABS (ALPRAZOLAM) 1 tablet by mouth twice a day as needed  #60 x 0   Entered by:   Mervin Hack CMA (AAMA)   Authorized by:   Cindee Salt MD   Signed by:   Mervin Hack CMA (AAMA) on 12/22/2010   Method used:   Telephoned to ...       [id number]       [address]       [place], [place]  [address]       Ph: [phone] or [phone]       Fax: [phone]   RxID:   [id number]

## 2011-01-03 ENCOUNTER — Ambulatory Visit: Payer: Self-pay | Admitting: Unknown Physician Specialty

## 2011-01-19 ENCOUNTER — Ambulatory Visit: Payer: Self-pay | Admitting: Unknown Physician Specialty

## 2011-01-20 ENCOUNTER — Telehealth: Payer: Self-pay | Admitting: Internal Medicine

## 2011-01-21 ENCOUNTER — Telehealth: Payer: Self-pay | Admitting: Internal Medicine

## 2011-01-27 NOTE — Progress Notes (Signed)
Summary: refill request for vicodin  Phone Note Refill Request Message from:  Fax from Pharmacy  Refills Requested: Medication #1:  HYDROCODONE-ACETAMINOPHEN 5-500 MG TABS Take 1 by mouth four times daily as needed   Last Refilled: 12/22/2010 Faxed request from [facility] is on your desk.  Initial call taken by: Lowella Petties CMA, AAMA,  January 20, 2011 11:14 AM  Follow-up for Phone Call        Okay #120 x 0 Follow-up by: Cindee Salt MD,  January 20, 2011 12:47 PM  Additional Follow-up for Phone Call Additional follow up Details #1::        Rx faxed to pharmacy Additional Follow-up by: DeShannon Smith CMA Duncan Dull),  January 20, 2011 3:13 PM    Prescriptions: HYDROCODONE-ACETAMINOPHEN 5-500 MG TABS (HYDROCODONE-ACETAMINOPHEN) Take 1 by mouth four times daily as needed  #120 x 0   Entered by:   Mervin Hack CMA (AAMA)   Authorized by:   Cindee Salt MD   Signed by:   Mervin Hack CMA (AAMA) on 01/20/2011   Method used:   Handwritten   RxID:   [id number]

## 2011-01-27 NOTE — Progress Notes (Signed)
Summary: refill request for Alprazolam  Phone Note Refill Request Message from:  Fax from Pharmacy  Refills Requested: Medication #1:  HYDROCODONE-ACETAMINOPHEN 5-500 MG TABS Take 1 by mouth four times daily as needed   Last Refilled: 12/22/2010 Faxed request from [facility] is on your desk.  Initial call taken by: Lowella Petties CMA, AAMA,  January 21, 2011 12:29 PM  Follow-up for Phone Call        okay #60 x 1 Follow-up by: Cindee Salt MD,  January 21, 2011 1:34 PM  Additional Follow-up for Phone Call Additional follow up Details #1::        I am assuming this is for Alprazolam 1mg , because that's the fax that I have the refill for. Pt got refill for hydrocodone yesterday 01/20/11, I will refill Alprazolam 1mg . Additional Follow-up by: Mervin Hack CMA Duncan Dull),  January 21, 2011 2:56 PM    Prescriptions: ALPRAZOLAM 1 MG TABS (ALPRAZOLAM) 1 tablet by mouth twice a day as needed  #60 x 1   Entered by:   Mervin Hack CMA (AAMA)   Authorized by:   Cindee Salt MD   Signed by:   Mervin Hack CMA (AAMA) on 01/21/2011   Method used:   Handwritten   RxID:   [id number]

## 2011-02-02 ENCOUNTER — Encounter: Payer: Self-pay | Admitting: Internal Medicine

## 2011-02-02 ENCOUNTER — Ambulatory Visit (INDEPENDENT_AMBULATORY_CARE_PROVIDER_SITE_OTHER): Payer: MEDICARE

## 2011-02-02 DIAGNOSIS — Z7901 Long term (current) use of anticoagulants: Secondary | ICD-10-CM

## 2011-02-02 DIAGNOSIS — Z5181 Encounter for therapeutic drug level monitoring: Secondary | ICD-10-CM

## 2011-02-02 DIAGNOSIS — I4891 Unspecified atrial fibrillation: Secondary | ICD-10-CM

## 2011-02-02 LAB — CONVERTED CEMR LAB
INR: 3.1
Prothrombin Time: 37.3 s

## 2011-02-08 NOTE — Medication Information (Signed)
Summary: protime/tw  Anticoagulant Therapy  PCP: Alphonsus Sias Indication 1: Atrial fibrillation PT 37.3 INR RANGE 2.0-3.0           Allergies: 1)  * Bextra (Valdecoxib) 2)  Percocet (Oxycodone-Acetaminophen)  Anticoagulation Management History:      Positive risk factors for bleeding include an age of 81 years or older.  The bleeding index is 'intermediate risk'.  Positive CHADS2 values include History of HTN.  Negative CHADS2 values include Age > 8 years old.  His last INR was 3.2 and today's INR is 3.1.  Prothrombin time is 37.3.    Anticoagulation Management Assessment/Plan:      The patient's current anticoagulation dose is Coumadin 5 mg tabs: As directed.  The next INR is due 4 weeks.         ANTICOAGULATION RECORD PREVIOUS REGIMEN & LAB RESULTS Anticoagulation Diagnosis:  Atrial fibrillation on  12/14/2009 Previous INR Goal Range:  2.0-3.0 on  12/14/2009 Previous INR:  3.2 on  12/23/2010 Previous Coumadin Dose(mg):  5mg  daily on  08/18/2010 Previous Regimen:  5mg  qd, 7.5mg  W,sat on  12/01/2010 Previous Coagulation Comments:  . on  03/27/2009  NEW REGIMEN & LAB RESULTS Current INR: 3.1 Current Coumadin Dose(mg): 5mg  qd, 7.5mg  W,sat  Regimen: 5mg  qd, 7.5mg  W,sat   Provider: letvak Repeat testing in: 4 weeks Other Comments: pt held 6 doses for a hip injection, has been back on coumadin x 1 week  Dose has been reviewed with patient or caretaker during this visit. Reviewed by: Celso Sickle  Anticoagulation Visit Questionnaire Coumadin dose missed/changed:  Yes Coumadin Dose Comments:  one or more missed dose(s) Abnormal Bleeding Symptoms:  No  Any diet changes including alcohol intake, vegetables or greens since the last visit:  No Any illnesses or hospitalizations since the last visit:  No Any signs of clotting since the last visit (including chest discomfort, dizziness, shortness of breath, arm tingling, slurred speech, swelling or redness in leg):   No  MEDICATIONS AMBIEN 10 MG TABS (ZOLPIDEM TARTRATE) take 1 by mouth once daily at bedtime as needed to help sleep ALPRAZOLAM 1 MG TABS (ALPRAZOLAM) 1 tablet by mouth twice a day as needed HYDROCHLOROTHIAZIDE 25 MG TABS (HYDROCHLOROTHIAZIDE) Take 1 tablet by mouth every morning COUMADIN 5 MG TABS (WARFARIN SODIUM) As directed VIAGRA 100 MG TABS (SILDENAFIL CITRATE) Take one by mouth as needed HYDROCODONE-ACETAMINOPHEN 5-500 MG TABS (HYDROCODONE-ACETAMINOPHEN) Take 1 by mouth four times daily as needed TENORMIN 25 MG TABS (ATENOLOL) take one by mouth daily ZANTAC 150 MG TABS (RANITIDINE HCL) takes one tablet daily POTASSIUM  TABS (POTASSIUM TABS) Take one by mouth daily MULTIVITAMINS  TABS (MULTIPLE VITAMIN) 1 by mouth daily VITAMIN C 500 MG TABS (ASCORBIC ACID) 1 by mouth daily    Laboratory Results   Blood Tests   Date/Time Received: February 02, 2011 12:11 PM Date/Time Reported: February 02, 2011 12:11 PM  PT: 37.3 s   (Normal Range: 10.6-13.4)  INR: 3.1   (Normal Range: 0.88-1.12   Therap INR: 2.0-3.5)

## 2011-02-11 ENCOUNTER — Telehealth: Payer: Self-pay | Admitting: Radiology

## 2011-02-11 DIAGNOSIS — I4891 Unspecified atrial fibrillation: Secondary | ICD-10-CM

## 2011-02-11 DIAGNOSIS — Z7901 Long term (current) use of anticoagulants: Secondary | ICD-10-CM

## 2011-02-11 DIAGNOSIS — Z5181 Encounter for therapeutic drug level monitoring: Secondary | ICD-10-CM

## 2011-02-11 NOTE — Telephone Encounter (Signed)
Order for INR

## 2011-02-12 ENCOUNTER — Other Ambulatory Visit: Payer: Self-pay | Admitting: Family Medicine

## 2011-02-17 ENCOUNTER — Encounter: Payer: Self-pay | Admitting: Internal Medicine

## 2011-02-18 ENCOUNTER — Encounter: Payer: Self-pay | Admitting: Internal Medicine

## 2011-02-18 ENCOUNTER — Ambulatory Visit (INDEPENDENT_AMBULATORY_CARE_PROVIDER_SITE_OTHER): Payer: MEDICARE | Admitting: Internal Medicine

## 2011-02-18 VITALS — BP 120/80 | HR 64 | Temp 98.5°F | Ht 66.5 in | Wt 168.0 lb

## 2011-02-18 DIAGNOSIS — M159 Polyosteoarthritis, unspecified: Secondary | ICD-10-CM

## 2011-02-18 DIAGNOSIS — I10 Essential (primary) hypertension: Secondary | ICD-10-CM

## 2011-02-18 DIAGNOSIS — I4891 Unspecified atrial fibrillation: Secondary | ICD-10-CM

## 2011-02-18 NOTE — Patient Instructions (Signed)
Please keep your regular appointment

## 2011-02-18 NOTE — Progress Notes (Signed)
  Subjective:    Patient ID: Mark Padilla, male    DOB: 1935/04/29, 76 y.o.   MRN: [id number]  HPI Has had increasing right knee pain "I can barely walk on it" Has pain in left hip also--but the knee is the worst Had fluoro guided steroid injection in left hip recently--only brief help Here for clearance for surgery---TKR  Recent evaluation by cardiologist for dizziness No indication of cardiac ischemia No chest pain NO SOB except when pollen high (more of a congestion issue and sneezing) No wheezing  No edema Sleeps flat--No orthopnea  Past Medical History  Diagnosis Date  . Allergy   . Hyperlipidemia   . Hypertension   . Diverticulitis   . Atrial fibrillation     chronic persistent, failed DCCV x3 in 2000  . ED (erectile dysfunction)   . Sleep disturbance   . Osteoarthritis   . Lumbar disc disease   . Cervical disc disease   . Impaired fasting glucose     Past Surgical History  Procedure Date  . Shoulder surgery 1950's    dislocation  . Knee arthroscopy 06/1998    right Gavin Potters)  . Hemiarthroplasty shoulder fracture 10/2004    left  . Shoulder surgery 12/2004    right    Family History  Problem Relation Age of Onset  . Cancer Mother     stomach cancer  . Stroke Father     History   Social History  . Marital Status: Married    Spouse Name: N/A    Number of Children: 2  . Years of Education: N/A   Occupational History  . retired, [employer] works part time [employer]    Social History Main Topics  . Smoking status: Former Smoker    Types: Cigarettes    Quit date: 11/21/1962  . Smokeless tobacco: Not on file  . Alcohol Use: Yes     occasional  . Drug Use: No  . Sexually Active: Not on file   Other Topics Concern  . Not on file   Social History Narrative  . No narrative on file    Review of Systems Sleeps about 5.5 hours with Remus Loffler now Still with some nocturia--only once Appetite is fine---unless joint pain is bad      Objective:   Physical Exam  Constitutional: He appears well-developed and well-nourished. No distress.  Neck: Normal range of motion. Neck supple. No thyromegaly present.  Cardiovascular: Normal rate, normal heart sounds and intact distal pulses.  Exam reveals no gallop.   No murmur heard.      Irregularly irregular rhythm  Pulmonary/Chest: Effort normal and breath sounds normal. No respiratory distress. He has no wheezes. He has no rales.  Abdominal: Soft. He exhibits no mass. There is no tenderness.  Musculoskeletal: He exhibits no edema.       No calf tenderness  Lymphadenopathy:    He has no cervical adenopathy.  Skin: No rash noted.       No ulcers  Psychiatric: He has a normal mood and affect. His behavior is normal. Judgment and thought content normal.          Assessment & Plan:

## 2011-02-20 HISTORY — PX: JOINT REPLACEMENT: SHX530

## 2011-02-20 HISTORY — PX: OTHER SURGICAL HISTORY: SHX169

## 2011-02-23 ENCOUNTER — Other Ambulatory Visit: Payer: Self-pay | Admitting: *Deleted

## 2011-02-23 MED ORDER — HYDROCHLOROTHIAZIDE 25 MG PO TABS: 25.0000 mg | ORAL_TABLET | Freq: Every day | ORAL | Status: DC

## 2011-02-28 ENCOUNTER — Other Ambulatory Visit: Payer: Self-pay | Admitting: *Deleted

## 2011-02-28 ENCOUNTER — Ambulatory Visit: Payer: Self-pay | Admitting: Unknown Physician Specialty

## 2011-02-28 MED ORDER — HYDROCODONE-ACETAMINOPHEN 5-500 MG PO TABS: 1.0000 | ORAL_TABLET | Freq: Four times a day (QID) | ORAL | Status: DC | PRN

## 2011-02-28 NOTE — Telephone Encounter (Signed)
rx faxed over to pharmacy, manually. 

## 2011-02-28 NOTE — Telephone Encounter (Signed)
Form on your desk  

## 2011-02-28 NOTE — Telephone Encounter (Signed)
Okay #120 x 0 

## 2011-03-14 ENCOUNTER — Other Ambulatory Visit: Payer: Self-pay | Admitting: *Deleted

## 2011-03-14 MED ORDER — HYDROCHLOROTHIAZIDE 25 MG PO TABS: 25.0000 mg | ORAL_TABLET | Freq: Every day | ORAL | Status: DC

## 2011-03-16 ENCOUNTER — Ambulatory Visit: Payer: MEDICARE

## 2011-03-16 ENCOUNTER — Inpatient Hospital Stay: Payer: Self-pay | Admitting: Unknown Physician Specialty

## 2011-03-17 LAB — PATHOLOGY REPORT

## 2011-03-21 ENCOUNTER — Other Ambulatory Visit: Payer: Self-pay | Admitting: Unknown Physician Specialty

## 2011-03-21 LAB — PROTIME-INR

## 2011-03-22 ENCOUNTER — Encounter: Payer: Self-pay | Admitting: Internal Medicine

## 2011-03-22 ENCOUNTER — Telehealth: Payer: Self-pay | Admitting: *Deleted

## 2011-03-22 NOTE — Telephone Encounter (Signed)
Nurse with [facility] called to report pt's INR of 1.1 and protime of 14.2.  He is also on lovenox 30 mg's twice a day.  Current coumadin dose is 5 mg's daily except for 7.5 on wed, Saturday.  He was recently discharged from hospital after hip replacement surgery with instructions that you are to follow his protimes.  Please advise.

## 2011-03-22 NOTE — Telephone Encounter (Signed)
Needs to continue lovenox No change in coumadin for now Recheck protime on Thursday 5/3

## 2011-03-22 NOTE — Telephone Encounter (Signed)
Advised home health nurse

## 2011-03-22 NOTE — Telephone Encounter (Signed)
Report is on your desk

## 2011-03-23 ENCOUNTER — Encounter: Payer: Self-pay | Admitting: Internal Medicine

## 2011-03-24 ENCOUNTER — Other Ambulatory Visit: Payer: Self-pay | Admitting: Unknown Physician Specialty

## 2011-03-25 ENCOUNTER — Telehealth: Payer: Self-pay | Admitting: *Deleted

## 2011-03-25 MED ORDER — ENOXAPARIN SODIUM 40 MG/0.4ML ~~LOC~~ SOLN: SUBCUTANEOUS | Status: DC

## 2011-03-25 NOTE — Telephone Encounter (Signed)
Received a fax from [facility] of patients PT/INR, PT 17.6 INR 1.5.  Called patient and he stated that Dr. Erin Sons had the nurse come out to his house after his hip surgery to have these labs checked, but Dr. Gavin Potters does not manage his PT/INR.  Patient stated that he was on 5 mg daily except for Wednesday and Saturday when he takes 7.5 mg.  Went off of this dose on 03/11/2011 to prepare for surgery and went back on this dose on 03/19/2011.  Per Dr. Alphonsus Sias patient will increase Coumadin dose to 7.5 mg daily for now, restart Lovenox injections, and recheck PT/INR here on Monday.  Patient advised via telephone and I will [facility] in [facility].

## 2011-03-25 NOTE — Telephone Encounter (Signed)
[facility] then faxed the same lab from nurse Murlean Caller [phone] Will notify her of the plans

## 2011-03-25 NOTE — Telephone Encounter (Signed)
Advised Zella Ball as instructed via message left on her cell phone voicemail, [phone].

## 2011-03-28 ENCOUNTER — Ambulatory Visit (INDEPENDENT_AMBULATORY_CARE_PROVIDER_SITE_OTHER): Payer: MEDICARE | Admitting: Internal Medicine

## 2011-03-28 ENCOUNTER — Ambulatory Visit: Payer: MEDICARE

## 2011-03-28 DIAGNOSIS — I4891 Unspecified atrial fibrillation: Secondary | ICD-10-CM

## 2011-03-28 DIAGNOSIS — Z5181 Encounter for therapeutic drug level monitoring: Secondary | ICD-10-CM

## 2011-03-28 DIAGNOSIS — Z7901 Long term (current) use of anticoagulants: Secondary | ICD-10-CM

## 2011-03-28 LAB — POCT INR: INR: 2.1

## 2011-03-28 NOTE — Patient Instructions (Signed)
Told that he can stop lovenox now

## 2011-04-04 ENCOUNTER — Ambulatory Visit (INDEPENDENT_AMBULATORY_CARE_PROVIDER_SITE_OTHER): Payer: Medicare Other | Admitting: Internal Medicine

## 2011-04-04 ENCOUNTER — Other Ambulatory Visit: Payer: Self-pay | Admitting: *Deleted

## 2011-04-04 DIAGNOSIS — Z7901 Long term (current) use of anticoagulants: Secondary | ICD-10-CM

## 2011-04-04 DIAGNOSIS — Z5181 Encounter for therapeutic drug level monitoring: Secondary | ICD-10-CM

## 2011-04-04 DIAGNOSIS — I4891 Unspecified atrial fibrillation: Secondary | ICD-10-CM

## 2011-04-04 LAB — POCT INR: INR: 2.5

## 2011-04-04 MED ORDER — ALPRAZOLAM 1 MG PO TABS: 1.0000 mg | ORAL_TABLET | Freq: Two times a day (BID) | ORAL | Status: DC | PRN

## 2011-04-04 MED ORDER — HYDROCODONE-ACETAMINOPHEN 5-500 MG PO TABS: 1.0000 | ORAL_TABLET | Freq: Four times a day (QID) | ORAL | Status: DC | PRN

## 2011-04-04 NOTE — Telephone Encounter (Signed)
Okay hydrocodone #120 x 0 Alprazolam #60 x 0

## 2011-04-04 NOTE — Telephone Encounter (Signed)
rx called into pharmacy

## 2011-04-04 NOTE — Telephone Encounter (Signed)
Duplicate

## 2011-04-04 NOTE — Telephone Encounter (Signed)
Can this be refilled? 

## 2011-04-04 NOTE — Patient Instructions (Signed)
Continue current dose and return in 6 weeks.

## 2011-04-04 NOTE — Telephone Encounter (Signed)
Can these be refilled? 

## 2011-04-19 ENCOUNTER — Other Ambulatory Visit: Payer: Self-pay | Admitting: *Deleted

## 2011-04-19 MED ORDER — HYDROCHLOROTHIAZIDE 25 MG PO TABS: 25.0000 mg | ORAL_TABLET | Freq: Every day | ORAL | Status: DC

## 2011-04-20 ENCOUNTER — Other Ambulatory Visit: Payer: Self-pay | Admitting: *Deleted

## 2011-04-20 MED ORDER — ALPRAZOLAM 1 MG PO TABS: 1.0000 mg | ORAL_TABLET | Freq: Two times a day (BID) | ORAL | Status: DC | PRN

## 2011-04-20 NOTE — Telephone Encounter (Signed)
Fax is on your desk . 

## 2011-04-20 NOTE — Telephone Encounter (Signed)
rx faxed to pharmacy manually  

## 2011-04-20 NOTE — Telephone Encounter (Signed)
Okay #60 x 1 

## 2011-04-28 ENCOUNTER — Encounter: Payer: Self-pay | Admitting: Internal Medicine

## 2011-04-28 ENCOUNTER — Ambulatory Visit (INDEPENDENT_AMBULATORY_CARE_PROVIDER_SITE_OTHER): Payer: Medicare Other | Admitting: Internal Medicine

## 2011-04-28 VITALS — BP 140/80 | HR 70 | Temp 98.4°F | Ht 66.0 in | Wt 166.0 lb

## 2011-04-28 DIAGNOSIS — Z23 Encounter for immunization: Secondary | ICD-10-CM

## 2011-04-28 DIAGNOSIS — E785 Hyperlipidemia, unspecified: Secondary | ICD-10-CM

## 2011-04-28 DIAGNOSIS — Z0000 Encounter for general adult medical examination without abnormal findings: Secondary | ICD-10-CM | POA: Insufficient documentation

## 2011-04-28 DIAGNOSIS — I4891 Unspecified atrial fibrillation: Secondary | ICD-10-CM

## 2011-04-28 DIAGNOSIS — I10 Essential (primary) hypertension: Secondary | ICD-10-CM

## 2011-04-28 DIAGNOSIS — R0989 Other specified symptoms and signs involving the circulatory and respiratory systems: Secondary | ICD-10-CM

## 2011-04-28 DIAGNOSIS — M159 Polyosteoarthritis, unspecified: Secondary | ICD-10-CM

## 2011-04-28 DIAGNOSIS — Z125 Encounter for screening for malignant neoplasm of prostate: Secondary | ICD-10-CM

## 2011-04-28 MED ORDER — FLUTICASONE PROPIONATE 50 MCG/ACT NA SUSP: 2.0000 | Freq: Every day | NASAL | Status: DC

## 2011-04-28 NOTE — Assessment & Plan Note (Signed)
Good rate control On coumadin 

## 2011-04-28 NOTE — Progress Notes (Signed)
Subjective:    Patient ID: Mark Padilla, male    DOB: 04-25-1935, 76 y.o.   MRN: [id number]  HPI Had left THR in April Had been due for knee replacement but decided to do the hip first since it got worse Generally has done okay Still needs pain meds regularly  Discussed PSAs He still wants to get it done Lab Results  Component Value Date   PSA 3.50 11/02/2009   PSA 3.92 10/29/2008   PSA 4.02* 04/28/2008   Has noticed some raspiness to voice ??due to sinus drainage Did have cold ~2 weeks ago Still with drainage despite loratadine bid No sig heartburn--continues on omeprazole  Current Outpatient Prescriptions on File Prior to Visit  Medication Sig Dispense Refill  . ALPRAZolam (XANAX) 1 MG tablet Take 1 tablet (1 mg total) by mouth 2 (two) times daily as needed.  60 tablet  1  . Ascorbic Acid (VITAMIN C) 500 MG tablet Take 500 mg by mouth daily.        Marland Kitchen atenolol (TENORMIN) 25 MG tablet Take 25 mg by mouth daily.        . celecoxib (CELEBREX) 200 MG capsule Take 200 mg by mouth daily.        Marland Kitchen gabapentin (NEURONTIN) 300 MG capsule Take 300 mg by mouth 3 (three) times daily.        . hydrochlorothiazide 25 MG tablet Take 1 tablet (25 mg total) by mouth daily.  90 tablet  3  . HYDROcodone-acetaminophen (VICODIN) 5-500 MG per tablet Take 1 tablet by mouth 4 (four) times daily as needed.  120 tablet  0  . Multiple Vitamin (MULTIVITAMIN) capsule Take 1 capsule by mouth daily.        . Potassium 75 MG TABS Take by mouth daily.        . sildenafil (VIAGRA) 100 MG tablet Take 100 mg by mouth daily as needed.        . warfarin (COUMADIN) 5 MG tablet Take 5 mg by mouth daily. As directed        . zolpidem (AMBIEN) 10 MG tablet Take 10 mg by mouth at bedtime as needed.        Marland Kitchen DISCONTD: enoxaparin (LOVENOX) 40 MG/0.4ML SOLN One injection twice daily  11.2 mL  1  . DISCONTD: hydrochlorothiazide 25 MG tablet TAKE 1 TABLET BY MOUTH EVERY MORNING  90 tablet  3  . DISCONTD: ranitidine  (ZANTAC) 150 MG capsule Take 150 mg by mouth every evening.         Past Medical History  Diagnosis Date  . Allergy   . Hyperlipidemia   . Hypertension   . Diverticulitis   . Atrial fibrillation     chronic persistent, failed DCCV x3 in 2000  . ED (erectile dysfunction)   . Sleep disturbance   . Osteoarthritis   . Lumbar disc disease   . Cervical disc disease   . Impaired fasting glucose     Past Surgical History  Procedure Date  . Shoulder surgery 1950's    dislocation  . Knee arthroscopy 06/1998    right Gavin Potters)  . Hemiarthroplasty shoulder fracture 10/2004    left  . Shoulder surgery 12/2004    right  . Thr--left 4/12    Dr Gavin Potters  . Joint replacement 4/12    Left hip--Dr Gavin Potters    Family History  Problem Relation Age of Onset  . Cancer Mother     stomach cancer  . Stroke Father  History   Social History  . Marital Status: Married    Spouse Name: N/A    Number of Children: 2  . Years of Education: N/A   Occupational History  . retired, [employer] works part time [employer]    Social History Main Topics  . Smoking status: Former Smoker    Types: Cigarettes    Quit date: 11/21/1962  . Smokeless tobacco: Not on file  . Alcohol Use: Yes     occasional  . Drug Use: No  . Sexually Active: Not on file   Other Topics Concern  . Not on file   Social History Narrative  . No narrative on file   Review of Systems  Constitutional: Negative for fatigue and unexpected weight change.       [facility] Wears seat belt  HENT: Positive for congestion, rhinorrhea and tinnitus. Negative for hearing loss and dental problem.        Dentures  Eyes: Negative for redness and visual disturbance.       No diplopia or focal vision loss  Respiratory: Negative for cough, chest tightness and shortness of breath.   Cardiovascular: Negative for chest pain, palpitations and leg swelling.  Gastrointestinal: Negative for  nausea, vomiting, constipation and blood in stool.       Rare heartburn on med  Genitourinary: Positive for difficulty urinating. Negative for dysuria, urgency and decreased urine volume.       Some urinary hesitancy  Musculoskeletal: Positive for back pain and arthralgias. Negative for joint swelling.  Skin: Negative for color change and rash.       No suspicious lesions  Neurological: Positive for headaches. Negative for dizziness, syncope, weakness and light-headedness.       Occ headache from neck arthritis  Hematological: Negative for adenopathy. Does not bruise/bleed easily.  Psychiatric/Behavioral: Positive for sleep disturbance and dysphoric mood. The patient is not nervous/anxious.        Sporadic sleep problems Depressed due to recurrent pain in hip post op. Seems better now Gets lonely at times       Objective:   Physical Exam  Constitutional: He is oriented to person, place, and time. He appears well-developed and well-nourished. No distress.  HENT:  Head: Normocephalic and atraumatic.  Right Ear: External ear normal.  Left Ear: External ear normal.  Mouth/Throat: Oropharynx is clear and moist. No oropharyngeal exudate.       TMs normal  Eyes: Conjunctivae and EOM are normal. Pupils are equal, round, and reactive to light.       Fundi benign  Neck: Normal range of motion. Neck supple. No thyromegaly present.  Cardiovascular: Normal rate, normal heart sounds and intact distal pulses.  Exam reveals no gallop.   No murmur heard.      irregular  Pulmonary/Chest: Effort normal and breath sounds normal. No respiratory distress. He has no wheezes. He has no rales.  Abdominal: Soft. He exhibits mass. There is no tenderness.       Palpable aorta  Musculoskeletal: Normal range of motion. He exhibits no edema and no tenderness.  Lymphadenopathy:    He has no cervical adenopathy.  Neurological: He is alert and oriented to person, place, and time. He exhibits normal muscle tone.         Walks well with cane  Skin: Skin is warm. No rash noted.  Psychiatric: He has a normal mood and affect. His behavior is normal. Judgment and thought  content normal.          Assessment & Plan:

## 2011-04-28 NOTE — Assessment & Plan Note (Signed)
BP Readings from Last 3 Encounters:  04/28/11 140/80  02/18/11 120/80  11/25/10 112/62   Good control No changes needed

## 2011-04-28 NOTE — Patient Instructions (Addendum)
If the hoarseness continues, please call for referral to ENT for further evaluation Please try the fluticasone nasal spray Please set up the ultrasound of your aorta

## 2011-04-28 NOTE — Assessment & Plan Note (Signed)
Ongoing pain Will probably get right knee replacement next

## 2011-04-28 NOTE — Assessment & Plan Note (Signed)
[facility] Will update imms---pneumovax, Tdap and zostavax Requests continuing PSA checks Colon not due yet

## 2011-04-28 NOTE — Assessment & Plan Note (Signed)
Will check ultrasound to be sure no aneurysm

## 2011-04-28 NOTE — Assessment & Plan Note (Signed)
Lab Results  Component Value Date   LDLCALC 128* 11/02/2009   Okay without meds

## 2011-04-29 LAB — CBC WITH DIFFERENTIAL/PLATELET
Basophils Absolute: 0 10*3/uL (ref 0.0–0.1)
Basophils Relative: 0.6 % (ref 0.0–3.0)
Eosinophils Absolute: 0.4 10*3/uL (ref 0.0–0.7)
Eosinophils Relative: 6 % — ABNORMAL HIGH (ref 0.0–5.0)
HCT: 39.2 % (ref 39.0–52.0)
Hemoglobin: 13.4 g/dL (ref 13.0–17.0)
Lymphocytes Relative: 22.7 % (ref 12.0–46.0)
Lymphs Abs: 1.5 10*3/uL (ref 0.7–4.0)
MCHC: 34.3 g/dL (ref 30.0–36.0)
MCV: 93.8 fl (ref 78.0–100.0)
Monocytes Absolute: 0.5 10*3/uL (ref 0.1–1.0)
Monocytes Relative: 7.5 % (ref 3.0–12.0)
Neutro Abs: 4.1 10*3/uL (ref 1.4–7.7)
Neutrophils Relative %: 63.2 % (ref 43.0–77.0)
Platelets: 239 10*3/uL (ref 150.0–400.0)
RBC: 4.18 Mil/uL — ABNORMAL LOW (ref 4.22–5.81)
RDW: 13.7 % (ref 11.5–14.6)
WBC: 6.4 10*3/uL (ref 4.5–10.5)

## 2011-04-29 LAB — HEPATIC FUNCTION PANEL
ALT: 22 U/L (ref 0–53)
AST: 26 U/L (ref 0–37)
Albumin: 4.7 g/dL (ref 3.5–5.2)
Alkaline Phosphatase: 77 U/L (ref 39–117)
Bilirubin, Direct: 0.1 mg/dL (ref 0.0–0.3)
Total Bilirubin: 0.5 mg/dL (ref 0.3–1.2)
Total Protein: 7.3 g/dL (ref 6.0–8.3)

## 2011-04-29 LAB — BASIC METABOLIC PANEL
BUN: 13 mg/dL (ref 6–23)
CO2: 32 mEq/L (ref 19–32)
Calcium: 9.8 mg/dL (ref 8.4–10.5)
Chloride: 102 mEq/L (ref 96–112)
Creatinine, Ser: 0.7 mg/dL (ref 0.4–1.5)
GFR: 117.04 mL/min (ref >60.00)
Glucose, Bld: 99 mg/dL (ref 70–99)
Potassium: 4 mEq/L (ref 3.5–5.1)
Sodium: 139 mEq/L (ref 135–145)

## 2011-04-29 LAB — LIPID PANEL
Cholesterol: 211 mg/dL — ABNORMAL HIGH (ref 0–200)
HDL: 54.3 mg/dL (ref >39.00)
Total CHOL/HDL Ratio: 4
Triglycerides: 81 mg/dL (ref 0.0–149.0)
VLDL: 16.2 mg/dL (ref 0.0–40.0)

## 2011-04-29 LAB — LDL CHOLESTEROL, DIRECT: Direct LDL: 131.5 mg/dL

## 2011-04-29 LAB — PSA: PSA: 5.32 ng/mL — ABNORMAL HIGH (ref 0.10–4.00)

## 2011-04-29 LAB — TSH: TSH: 1.11 u[IU]/mL (ref 0.35–5.50)

## 2011-05-02 ENCOUNTER — Encounter: Payer: Self-pay | Admitting: *Deleted

## 2011-05-09 ENCOUNTER — Other Ambulatory Visit: Payer: Self-pay | Admitting: *Deleted

## 2011-05-09 DIAGNOSIS — R490 Dysphonia: Secondary | ICD-10-CM

## 2011-05-09 MED ORDER — HYDROCODONE-ACETAMINOPHEN 5-500 MG PO TABS
1.0000 | ORAL_TABLET | Freq: Four times a day (QID) | ORAL | Status: DC | PRN
Start: 2011-05-09 — End: 2011-06-06

## 2011-05-09 NOTE — Telephone Encounter (Signed)
Rx signed in note

## 2011-05-09 NOTE — Telephone Encounter (Signed)
Fax is on your desk . 

## 2011-05-09 NOTE — Telephone Encounter (Signed)
Okay #120 x 0  Will proceed with ENT eval

## 2011-05-09 NOTE — Telephone Encounter (Signed)
Pt called and asked if he could be referred to ENT in [place], he is still having raspy voice and wants to get it checked out.

## 2011-05-10 NOTE — Telephone Encounter (Signed)
Appt ENT Dr Andee Poles 05/12/2011 at 10:30. MK

## 2011-05-10 NOTE — Telephone Encounter (Signed)
rx faxed to pharmacy manually  

## 2011-05-16 ENCOUNTER — Ambulatory Visit (INDEPENDENT_AMBULATORY_CARE_PROVIDER_SITE_OTHER): Payer: Medicare Other | Admitting: Internal Medicine

## 2011-05-16 DIAGNOSIS — Z7901 Long term (current) use of anticoagulants: Secondary | ICD-10-CM

## 2011-05-16 DIAGNOSIS — Z5181 Encounter for therapeutic drug level monitoring: Secondary | ICD-10-CM

## 2011-05-16 DIAGNOSIS — I4891 Unspecified atrial fibrillation: Secondary | ICD-10-CM

## 2011-05-16 LAB — POCT INR: INR: 2.2

## 2011-05-16 NOTE — Patient Instructions (Signed)
5 mg daily, 7.5 mg WED,SAT.Recheck in 5 weeks

## 2011-05-24 ENCOUNTER — Encounter (INDEPENDENT_AMBULATORY_CARE_PROVIDER_SITE_OTHER): Payer: Medicare Other | Admitting: *Deleted

## 2011-05-24 DIAGNOSIS — I10 Essential (primary) hypertension: Secondary | ICD-10-CM

## 2011-05-24 DIAGNOSIS — R0989 Other specified symptoms and signs involving the circulatory and respiratory systems: Secondary | ICD-10-CM

## 2011-05-24 DIAGNOSIS — I700 Atherosclerosis of aorta: Secondary | ICD-10-CM

## 2011-05-26 ENCOUNTER — Other Ambulatory Visit: Payer: Self-pay | Admitting: Internal Medicine

## 2011-05-27 ENCOUNTER — Encounter: Payer: Self-pay | Admitting: Internal Medicine

## 2011-06-06 ENCOUNTER — Other Ambulatory Visit: Payer: Self-pay | Admitting: *Deleted

## 2011-06-06 MED ORDER — HYDROCODONE-ACETAMINOPHEN 5-500 MG PO TABS: 1.0000 | ORAL_TABLET | Freq: Four times a day (QID) | ORAL | Status: DC | PRN

## 2011-06-06 MED ORDER — ALPRAZOLAM 1 MG PO TABS: 1.0000 mg | ORAL_TABLET | Freq: Two times a day (BID) | ORAL | Status: DC | PRN

## 2011-06-06 NOTE — Telephone Encounter (Signed)
Okay #60 x 0 for alprazolam #120 x 0 for hydrocodone

## 2011-06-06 NOTE — Telephone Encounter (Signed)
rx faxed to pharmacy manually  

## 2011-06-06 NOTE — Telephone Encounter (Signed)
Fax is on your desk . 

## 2011-06-09 ENCOUNTER — Other Ambulatory Visit: Payer: Self-pay | Admitting: *Deleted

## 2011-06-09 NOTE — Telephone Encounter (Signed)
This was refilled 7/16 Please check into this

## 2011-06-09 NOTE — Telephone Encounter (Signed)
rx faxed 06/06/11

## 2011-06-10 ENCOUNTER — Other Ambulatory Visit: Payer: Self-pay | Admitting: *Deleted

## 2011-06-10 MED ORDER — ZOLPIDEM TARTRATE 10 MG PO TABS: 10.0000 mg | ORAL_TABLET | Freq: Every evening | ORAL | Status: DC | PRN

## 2011-06-10 NOTE — Telephone Encounter (Signed)
rx faxed to pharmacy manually  

## 2011-06-10 NOTE — Telephone Encounter (Signed)
Okay #90 x 0 

## 2011-06-10 NOTE — Telephone Encounter (Signed)
Faxed request from [facility] is on your desk.

## 2011-06-13 ENCOUNTER — Telehealth: Payer: Self-pay | Admitting: *Deleted

## 2011-06-13 NOTE — Telephone Encounter (Signed)
Pt is to have knee replacement surgery on 8/15 and he was told to stop his coumadin 7 days prior and celebrex and vitamin C 3 days prior.  They told him at his surgeon's office that you might want to put him on something else in the interim.  Also, he is asking if he should have his protime checked prior to surgery or after, he is due to have that checked on 8/15- same day as surgery.

## 2011-06-13 NOTE — Telephone Encounter (Signed)
Spoke with patient and advised results   

## 2011-06-13 NOTE — Telephone Encounter (Signed)
Actually when coumadin is used for atrial fibrillation, we just stop it without using blood thinning shots to fill in the gap before the actual surgery He should resume his coumadin immediately after his surgery and start checking protimes within a couple of days of restarting the coumadin---the surgeon will generally take care of this

## 2011-06-20 ENCOUNTER — Ambulatory Visit (INDEPENDENT_AMBULATORY_CARE_PROVIDER_SITE_OTHER): Payer: Medicare Other | Admitting: Family Medicine

## 2011-06-20 DIAGNOSIS — Z5181 Encounter for therapeutic drug level monitoring: Secondary | ICD-10-CM

## 2011-06-20 DIAGNOSIS — I4891 Unspecified atrial fibrillation: Secondary | ICD-10-CM

## 2011-06-20 DIAGNOSIS — R972 Elevated prostate specific antigen [PSA]: Secondary | ICD-10-CM

## 2011-06-20 DIAGNOSIS — Z7901 Long term (current) use of anticoagulants: Secondary | ICD-10-CM

## 2011-06-20 LAB — POCT INR: INR: 2.8

## 2011-06-20 NOTE — Patient Instructions (Addendum)
Continue current dose of 5 mg daily, 7.5 mg WED,SAT, Patient is scheduled for Right Knee replacement. He will be given dose  Instructions from his surgeon. He will call and schedule next INR when he is released from rehab after surgery

## 2011-06-21 LAB — PSA, TOTAL AND FREE
PSA, Free Pct: 19 % — ABNORMAL LOW (ref >25)
PSA, Free: 0.95 ng/mL
PSA: 4.93 ng/mL — ABNORMAL HIGH (ref <=4.00)

## 2011-06-21 NOTE — Progress Notes (Signed)
Please call him This is lower than last time and is not significantly higher than 4 years ago No further action needed now

## 2011-06-22 HISTORY — PX: REPLACEMENT UNICONDYLAR JOINT KNEE: SUR1227

## 2011-06-30 ENCOUNTER — Ambulatory Visit: Payer: Self-pay | Admitting: Unknown Physician Specialty

## 2011-07-06 ENCOUNTER — Other Ambulatory Visit: Payer: Self-pay | Admitting: *Deleted

## 2011-07-06 ENCOUNTER — Other Ambulatory Visit: Payer: Medicare Other

## 2011-07-06 ENCOUNTER — Inpatient Hospital Stay: Payer: Self-pay | Admitting: Unknown Physician Specialty

## 2011-07-06 NOTE — Telephone Encounter (Signed)
[facility]

## 2011-07-07 MED ORDER — HYDROCODONE-ACETAMINOPHEN 5-500 MG PO TABS: 1.0000 | ORAL_TABLET | Freq: Four times a day (QID) | ORAL | Status: DC | PRN

## 2011-07-07 MED ORDER — ALPRAZOLAM 1 MG PO TABS: 1.0000 mg | ORAL_TABLET | Freq: Two times a day (BID) | ORAL | Status: DC | PRN

## 2011-07-07 NOTE — Telephone Encounter (Signed)
#  90 zolpidem done in July so that is not ready to be refilled Others printed

## 2011-07-07 NOTE — Telephone Encounter (Signed)
rx called into pharmacy

## 2011-07-11 ENCOUNTER — Encounter: Payer: Self-pay | Admitting: Internal Medicine

## 2011-07-11 ENCOUNTER — Other Ambulatory Visit: Payer: Self-pay | Admitting: Internal Medicine

## 2011-07-11 ENCOUNTER — Telehealth: Payer: Self-pay | Admitting: *Deleted

## 2011-07-11 LAB — PROTIME-INR

## 2011-07-11 NOTE — Telephone Encounter (Signed)
Patient notified as instructed by telephone. Left a message for nurse at [facility] to call back.

## 2011-07-11 NOTE — Telephone Encounter (Signed)
Please call The INR is only 1.2 It may be too soon after restarting the coumadin Have him take an extra dose of his coumadin tonight (double his usual) then continue his typical dosing Please recheck in 1 week

## 2011-07-11 NOTE — Telephone Encounter (Signed)
PT/ INR results from [facility] are on your desk.  Pt is waiting for a phone call with instructions.

## 2011-07-12 ENCOUNTER — Telehealth: Payer: Self-pay | Admitting: *Deleted

## 2011-07-12 NOTE — Telephone Encounter (Signed)
Jasmine December at [facility] called back regarding patient's lab results regarding his protime. Jasmine December notified as instructed by telephone. Order given to recheck patient's protime in a week per Dr. Alphonsus Sias.

## 2011-07-14 ENCOUNTER — Encounter: Payer: Self-pay | Admitting: Internal Medicine

## 2011-07-18 ENCOUNTER — Encounter: Payer: Self-pay | Admitting: Internal Medicine

## 2011-07-22 ENCOUNTER — Ambulatory Visit: Payer: Self-pay | Admitting: Internal Medicine

## 2011-07-22 NOTE — Patient Instructions (Signed)
Patient having knee replacement, will call to schedule INR when he is able to start back here.

## 2011-08-10 ENCOUNTER — Other Ambulatory Visit: Payer: Self-pay | Admitting: *Deleted

## 2011-08-10 MED ORDER — HYDROCODONE-ACETAMINOPHEN 5-500 MG PO TABS: 1.0000 | ORAL_TABLET | Freq: Four times a day (QID) | ORAL | Status: DC | PRN

## 2011-08-10 MED ORDER — ALPRAZOLAM 1 MG PO TABS: 1.0000 mg | ORAL_TABLET | Freq: Two times a day (BID) | ORAL | Status: DC | PRN

## 2011-08-10 NOTE — Telephone Encounter (Signed)
rx faxed to pharmacy manually  

## 2011-08-10 NOTE — Telephone Encounter (Signed)
Form on your desk  

## 2011-08-10 NOTE — Telephone Encounter (Signed)
Okay #120 x 0 for hydrocodone and #60 x 0 for alprazolam

## 2011-09-07 ENCOUNTER — Telehealth: Payer: Self-pay | Admitting: *Deleted

## 2011-09-07 ENCOUNTER — Ambulatory Visit (INDEPENDENT_AMBULATORY_CARE_PROVIDER_SITE_OTHER): Payer: Medicare Other | Admitting: Internal Medicine

## 2011-09-07 DIAGNOSIS — I4891 Unspecified atrial fibrillation: Secondary | ICD-10-CM

## 2011-09-07 DIAGNOSIS — Z7901 Long term (current) use of anticoagulants: Secondary | ICD-10-CM

## 2011-09-07 DIAGNOSIS — Z5181 Encounter for therapeutic drug level monitoring: Secondary | ICD-10-CM

## 2011-09-07 LAB — POCT INR: INR: 2.6

## 2011-09-07 MED ORDER — MELOXICAM 7.5 MG PO TABS: 7.5000 mg | ORAL_TABLET | Freq: Every day | ORAL | Status: DC

## 2011-09-07 NOTE — Telephone Encounter (Signed)
Okay to discontinue the celebrex and send Rx for meloxicam 7.5mg  daily #30 x 5

## 2011-09-07 NOTE — Telephone Encounter (Signed)
Pt had knee replacement surgery with Dr. Gavin Potters about 3 months ago and after the surgery the doctor switched him from moloxicam to celebrex.  He had always taken meloxicam for his arthritis and other pains before and doesn't know why he was changed.  He would like to go back to taking the mobic, as it was less costly than celebrex.  He took 7.5 mg's a day.  [facility] [address]

## 2011-09-07 NOTE — Telephone Encounter (Signed)
Spoke with patient and advised results rx sent to pharmacy by e-script  

## 2011-09-07 NOTE — Patient Instructions (Addendum)
Continue current dose, check in 6 weeks

## 2011-09-12 ENCOUNTER — Other Ambulatory Visit: Payer: Self-pay | Admitting: *Deleted

## 2011-09-12 MED ORDER — ZOLPIDEM TARTRATE 10 MG PO TABS: 10.0000 mg | ORAL_TABLET | Freq: Every evening | ORAL | Status: DC | PRN

## 2011-09-12 NOTE — Telephone Encounter (Signed)
Okay #90 x 0 

## 2011-09-12 NOTE — Telephone Encounter (Signed)
rx called into pharmacy

## 2011-09-16 ENCOUNTER — Other Ambulatory Visit: Payer: Self-pay | Admitting: *Deleted

## 2011-09-16 MED ORDER — ALPRAZOLAM 1 MG PO TABS: 1.0000 mg | ORAL_TABLET | Freq: Two times a day (BID) | ORAL | Status: DC | PRN

## 2011-09-16 NOTE — Telephone Encounter (Signed)
Okay to phone in #60 x 1

## 2011-09-16 NOTE — Telephone Encounter (Signed)
[facility] [address] is on your desk. 

## 2011-09-16 NOTE — Telephone Encounter (Signed)
rx called into pharmacy

## 2011-09-19 ENCOUNTER — Other Ambulatory Visit: Payer: Self-pay | Admitting: *Deleted

## 2011-09-19 MED ORDER — HYDROCODONE-ACETAMINOPHEN 5-500 MG PO TABS: 1.0000 | ORAL_TABLET | Freq: Four times a day (QID) | ORAL | Status: DC | PRN

## 2011-09-19 NOTE — Telephone Encounter (Signed)
Last refilled 09/09/11

## 2011-09-19 NOTE — Telephone Encounter (Signed)
Okay #120 x 0 then

## 2011-09-19 NOTE — Telephone Encounter (Signed)
Last refill for hydrocodone was 08/10/2011, sorry.

## 2011-09-19 NOTE — Telephone Encounter (Signed)
rx called into pharmacy

## 2011-09-19 NOTE — Telephone Encounter (Signed)
Please find out why they are requesting refill so soon

## 2011-09-20 ENCOUNTER — Ambulatory Visit (INDEPENDENT_AMBULATORY_CARE_PROVIDER_SITE_OTHER): Payer: Medicare Other | Admitting: Internal Medicine

## 2011-09-20 ENCOUNTER — Encounter: Payer: Self-pay | Admitting: Internal Medicine

## 2011-09-20 VITALS — BP 143/78 | HR 80 | Temp 98.0°F | Wt 174.0 lb

## 2011-09-20 DIAGNOSIS — J309 Allergic rhinitis, unspecified: Secondary | ICD-10-CM

## 2011-09-20 MED ORDER — MONTELUKAST SODIUM 10 MG PO TABS: 10.0000 mg | ORAL_TABLET | Freq: Every day | ORAL | Status: DC

## 2011-09-20 MED ORDER — PREDNISONE 20 MG PO TABS: 40.0000 mg | ORAL_TABLET | Freq: Every day | ORAL | Status: AC

## 2011-09-20 NOTE — Progress Notes (Signed)
Subjective:    Patient ID: Mark Padilla, male    DOB: August 16, 1935, 76 y.o.   MRN: [id number]  HPI Has had persistent headache and drainage down throat since May Tried the flonase and it didn't really help Has seen Dr Andee Poles Full evaluation without clear diagnosis Tried astepro but not much help (and very expensive)  Has some cough with post nasal drip Fairly constant drainage though  Constant headache No fever Drainage is clear  Current Outpatient Prescriptions on File Prior to Visit  Medication Sig Dispense Refill  . ALPRAZolam (XANAX) 1 MG tablet Take 1 tablet (1 mg total) by mouth 2 (two) times daily as needed.  60 tablet  1  . Ascorbic Acid (VITAMIN C) 500 MG tablet Take 500 mg by mouth daily.        Marland Kitchen atenolol (TENORMIN) 25 MG tablet Take 25 mg by mouth daily.        . fluticasone (FLONASE) 50 MCG/ACT nasal spray Place 2 sprays into the nose daily. In each nostril  16 g  12  . gabapentin (NEURONTIN) 300 MG capsule Take 300 mg by mouth 3 (three) times daily.        . hydrochlorothiazide 25 MG tablet Take 1 tablet (25 mg total) by mouth daily.  90 tablet  3  . HYDROcodone-acetaminophen (VICODIN) 5-500 MG per tablet Take 1 tablet by mouth 4 (four) times daily as needed.  120 tablet  0  . meloxicam (MOBIC) 7.5 MG tablet Take 1 tablet (7.5 mg total) by mouth daily.  30 tablet  5  . Multiple Vitamin (MULTIVITAMIN) capsule Take 1 capsule by mouth daily.        Marland Kitchen omeprazole (PRILOSEC OTC) 20 MG tablet Take 20 mg by mouth daily.        . Potassium 75 MG TABS Take by mouth daily.        . sildenafil (VIAGRA) 100 MG tablet Take 100 mg by mouth daily as needed.        . warfarin (COUMADIN) 5 MG tablet TAKE 1 TABLET BY MOUTH EVERY OTHER DAY ALTERNATING WITH 1 & 1/2 TABLETS EVERY OTHER DAY  45 tablet  8  . zolpidem (AMBIEN) 10 MG tablet Take 1 tablet (10 mg total) by mouth at bedtime as needed.  90 tablet  0    Allergies  Allergen Reactions  . Oxycodone-Acetaminophen     REACTION:  unspecified  . Flexeril (Cyclobenzaprine Hcl) Anxiety  . Skelaxin Anxiety    Past Medical History  Diagnosis Date  . Allergy   . Hyperlipidemia   . Hypertension   . Diverticulitis   . Atrial fibrillation     chronic persistent, failed DCCV x3 in 2000  . ED (erectile dysfunction)   . Sleep disturbance   . Osteoarthritis   . Lumbar disc disease   . Cervical disc disease   . Impaired fasting glucose     Past Surgical History  Procedure Date  . Shoulder surgery 1950's    dislocation  . Knee arthroscopy 06/1998    right Gavin Potters)  . Hemiarthroplasty shoulder fracture 10/2004    left  . Shoulder surgery 12/2004    right  . Thr--left 4/12    Dr Gavin Potters  . Joint replacement 4/12    Left hip--Dr Gavin Potters  . Replacement unicondylar joint knee 8/12    Dr Gavin Potters    Family History  Problem Relation Age of Onset  . Cancer Mother     stomach cancer  . Stroke  Father     History   Social History  . Marital Status: Married    Spouse Name: N/A    Number of Children: 2  . Years of Education: N/A   Occupational History  . retired, [employer] works part time [employer]    Social History Main Topics  . Smoking status: Former Smoker    Types: Cigarettes    Quit date: 11/21/1962  . Smokeless tobacco: Not on file  . Alcohol Use: Yes     occasional  . Drug Use: No  . Sexually Active: Not on file   Other Topics Concern  . Not on file   Social History Narrative  . No narrative on file   Review of Systems Eating okay---weight is up some Moved in with daughter--thinks there was mold in the apt where he was (but this was 3 months ago) Recovering from right TKR fairly well==but now with detached muscle by left THR     Objective:   Physical Exam  Constitutional: He appears well-developed and well-nourished. No distress.  HENT:  Head: Normocephalic and atraumatic.  Right Ear: External ear normal.  Left Ear: External ear normal.  Mouth/Throat: Oropharynx is  clear and moist. No oropharyngeal exudate.       Mild frontal tenderness Moderate mostly pale nasal congestion  Neck: Normal range of motion.  Pulmonary/Chest: Effort normal and breath sounds normal. No respiratory distress. He has no wheezes. He has no rales.  Lymphadenopathy:    He has no cervical adenopathy.          Assessment & Plan:

## 2011-09-20 NOTE — Patient Instructions (Signed)
Please take the prednisone for 10 days as directed. Continue the loratadine and fluticasone. Start the montelukast today. Call if you are not any better within 2-3 weeks

## 2011-09-20 NOTE — Assessment & Plan Note (Signed)
Seems to be the cause of ongoing symptoms Doesn't seem to be infectious Will continue flonase and loratadine Will give burst of prednisone Start montelukast also

## 2011-10-19 ENCOUNTER — Ambulatory Visit: Payer: Medicare Other

## 2011-10-28 ENCOUNTER — Other Ambulatory Visit: Payer: Self-pay | Admitting: *Deleted

## 2011-10-28 ENCOUNTER — Ambulatory Visit (INDEPENDENT_AMBULATORY_CARE_PROVIDER_SITE_OTHER): Payer: Medicare Other | Admitting: Internal Medicine

## 2011-10-28 ENCOUNTER — Encounter: Payer: Self-pay | Admitting: Internal Medicine

## 2011-10-28 DIAGNOSIS — I4891 Unspecified atrial fibrillation: Secondary | ICD-10-CM

## 2011-10-28 DIAGNOSIS — I10 Essential (primary) hypertension: Secondary | ICD-10-CM

## 2011-10-28 DIAGNOSIS — M159 Polyosteoarthritis, unspecified: Secondary | ICD-10-CM

## 2011-10-28 DIAGNOSIS — J309 Allergic rhinitis, unspecified: Secondary | ICD-10-CM

## 2011-10-28 DIAGNOSIS — Z5181 Encounter for therapeutic drug level monitoring: Secondary | ICD-10-CM

## 2011-10-28 DIAGNOSIS — Z7901 Long term (current) use of anticoagulants: Secondary | ICD-10-CM

## 2011-10-28 DIAGNOSIS — G479 Sleep disorder, unspecified: Secondary | ICD-10-CM

## 2011-10-28 LAB — POCT INR: INR: 2.7

## 2011-10-28 MED ORDER — HYDROCODONE-ACETAMINOPHEN 5-500 MG PO TABS: 1.0000 | ORAL_TABLET | Freq: Four times a day (QID) | ORAL | Status: DC | PRN

## 2011-10-28 MED ORDER — AMOXICILLIN-POT CLAVULANATE 875-125 MG PO TABS: 1.0000 | ORAL_TABLET | Freq: Two times a day (BID) | ORAL | Status: AC

## 2011-10-28 NOTE — Progress Notes (Signed)
Addended by: Tillman Abide I on: 10/28/2011 10:05 AM   Modules accepted: Orders

## 2011-10-28 NOTE — Assessment & Plan Note (Signed)
Zolpidem not working Asked him to stop and use only 1-2 per week at most Okay to continue the xanax

## 2011-10-28 NOTE — Assessment & Plan Note (Signed)
Ongoing symptoms May indicate bacterial infection  Will try antibiotic

## 2011-10-28 NOTE — Assessment & Plan Note (Signed)
Still uses the hydrocodone regularly

## 2011-10-28 NOTE — Assessment & Plan Note (Signed)
Good rate control On coumadin No changes

## 2011-10-28 NOTE — Patient Instructions (Signed)
5 mg daily, 7.5 mg WED,SAT recheck 6 weeks

## 2011-10-28 NOTE — Assessment & Plan Note (Signed)
BP Readings from Last 3 Encounters:  10/28/11 128/81  09/20/11 143/78  04/28/11 140/80   Good control No changes needed

## 2011-10-28 NOTE — Progress Notes (Signed)
Subjective:    Patient ID: Mark Padilla, male    DOB: 1935/01/22, 76 y.o.   MRN: [id number]  HPI Sinuses are not better Still with continuous drainage despite singulair and nasal spray Not sick No fever No sig cough--occ brings up phlegm. Does have PND Some sore throat due to drainage  No heart trouble No palpitations ---aware of irregularity when checks BP Does note heart faster when exercising No chest pain Notes some trouble with breathing due to drainage and congestion No change in exercise tolerance  Ongoing knee pain Just saw Dr Gavin Potters for ongoing knee pain Able to walk okay with it Uses hydrocodone up to 2 a day usually  Sleep is still not good Sporadic restless nights with freq awakening Takes zolpidem every night---discussed trying to stop this or use intermittently  Still uses xanax daily at bedtime  Current Outpatient Prescriptions on File Prior to Visit  Medication Sig Dispense Refill  . ALPRAZolam (XANAX) 1 MG tablet Take 1 tablet (1 mg total) by mouth 2 (two) times daily as needed.  60 tablet  1  . Ascorbic Acid (VITAMIN C) 500 MG tablet Take 500 mg by mouth daily.        Marland Kitchen atenolol (TENORMIN) 25 MG tablet Take 25 mg by mouth daily.        . fluticasone (FLONASE) 50 MCG/ACT nasal spray Place 2 sprays into the nose daily. In each nostril  16 g  12  . gabapentin (NEURONTIN) 300 MG capsule Take 300 mg by mouth 3 (three) times daily.        . hydrochlorothiazide 25 MG tablet Take 1 tablet (25 mg total) by mouth daily.  90 tablet  3  . HYDROcodone-acetaminophen (VICODIN) 5-500 MG per tablet Take 1 tablet by mouth 4 (four) times daily as needed.  120 tablet  0  . loratadine (CLARITIN) 10 MG tablet Take 20 mg by mouth daily.        . meloxicam (MOBIC) 7.5 MG tablet Take 1 tablet (7.5 mg total) by mouth daily.  30 tablet  5  . montelukast (SINGULAIR) 10 MG tablet Take 1 tablet (10 mg total) by mouth at bedtime.  30 tablet  11  . Multiple Vitamin (MULTIVITAMIN)  capsule Take 1 capsule by mouth daily.        Marland Kitchen omeprazole (PRILOSEC OTC) 20 MG tablet Take 20 mg by mouth daily.        . Potassium 75 MG TABS Take by mouth daily.        . sildenafil (VIAGRA) 100 MG tablet Take 100 mg by mouth daily as needed.        . warfarin (COUMADIN) 5 MG tablet TAKE 1 TABLET BY MOUTH EVERY OTHER DAY ALTERNATING WITH 1 & 1/2 TABLETS EVERY OTHER DAY  45 tablet  8  . zolpidem (AMBIEN) 10 MG tablet Take 1 tablet (10 mg total) by mouth at bedtime as needed.  90 tablet  0    Allergies  Allergen Reactions  . Oxycodone-Acetaminophen     REACTION: unspecified  . Flexeril (Cyclobenzaprine Hcl) Anxiety  . Skelaxin Anxiety    Past Medical History  Diagnosis Date  . Allergy   . Hyperlipidemia   . Hypertension   . Diverticulitis   . Atrial fibrillation     chronic persistent, failed DCCV x3 in 2000  . ED (erectile dysfunction)   . Sleep disturbance   . Osteoarthritis   . Lumbar disc disease   . Cervical disc disease   .  Impaired fasting glucose     Past Surgical History  Procedure Date  . Shoulder surgery 1950's    dislocation  . Knee arthroscopy 06/1998    right Gavin Potters)  . Hemiarthroplasty shoulder fracture 10/2004    left  . Shoulder surgery 12/2004    right  . Thr--left 4/12    Dr Gavin Potters  . Joint replacement 4/12    Left hip--Dr Gavin Potters  . Replacement unicondylar joint knee 8/12    Dr Gavin Potters    Family History  Problem Relation Age of Onset  . Cancer Mother     stomach cancer  . Stroke Father     History   Social History  . Marital Status: Married    Spouse Name: N/A    Number of Children: 2  . Years of Education: N/A   Occupational History  . retired, [employer] works part time [employer]    Social History Main Topics  . Smoking status: Former Smoker    Types: Cigarettes    Quit date: 11/21/1962  . Smokeless tobacco: Never Used  . Alcohol Use: Yes     occasional  . Drug Use: No  . Sexually Active: Not on file    Other Topics Concern  . Not on file   Social History Narrative  . No narrative on file   Review of Systems Appetite is okay Weight is stable     Objective:   Physical Exam  Constitutional: He appears well-developed and well-nourished. No distress.  Neck: Normal range of motion. Neck supple. No thyromegaly present.  Cardiovascular: Normal rate, normal heart sounds and intact distal pulses.  Exam reveals no gallop.   No murmur heard.      irregular  Pulmonary/Chest: Effort normal and breath sounds normal. No respiratory distress. He has no wheezes. He has no rales.  Musculoskeletal: He exhibits no edema and no tenderness.  Lymphadenopathy:    He has no cervical adenopathy.  Psychiatric: He has a normal mood and affect. His behavior is normal. Judgment and thought content normal.          Assessment & Plan:

## 2011-11-07 ENCOUNTER — Telehealth: Payer: Self-pay | Admitting: *Deleted

## 2011-11-07 DIAGNOSIS — J309 Allergic rhinitis, unspecified: Secondary | ICD-10-CM

## 2011-11-07 NOTE — Telephone Encounter (Signed)
Pt seen in office 12.07.12 for Allergic Rhinitis, not better; request new Rx and/or does he need OV/referral to allergist.?

## 2011-11-07 NOTE — Telephone Encounter (Signed)
Probably time to see an allergist I will make referral

## 2011-11-08 NOTE — Telephone Encounter (Signed)
Spoke with patient and advised results   

## 2011-11-22 HISTORY — PX: COLONOSCOPY: SHX174

## 2011-12-03 ENCOUNTER — Other Ambulatory Visit: Payer: Self-pay | Admitting: Internal Medicine

## 2011-12-09 ENCOUNTER — Ambulatory Visit (INDEPENDENT_AMBULATORY_CARE_PROVIDER_SITE_OTHER): Payer: Medicare Other | Admitting: Internal Medicine

## 2011-12-09 DIAGNOSIS — Z7901 Long term (current) use of anticoagulants: Secondary | ICD-10-CM

## 2011-12-09 DIAGNOSIS — Z5181 Encounter for therapeutic drug level monitoring: Secondary | ICD-10-CM

## 2011-12-09 DIAGNOSIS — I4891 Unspecified atrial fibrillation: Secondary | ICD-10-CM

## 2011-12-09 LAB — POCT INR: INR: 3.4

## 2011-12-09 NOTE — Patient Instructions (Signed)
Continue current dose, check in 6 weeks, will eat more greens, dieting

## 2011-12-23 ENCOUNTER — Other Ambulatory Visit: Payer: Self-pay | Admitting: *Deleted

## 2011-12-23 MED ORDER — ALPRAZOLAM 1 MG PO TABS: 1.0000 mg | ORAL_TABLET | Freq: Two times a day (BID) | ORAL | Status: DC | PRN

## 2011-12-23 NOTE — Telephone Encounter (Signed)
Okay #60 x 1 

## 2011-12-23 NOTE — Telephone Encounter (Signed)
rx called into pharmacy

## 2011-12-28 ENCOUNTER — Ambulatory Visit: Payer: Self-pay | Admitting: Unknown Physician Specialty

## 2012-01-16 ENCOUNTER — Telehealth: Payer: Self-pay

## 2012-01-16 ENCOUNTER — Encounter: Payer: Self-pay | Admitting: Gastroenterology

## 2012-01-16 ENCOUNTER — Ambulatory Visit (INDEPENDENT_AMBULATORY_CARE_PROVIDER_SITE_OTHER): Payer: Medicare Other | Admitting: Gastroenterology

## 2012-01-16 VITALS — BP 134/76 | HR 60 | Ht 67.0 in | Wt 178.0 lb

## 2012-01-16 DIAGNOSIS — Z7901 Long term (current) use of anticoagulants: Secondary | ICD-10-CM

## 2012-01-16 DIAGNOSIS — Z8371 Family history of colonic polyps: Secondary | ICD-10-CM

## 2012-01-16 DIAGNOSIS — Z1211 Encounter for screening for malignant neoplasm of colon: Secondary | ICD-10-CM

## 2012-01-16 MED ORDER — PEG-KCL-NACL-NASULF-NA ASC-C 100 G PO SOLR: 1.0000 | Freq: Once | ORAL | Status: DC

## 2012-01-16 NOTE — Progress Notes (Signed)
History of Present Illness: This is a 77 year old male with a brother who developed colon polyps. Patient's last colonoscopy was in 2006 showing a hyperplastic colon polyp diverticulosis. He has mild GERD is well controlled with omeprazole. In addition he has stable atrial fibrillation maintained on Coumadin, hyperlipidemia and hypertension. Denies weight loss, abdominal pain, constipation, diarrhea, change in stool caliber, melena, hematochezia, nausea, vomiting, dysphagia, reflux symptoms, chest pain.  Review of Systems: Pertinent positive and negative review of systems were noted in the above HPI section. All other review of systems were otherwise negative.  Current Medications, Allergies, Past Medical History, Past Surgical History, Family History and Social History were reviewed in [employer] record.  Physical Exam: General: Well developed , well nourished, no acute distress Head: Normocephalic and atraumatic Eyes:  sclerae anicteric, EOMI Ears: Normal auditory acuity Mouth: No deformity or lesions Neck: Supple, no masses or thyromegaly Lungs: Clear throughout to auscultation Heart: Regular rate and rhythm; no murmurs, rubs or bruits Abdomen: Soft, non tender and non distended. No masses, hepatosplenomegaly or hernias noted. Normal Bowel sounds Rectal: Deferred to colonoscopy Musculoskeletal: Symmetrical with no gross deformities  Skin: No lesions on visible extremities Pulses:  Normal pulses noted Extremities: No clubbing, cyanosis, edema or deformities noted Neurological: Alert oriented x 4, grossly nonfocal Cervical Nodes:  No significant cervical adenopathy Inguinal Nodes: No significant inguinal adenopathy Psychological:  Alert and cooperative. Normal mood and affect  Assessment and Recommendations:  1. Colorectal cancer screening, elevated risk, brother with colon polyps. The risks, benefits and alternatives to 5 day hold Coumadin were discussed with the  patient and he consents to proceed. Obtain clearance from his cardiologist. The risks, benefits, and alternatives to colonoscopy with possible biopsy and possible polypectomy were discussed with the patient and they consent to proceed.

## 2012-01-16 NOTE — Patient Instructions (Signed)
You have been scheduled for a colonoscopy with propofol. Please follow written instructions given to you at your visit today.  Please pick up your prep kit at the pharmacy within the next 1-3 days.  You will be contaced by our office prior to your procedure for directions on holding your Coumadin/Warfarin.  If you do not hear from our office 1 week prior to your scheduled procedure, please call [phone] to discuss.  cc: Tillman Abide MD

## 2012-01-16 NOTE — Telephone Encounter (Signed)
Do we have a spot for him on schedule late this week? He needs instruction on lovenox when he comes off his warfarin for procedure 3/8 Off warfarin 5 days

## 2012-01-16 NOTE — Telephone Encounter (Signed)
  01/16/2012    RE: Mark Padilla DOB: 06/07/1935 MRN: [id number]  Dear Dr. Mariah Milling,   We have scheduled the above patient for an endoscopic procedure. Our records show that he is on anticoagulation therapy.   Please advise as to how long the patient may come off his therapy of coumadin prior to the procedure, which is scheduled for 01/27/12.  Please fax back/ or route the completed form to [facility] at [phone].   Sincerely,  [place], Clayborne Dana        Please respond by 01/20/12 and route back to  Christie Nottingham CMA

## 2012-01-17 ENCOUNTER — Telehealth: Payer: Self-pay | Admitting: *Deleted

## 2012-01-17 NOTE — Telephone Encounter (Signed)
Just want to be sure, pt is using taking the injection qd or bid? If i'm correct he should use the injection for 10 days? And you asked to order 12 syringes. Sorry for the confusion

## 2012-01-17 NOTE — Telephone Encounter (Signed)
We prefer that the MD monitoring his Coumadin do the Lovenox dosing.  If his PCP does not do Lovenox dosing, can be done by our office but pt will have to come to the [place] office as there is no space in the [place] schedule this week.

## 2012-01-17 NOTE — Telephone Encounter (Signed)
Patient has coumadin checked by Primary Care, do we need to notify them of request made by DR. Gollan or do they need to see you in the coumadin clinic?

## 2012-01-17 NOTE — Telephone Encounter (Signed)
Also his procedure is 01/27/12, so he will start holding Sunday 01/22/12

## 2012-01-17 NOTE — Telephone Encounter (Signed)
GI calling asking if we could order pt's Lovenox after his endoscopy?

## 2012-01-17 NOTE — Telephone Encounter (Signed)
Yes  He needs to stop 5 days before the test Start lovenox 120mg  SQ right away (the first day he holds the coumadin) He should restart the coumadin right after the test but continue the lovenox until his coumadin level is back up again (may take 5 days or more) Send Rx for #12 (lovenox 120mg  doses) He should recheck his protime 4-5 days after restarting the coumadin

## 2012-01-17 NOTE — Telephone Encounter (Signed)
Patti,  Can the lovenox dosing be done at primary care? Please see below

## 2012-01-17 NOTE — Telephone Encounter (Signed)
Spoke with Dr. Karle Starch nurse and she states she will contact the patient to set Lovenox bridge injections per Dr. Alphonsus Sias. They will contact and instruct the patient on how to administer these injections. Informed patient that they will be hearing from there office regarding Lovenex. Pt agreed and also informed patient to contact Dr. Karle Starch office if he does not hear from them by Friday.

## 2012-01-18 MED ORDER — ENOXAPARIN SODIUM 120 MG/0.8ML ~~LOC~~ SOLN: 120.0000 mg | SUBCUTANEOUS | Status: DC

## 2012-01-18 NOTE — Telephone Encounter (Signed)
Daily Make sure he knows how to do the injection (can come here for the first one prn)  I am not sure how many days he will need it after the procedure---could be more than 5. That is why I asked for #12 doses (then he won't need another Rx if not at proper level after 5 days)

## 2012-01-18 NOTE — Telephone Encounter (Signed)
Spoke with male/ wife?and advised results and instructions, pt will come in for PT/INR on Friday and if he has any questions I will go over instructions again. rx sent to pharmacy by e-script

## 2012-01-20 ENCOUNTER — Ambulatory Visit (INDEPENDENT_AMBULATORY_CARE_PROVIDER_SITE_OTHER): Payer: Medicare Other | Admitting: Internal Medicine

## 2012-01-20 DIAGNOSIS — Z7901 Long term (current) use of anticoagulants: Secondary | ICD-10-CM

## 2012-01-20 DIAGNOSIS — I4891 Unspecified atrial fibrillation: Secondary | ICD-10-CM

## 2012-01-20 DIAGNOSIS — Z5181 Encounter for therapeutic drug level monitoring: Secondary | ICD-10-CM

## 2012-01-20 LAB — POCT INR: INR: 4.6

## 2012-01-20 NOTE — Patient Instructions (Signed)
Stop coumadin today, start lovenox Monday, recheck thurs.

## 2012-01-26 ENCOUNTER — Ambulatory Visit (INDEPENDENT_AMBULATORY_CARE_PROVIDER_SITE_OTHER): Payer: Medicare Other | Admitting: Internal Medicine

## 2012-01-26 DIAGNOSIS — Z5181 Encounter for therapeutic drug level monitoring: Secondary | ICD-10-CM

## 2012-01-26 DIAGNOSIS — Z7901 Long term (current) use of anticoagulants: Secondary | ICD-10-CM

## 2012-01-26 DIAGNOSIS — I4891 Unspecified atrial fibrillation: Secondary | ICD-10-CM

## 2012-01-26 LAB — POCT INR: INR: 1.2

## 2012-01-26 NOTE — Patient Instructions (Signed)
After procedure esume former dose of 5 mg daily, 7.5 mg wed, and sat recheck 2 weeks

## 2012-01-27 ENCOUNTER — Encounter: Payer: Self-pay | Admitting: Gastroenterology

## 2012-01-27 ENCOUNTER — Ambulatory Visit (AMBULATORY_SURGERY_CENTER): Payer: Medicare Other | Admitting: Gastroenterology

## 2012-01-27 VITALS — BP 125/64 | HR 67 | Temp 98.5°F | Resp 20 | Ht 67.0 in | Wt 178.0 lb

## 2012-01-27 DIAGNOSIS — D126 Benign neoplasm of colon, unspecified: Secondary | ICD-10-CM

## 2012-01-27 DIAGNOSIS — Z83719 Family history of colon polyps, unspecified: Secondary | ICD-10-CM

## 2012-01-27 DIAGNOSIS — Z1211 Encounter for screening for malignant neoplasm of colon: Secondary | ICD-10-CM

## 2012-01-27 DIAGNOSIS — Z8371 Family history of colonic polyps: Secondary | ICD-10-CM

## 2012-01-27 DIAGNOSIS — Z7901 Long term (current) use of anticoagulants: Secondary | ICD-10-CM

## 2012-01-27 MED ORDER — SODIUM CHLORIDE 0.9 % IV SOLN: 500.0000 mL | INTRAVENOUS | Status: DC

## 2012-01-27 NOTE — Progress Notes (Signed)
Patient did not experience any of the following events: a burn prior to discharge; a fall within the facility; wrong site/side/patient/procedure/implant event; or a hospital transfer or hospital admission upon discharge from the facility. (G8907) Patient did not have preoperative order for IV antibiotic SSI prophylaxis. (G8918)  

## 2012-01-27 NOTE — Op Note (Signed)
[facility] [address] [place], [place]  [address]  COLONOSCOPY PROCEDURE REPORT PATIENT:  Mark Padilla, Mark Padilla  MR#:  [id number] BIRTHDATE:  October 12, 1935, 77 yrs. old  GENDER:  male ENDOSCOPIST:  Judie Petit T. Russella Dar, MD, [facility]  PROCEDURE DATE:  01/27/2012 PROCEDURE:  Colonoscopy with biopsy ASA CLASS:  Class III INDICATIONS:  1) Elevated Risk Screening  2) family Hx of polyps: brother MEDICATIONS:   MAC sedation, administered by CRNA, propofol (Diprivan) 160 mg IV DESCRIPTION OF PROCEDURE:   After the risks benefits and alternatives of the procedure were thoroughly explained, informed consent was obtained.  Digital rectal exam was performed and revealed no abnormalities.   The LB CF-[id number] endoscope was introduced through the anus and advanced to the cecum, which was identified by both the appendix and ileocecal valve, without limitations.  The quality of the prep was good, using MoviPrep. The instrument was then slowly withdrawn as the colon was fully examined. <<PROCEDUREIMAGES>> FINDINGS:  Two polyps were found in the cecum. They were 4 - 5 mm in size. The polyps were removed using cold biopsy forceps. Moderate diverticulosis was found in the sigmoid colon.  Otherwise normal colonoscopy without other polyps, masses, vascular ectasias, or inflammatory changes.  Retroflexed views in the rectum revealed no abnormalities.  The time to cecum =  3 minutes. The scope was then withdrawn (time =  10  min) from the patient and the procedure completed.  COMPLICATIONS:  None  ENDOSCOPIC IMPRESSION: 1) 4 - 5 mm Two polyps in the cecum 2) Moderate diverticulosis in the sigmoid colon  RECOMMENDATIONS: 1) Hold aspirin, aspirin products, and anti-inflammatory medication for 2 weeks. 2) Await pathology results 3) High fiber diet with liberal fluid intake. 4) Resume Coumadin (warfarin) today and have your PT/INR checked within 1 week. 5) Repeat Colonoscopy in 5  years  Linn Clavin T. Russella Dar, MD, [facility]  n. eSIGNED:   Venita Lick. Camren Henthorn at 01/27/2012 11:27 AM  Edythe Lynn, [phone]

## 2012-01-27 NOTE — Patient Instructions (Signed)
YOU HAD AN ENDOSCOPIC PROCEDURE TODAY AT THE [facility]: Refer to the procedure report that was given to you for any specific questions about what was found during the examination.  If the procedure report does not answer your questions, please call your gastroenterologist to clarify.  If you requested that your care partner not be given the details of your procedure findings, then the procedure report has been included in a sealed envelope for you to review at your convenience later.  YOU SHOULD EXPECT: Some feelings of bloating in the abdomen. Passage of more gas than usual.  Walking can help get rid of the air that was put into your GI tract during the procedure and reduce the bloating. If you had a lower endoscopy (such as a colonoscopy or flexible sigmoidoscopy) you may notice spotting of blood in your stool or on the toilet paper. If you underwent a bowel prep for your procedure, then you may not have a normal bowel movement for a few days.  DIET: Your first meal following the procedure should be a light meal and then it is ok to progress to your normal diet.  A half-sandwich or bowl of soup is an example of a good first meal.  Heavy or fried foods are harder to digest and may make you feel nauseous or bloated.  Likewise meals heavy in dairy and vegetables can cause extra gas to form and this can also increase the bloating.  Drink plenty of fluids but you should avoid alcoholic beverages for 24 hours.  ACTIVITY: Your care partner should take you home directly after the procedure.  You should plan to take it easy, moving slowly for the rest of the day.  You can resume normal activity the day after the procedure however you should NOT DRIVE or use heavy machinery for 24 hours (because of the sedation medicines used during the test).    SYMPTOMS TO REPORT IMMEDIATELY: A gastroenterologist can be reached at any hour.  During normal business hours, 8:30 AM to 5:00 PM Monday through Friday,  call [phone].  After hours and on weekends, please call the GI answering service at [phone] who will take a message and have the physician on call contact you.   Following lower endoscopy (colonoscopy or flexible sigmoidoscopy):  Excessive amounts of blood in the stool  Significant tenderness or worsening of abdominal pains  Swelling of the abdomen that is new, acute  Fever of 100F or higher  Following upper endoscopy (EGD)  Vomiting of blood or coffee ground material  New chest pain or pain under the shoulder blades  Painful or persistently difficult swallowing  New shortness of breath  Fever of 100F or higher  Black, tarry-looking stools  FOLLOW UP: If any biopsies were taken you will be contacted by phone or by letter within the next 1-3 weeks.  Call your gastroenterologist if you have not heard about the biopsies in 3 weeks.  Our staff will call the home number listed on your records the next business day following your procedure to check on you and address any questions or concerns that you may have at that time regarding the information given to you following your procedure. This is a courtesy call and so if there is no answer at the home number and we have not heard from you through the emergency physician on call, we will assume that you have returned to your regular daily activities without incident.  SIGNATURES/CONFIDENTIALITY: You and/or your care   partner have signed paperwork which will be entered into your electronic medical record.  These signatures attest to the fact that that the information above on your After Visit Summary has been reviewed and is understood.  Full responsibility of the confidentiality of this discharge information lies with you and/or your care-partner.  

## 2012-01-30 ENCOUNTER — Telehealth: Payer: Self-pay | Admitting: *Deleted

## 2012-01-30 NOTE — Telephone Encounter (Signed)
  Follow up Call-  Call back number 01/27/2012  Post procedure Call Back phone  # [phone]  Permission to leave phone message Yes     Patient questions:  Do you have a fever, pain , or abdominal swelling? no Pain Score  0 *  Have you tolerated food without any problems? yes  Have you been able to return to your normal activities? yes  Do you have any questions about your discharge instructions: Diet   no Medications  no Follow up visit  no  Do you have questions or concerns about your Care? no  Actions: * If pain score is 4 or above: No action needed, pain <4.

## 2012-01-31 ENCOUNTER — Ambulatory Visit (INDEPENDENT_AMBULATORY_CARE_PROVIDER_SITE_OTHER): Payer: Medicare Other | Admitting: Family Medicine

## 2012-01-31 DIAGNOSIS — I4891 Unspecified atrial fibrillation: Secondary | ICD-10-CM

## 2012-01-31 DIAGNOSIS — Z7901 Long term (current) use of anticoagulants: Secondary | ICD-10-CM

## 2012-01-31 DIAGNOSIS — Z5181 Encounter for therapeutic drug level monitoring: Secondary | ICD-10-CM

## 2012-01-31 LAB — POCT INR: INR: 1.5

## 2012-01-31 NOTE — Patient Instructions (Signed)
Take 7.5 mg daily recheck in 3 days, continue Lovenox injections

## 2012-02-02 NOTE — Progress Notes (Signed)
FYI

## 2012-02-03 ENCOUNTER — Ambulatory Visit (INDEPENDENT_AMBULATORY_CARE_PROVIDER_SITE_OTHER): Payer: Medicare Other | Admitting: Family Medicine

## 2012-02-03 DIAGNOSIS — I4891 Unspecified atrial fibrillation: Secondary | ICD-10-CM

## 2012-02-03 DIAGNOSIS — Z7901 Long term (current) use of anticoagulants: Secondary | ICD-10-CM

## 2012-02-03 DIAGNOSIS — Z5181 Encounter for therapeutic drug level monitoring: Secondary | ICD-10-CM

## 2012-02-03 LAB — POCT INR
INR: 3.4
INR: 3.4

## 2012-02-03 NOTE — Patient Instructions (Signed)
Stop lovenox injections and and resume 5 mg daily except 7.5 tues, Fri, recheck 2 weeks

## 2012-02-04 ENCOUNTER — Encounter: Payer: Self-pay | Admitting: Gastroenterology

## 2012-02-07 ENCOUNTER — Other Ambulatory Visit: Payer: Self-pay | Admitting: *Deleted

## 2012-02-07 MED ORDER — HYDROCODONE-ACETAMINOPHEN 5-500 MG PO TABS: 1.0000 | ORAL_TABLET | Freq: Four times a day (QID) | ORAL | Status: DC | PRN

## 2012-02-07 NOTE — Telephone Encounter (Signed)
rx called into pharmacy

## 2012-02-07 NOTE — Telephone Encounter (Signed)
Okay #90 x 0 

## 2012-02-15 ENCOUNTER — Ambulatory Visit (INDEPENDENT_AMBULATORY_CARE_PROVIDER_SITE_OTHER): Payer: Medicare Other | Admitting: Internal Medicine

## 2012-02-15 DIAGNOSIS — Z7901 Long term (current) use of anticoagulants: Secondary | ICD-10-CM

## 2012-02-15 DIAGNOSIS — Z5181 Encounter for therapeutic drug level monitoring: Secondary | ICD-10-CM

## 2012-02-15 DIAGNOSIS — I4891 Unspecified atrial fibrillation: Secondary | ICD-10-CM

## 2012-02-15 LAB — POCT INR: INR: 2.9

## 2012-02-15 NOTE — Patient Instructions (Signed)
Continue 5 mg daily except 7.5 tues, Fri, recheck 4 weeks

## 2012-02-22 ENCOUNTER — Encounter: Payer: Self-pay | Admitting: Cardiovascular Disease

## 2012-02-22 ENCOUNTER — Ambulatory Visit (INDEPENDENT_AMBULATORY_CARE_PROVIDER_SITE_OTHER): Payer: Medicare Other | Admitting: Cardiovascular Disease

## 2012-02-22 VITALS — BP 120/78 | HR 64 | Resp 16 | Ht 67.0 in | Wt 178.0 lb

## 2012-02-22 DIAGNOSIS — E785 Hyperlipidemia, unspecified: Secondary | ICD-10-CM

## 2012-02-22 DIAGNOSIS — I10 Essential (primary) hypertension: Secondary | ICD-10-CM

## 2012-02-22 DIAGNOSIS — I4891 Unspecified atrial fibrillation: Secondary | ICD-10-CM

## 2012-02-22 MED ORDER — SIMVASTATIN 20 MG PO TABS: 20.0000 mg | ORAL_TABLET | Freq: Every evening | ORAL | Status: DC

## 2012-02-22 NOTE — Progress Notes (Signed)
Patient ID: Mark Padilla, male    DOB: 12/01/1934, 77 y.o.   MRN: [id number]  HPI Comments: 77 year-old gentleman with chronic  atrial fibrillation and HTN, remote hx of  DC cardioversion that was unsuccessful, on rate-control and anticoagulation for the past several years. Also with hyperlipidemia. He has had a recent hip replacement on the left in April 2012, right knee replacement August 2012. He reports that he was previously on Zocor and this was stopped after he had significant weight loss.    Mr. Lepkowski has multiple complaints today. He continues to have a slipped disc in his neck that he believes is causing headaches. His headaches are better with Vicodin. No significant episodes of dizziness. He has been walking on a regular basis, typically less than 2 miles per day with no symptoms of shortness of breath or chest pain.    He also has a history of bilateral shoulder surgery, replacement. He has a history of arthroscopic knee surgery on the right and feels that his right knee is having problems again.   He denies chest pain, shortness of breath, edema, or palpitations. no lightheadedness or near syncope.   EKG shows atrial fibrillation with rate of 64 beats per minute, consider old anteroseptal infarct      Outpatient Encounter Prescriptions as of 02/22/2012  Medication Sig Dispense Refill  . ALPRAZolam (XANAX) 1 MG tablet Take 1 tablet (1 mg total) by mouth 2 (two) times daily as needed.  60 tablet  1  . Ascorbic Acid (VITAMIN C) 500 MG tablet Take 500 mg by mouth daily.        Marland Kitchen atenolol (TENORMIN) 25 MG tablet TAKE 1 TABLET BY MOUTH EVERY DAY  30 tablet  3  . fluticasone (FLONASE) 50 MCG/ACT nasal spray Place 2 sprays into the nose daily. In each nostril  16 g  12  . gabapentin (NEURONTIN) 300 MG capsule Take 300 mg by mouth 3 (three) times daily.        . hydrochlorothiazide 25 MG tablet Take 1 tablet (25 mg total) by mouth daily.  90 tablet  3  . HYDROcodone-acetaminophen  (VICODIN) 5-500 MG per tablet Take 1 tablet by mouth 4 (four) times daily as needed.  90 tablet  0  . loratadine (CLARITIN) 10 MG tablet Take 20 mg by mouth daily.        . meloxicam (MOBIC) 7.5 MG tablet Take 1 tablet (7.5 mg total) by mouth daily.  30 tablet  5  . montelukast (SINGULAIR) 10 MG tablet Take 1 tablet (10 mg total) by mouth at bedtime.  30 tablet  11  . Multiple Vitamin (MULTIVITAMIN) capsule Take 1 capsule by mouth daily.        Marland Kitchen omeprazole (PRILOSEC) 40 MG capsule Take 40 mg by mouth daily.      . Potassium 75 MG TABS Take by mouth daily.        Marland Kitchen warfarin (COUMADIN) 5 MG tablet TAKE 1 TABLET BY MOUTH EVERY OTHER DAY ALTERNATING WITH 1 & 1/2 TABLETS EVERY OTHER DAY  45 tablet  8    Review of Systems  Constitutional: Negative.   HENT: Negative.   Eyes: Negative.   Respiratory: Negative.   Cardiovascular: Negative.   Gastrointestinal: Negative.   Musculoskeletal: Positive for joint swelling and arthralgias.  Skin: Negative.   Neurological: Negative.   Hematological: Negative.   Psychiatric/Behavioral: Negative.   All other systems reviewed and are negative.    BP 120/78  Pulse  64  Resp 16  Ht 5\' 7"  (1.702 m)  Wt 178 lb (80.74 kg)  BMI 27.88 kg/m2  Physical Exam  Nursing note and vitals reviewed. Constitutional: He is oriented to person, place, and time. He appears well-developed and well-nourished.  HENT:  Head: Normocephalic.  Nose: Nose normal.  Mouth/Throat: Oropharynx is clear and moist.  Eyes: Conjunctivae are normal. Pupils are equal, round, and reactive to light.  Neck: Normal range of motion. Neck supple. No JVD present.  Cardiovascular: Normal rate, S1 normal, S2 normal and intact distal pulses.  An irregularly irregular rhythm present. Exam reveals no gallop and no friction rub.   Murmur heard.  Crescendo systolic murmur is present with a grade of 1/6  Pulmonary/Chest: Effort normal and breath sounds normal. No respiratory distress. He has no  wheezes. He has no rales. He exhibits no tenderness.  Abdominal: Soft. Bowel sounds are normal. He exhibits no distension. There is no tenderness.  Musculoskeletal: Normal range of motion. He exhibits no edema and no tenderness.  Lymphadenopathy:    He has no cervical adenopathy.  Neurological: He is alert and oriented to person, place, and time. Coordination normal.  Skin: Skin is warm and dry. No rash noted. No erythema.  Psychiatric: He has a normal mood and affect. His behavior is normal. Judgment and thought content normal.           Assessment and Plan

## 2012-02-22 NOTE — Assessment & Plan Note (Signed)
We had a long discussion about his cholesterol. Most recent cholesterol was 210. He does have a strong family history of stroke. He is very concerned about stroke (moderate concern about heart attack as "everyone survives"). He will restart Zocor 20 mg daily as this is what he was on previously and he tolerated it well with no myalgias. He'll continue to watch his diet and work on his weight. He would like to lose 10 pounds.

## 2012-02-22 NOTE — Assessment & Plan Note (Signed)
Blood pressure is well controlled on today's visit. No changes made to the medications. 

## 2012-02-22 NOTE — Patient Instructions (Addendum)
You are doing well. Please start simvastatin one a day  Please call us if you have new issues that need to be addressed before your next appt.  Your physician wants you to follow-up in: 12 months.  You will receive a reminder letter in the mail two months in advance. If you don't receive a letter, please call our office to schedule the follow-up appointment.

## 2012-02-22 NOTE — Assessment & Plan Note (Signed)
Heart rate is well-controlled. He is on warfarin. No further medication changes made

## 2012-03-04 ENCOUNTER — Other Ambulatory Visit: Payer: Self-pay | Admitting: Internal Medicine

## 2012-03-20 ENCOUNTER — Other Ambulatory Visit: Payer: Self-pay | Admitting: *Deleted

## 2012-03-20 MED ORDER — HYDROCHLOROTHIAZIDE 25 MG PO TABS: 25.0000 mg | ORAL_TABLET | Freq: Every day | ORAL | Status: DC

## 2012-03-23 ENCOUNTER — Other Ambulatory Visit: Payer: Self-pay | Admitting: Internal Medicine

## 2012-03-28 ENCOUNTER — Ambulatory Visit (INDEPENDENT_AMBULATORY_CARE_PROVIDER_SITE_OTHER): Payer: Medicare Other | Admitting: Internal Medicine

## 2012-03-28 DIAGNOSIS — Z7901 Long term (current) use of anticoagulants: Secondary | ICD-10-CM

## 2012-03-28 DIAGNOSIS — I4891 Unspecified atrial fibrillation: Secondary | ICD-10-CM

## 2012-03-28 DIAGNOSIS — Z5181 Encounter for therapeutic drug level monitoring: Secondary | ICD-10-CM

## 2012-03-28 LAB — POCT INR: INR: 4.8

## 2012-03-28 NOTE — Patient Instructions (Signed)
Hold x 2 days then recheck Fri

## 2012-03-30 ENCOUNTER — Ambulatory Visit (INDEPENDENT_AMBULATORY_CARE_PROVIDER_SITE_OTHER): Payer: Medicare Other | Admitting: Internal Medicine

## 2012-03-30 DIAGNOSIS — I4891 Unspecified atrial fibrillation: Secondary | ICD-10-CM

## 2012-03-30 DIAGNOSIS — Z5181 Encounter for therapeutic drug level monitoring: Secondary | ICD-10-CM

## 2012-03-30 DIAGNOSIS — Z7901 Long term (current) use of anticoagulants: Secondary | ICD-10-CM

## 2012-03-30 LAB — POCT INR: INR: 2.4

## 2012-03-30 NOTE — Patient Instructions (Signed)
5 mg daily, check in 10 days ( reduced by 5 mg weekly)

## 2012-04-03 ENCOUNTER — Other Ambulatory Visit: Payer: Self-pay | Admitting: *Deleted

## 2012-04-03 NOTE — Telephone Encounter (Signed)
Faxed refill request   

## 2012-04-04 MED ORDER — ALPRAZOLAM 1 MG PO TABS: 1.0000 mg | ORAL_TABLET | Freq: Two times a day (BID) | ORAL | Status: DC | PRN

## 2012-04-04 NOTE — Telephone Encounter (Signed)
rx called into pharmacy

## 2012-04-04 NOTE — Telephone Encounter (Signed)
Okay #60 x 1 

## 2012-04-09 ENCOUNTER — Ambulatory Visit (INDEPENDENT_AMBULATORY_CARE_PROVIDER_SITE_OTHER): Payer: Medicare Other | Admitting: Internal Medicine

## 2012-04-09 DIAGNOSIS — I4891 Unspecified atrial fibrillation: Secondary | ICD-10-CM

## 2012-04-09 DIAGNOSIS — Z5181 Encounter for therapeutic drug level monitoring: Secondary | ICD-10-CM

## 2012-04-09 DIAGNOSIS — Z7901 Long term (current) use of anticoagulants: Secondary | ICD-10-CM

## 2012-04-09 LAB — POCT INR: INR: 1.7

## 2012-04-09 NOTE — Patient Instructions (Signed)
5 mg daily,  7.5 mg every Tues, increased 2.5mg  weekly,check 4 weeks

## 2012-05-07 ENCOUNTER — Encounter: Payer: Self-pay | Admitting: Internal Medicine

## 2012-05-07 ENCOUNTER — Ambulatory Visit (INDEPENDENT_AMBULATORY_CARE_PROVIDER_SITE_OTHER): Payer: Medicare Other | Admitting: Internal Medicine

## 2012-05-07 VITALS — BP 120/68 | HR 79 | Temp 97.8°F | Ht 67.0 in | Wt 172.0 lb

## 2012-05-07 DIAGNOSIS — I10 Essential (primary) hypertension: Secondary | ICD-10-CM

## 2012-05-07 DIAGNOSIS — Z7901 Long term (current) use of anticoagulants: Secondary | ICD-10-CM

## 2012-05-07 DIAGNOSIS — M5030 Other cervical disc degeneration, unspecified cervical region: Secondary | ICD-10-CM

## 2012-05-07 DIAGNOSIS — Z5181 Encounter for therapeutic drug level monitoring: Secondary | ICD-10-CM

## 2012-05-07 DIAGNOSIS — E785 Hyperlipidemia, unspecified: Secondary | ICD-10-CM

## 2012-05-07 DIAGNOSIS — R7301 Impaired fasting glucose: Secondary | ICD-10-CM

## 2012-05-07 DIAGNOSIS — I4891 Unspecified atrial fibrillation: Secondary | ICD-10-CM

## 2012-05-07 DIAGNOSIS — N400 Enlarged prostate without lower urinary tract symptoms: Secondary | ICD-10-CM

## 2012-05-07 LAB — CBC WITH DIFFERENTIAL/PLATELET
Basophils Absolute: 0 10*3/uL (ref 0.0–0.1)
Basophils Relative: 0.4 % (ref 0.0–3.0)
Eosinophils Absolute: 0.4 10*3/uL (ref 0.0–0.7)
Eosinophils Relative: 5.9 % — ABNORMAL HIGH (ref 0.0–5.0)
HCT: 44.1 % (ref 39.0–52.0)
Hemoglobin: 14.7 g/dL (ref 13.0–17.0)
Lymphocytes Relative: 24.9 % (ref 12.0–46.0)
Lymphs Abs: 1.5 10*3/uL (ref 0.7–4.0)
MCHC: 33.4 g/dL (ref 30.0–36.0)
MCV: 92.2 fl (ref 78.0–100.0)
Monocytes Absolute: 0.4 10*3/uL (ref 0.1–1.0)
Monocytes Relative: 7.2 % (ref 3.0–12.0)
Neutro Abs: 3.8 10*3/uL (ref 1.4–7.7)
Neutrophils Relative %: 61.6 % (ref 43.0–77.0)
Platelets: 221 10*3/uL (ref 150.0–400.0)
RBC: 4.78 Mil/uL (ref 4.22–5.81)
RDW: 13.2 % (ref 11.5–14.6)
WBC: 6.2 10*3/uL (ref 4.5–10.5)

## 2012-05-07 LAB — BASIC METABOLIC PANEL
BUN: 16 mg/dL (ref 6–23)
CO2: 28 mEq/L (ref 19–32)
Calcium: 9.3 mg/dL (ref 8.4–10.5)
Chloride: 103 mEq/L (ref 96–112)
Creatinine, Ser: 0.8 mg/dL (ref 0.4–1.5)
GFR: 103.01 mL/min (ref >60.00)
Glucose, Bld: 97 mg/dL (ref 70–99)
Potassium: 4.6 mEq/L (ref 3.5–5.1)
Sodium: 139 mEq/L (ref 135–145)

## 2012-05-07 LAB — HEMOGLOBIN A1C: Hgb A1c MFr Bld: 5.7 % (ref 4.6–6.5)

## 2012-05-07 LAB — TSH: TSH: 1.44 u[IU]/mL (ref 0.35–5.50)

## 2012-05-07 LAB — POCT INR: INR: 2.7

## 2012-05-07 MED ORDER — ATORVASTATIN CALCIUM 10 MG PO TABS: 10.0000 mg | ORAL_TABLET | Freq: Every day | ORAL | Status: DC

## 2012-05-07 NOTE — Progress Notes (Signed)
Subjective:    Patient ID: Mark Padilla, male    DOB: 1935-11-20, 77 y.o.   MRN: [id number]  HPI Having trouble with tinnitus---"driving me crazy" Some headaches with this Saw Dr Dominga Ferry Also saw allergist but testing was negative Started nasal lavage with saline--gets occ blood Still on singulair and flonase Left ear pops and that ear has somewhat decreased hearing---has feeling of fluid in there  Tried zocor at Dr Windell Hummingbird suggestion---stopped it due to fatigue and listlessness Feels better since stopping again  Able to work in the garden--- has a large one No chest pain  Does get some weakness or dizziness if he overdoes it No heart issues No palpitations  Knows he has enlarged prostate Discussed PSA---I seriously recommended that he never get this again  Current Outpatient Prescriptions on File Prior to Visit  Medication Sig Dispense Refill  . ALPRAZolam (XANAX) 1 MG tablet Take 1 tablet (1 mg total) by mouth 2 (two) times daily as needed.  60 tablet  1  . Ascorbic Acid (VITAMIN C) 500 MG tablet Take 500 mg by mouth daily.        Marland Kitchen atenolol (TENORMIN) 25 MG tablet TAKE 1 TABLET BY MOUTH EVERY DAY  30 tablet  6  . azelastine (ASTELIN) 137 MCG/SPRAY nasal spray Place 2 sprays into the nose 2 (two) times daily.       Marland Kitchen gabapentin (NEURONTIN) 300 MG capsule Take 300 mg by mouth 3 (three) times daily.        . hydrochlorothiazide (HYDRODIURIL) 25 MG tablet Take 1 tablet (25 mg total) by mouth daily.  90 tablet  3  . HYDROcodone-acetaminophen (VICODIN) 5-500 MG per tablet Take 1 tablet by mouth 4 (four) times daily as needed.  90 tablet  0  . loratadine (CLARITIN) 10 MG tablet Take 20 mg by mouth daily.        . meloxicam (MOBIC) 7.5 MG tablet TAKE 1 TABLET (7.5 MG TOTAL) BY MOUTH DAILY.  30 tablet  5  . montelukast (SINGULAIR) 10 MG tablet Take 1 tablet (10 mg total) by mouth at bedtime.  30 tablet  11  . Multiple Vitamin (MULTIVITAMIN) capsule Take 1 capsule  by mouth daily.        Marland Kitchen omeprazole (PRILOSEC) 40 MG capsule Take 40 mg by mouth daily.      . Potassium 75 MG TABS Take by mouth daily.        Marland Kitchen warfarin (COUMADIN) 5 MG tablet TAKE 1 TABLET BY MOUTH EVERY OTHER DAY ALTERNATING WITH 1 & 1/2 TABLETS EVERY OTHER DAY  45 tablet  8    Allergies  Allergen Reactions  . Simvastatin     Listlessness, fatigue  . Oxycodone-Acetaminophen Nausea And Vomiting  . Flexeril (Cyclobenzaprine Hcl) Anxiety  . Skelaxin Anxiety    Past Medical History  Diagnosis Date  . Allergy   . Hyperlipidemia   . Hypertension   . Diverticulitis   . Atrial fibrillation     chronic persistent, failed DCCV x3 in 2000  . ED (erectile dysfunction)   . Sleep disturbance   . Osteoarthritis   . Lumbar disc disease   . Cervical disc disease   . Impaired fasting glucose   . GERD (gastroesophageal reflux disease)     Past Surgical History  Procedure Date  . Shoulder surgery 1950's    dislocation  . Knee arthroscopy 06/1998    right Gavin Potters)  . Hemiarthroplasty shoulder fracture 10/2004    left  .  Shoulder surgery 12/2004    right  . Thr--left 4/12    Dr Gavin Potters  . Joint replacement 4/12    Left hip--Dr Gavin Potters  . Replacement unicondylar joint knee 8/12    Dr Gavin Potters  . Colonoscopy 2013    Family History  Problem Relation Age of Onset  . Stomach cancer Mother   . Stroke Father   . Colon polyps Brother   . Colon cancer Neg Hx     History   Social History  . Marital Status: Married    Spouse Name: N/A    Number of Children: 2  . Years of Education: N/A   Occupational History  . retired, [employer] works part time [employer]    Social History Main Topics  . Smoking status: Former Smoker    Types: Cigarettes    Quit date: 11/21/1962  . Smokeless tobacco: Never Used  . Alcohol Use: Yes     occasional  . Drug Use: No  . Sexually Active: Not on file   Other Topics Concern  . Not on file   Social History Narrative  . No  narrative on file   Review of Systems Still on gabapentin for discogenic pain in neck---feels it helps Sleeps okay---has stopped the [place]. Had a hard time getting off but is doing better again. Still some trouble initiating---xanax helps that. Stable nocturia    Objective:   Physical Exam  Constitutional: He appears well-developed and well-nourished. No distress.  Neck: Normal range of motion. Neck supple. No thyromegaly present.  Cardiovascular: Normal rate, regular rhythm, normal heart sounds and intact distal pulses.  Exam reveals no gallop.   No murmur heard. Pulmonary/Chest: Effort normal and breath sounds normal. No respiratory distress. He has no wheezes. He has no rales.  Abdominal: Soft. There is no tenderness.  Musculoskeletal: He exhibits no edema and no tenderness.  Lymphadenopathy:    He has no cervical adenopathy.  Psychiatric: He has a normal mood and affect. His behavior is normal.          Assessment & Plan:

## 2012-05-07 NOTE — Assessment & Plan Note (Signed)
Good rate control On coumadin 

## 2012-05-07 NOTE — Assessment & Plan Note (Signed)
BP Readings from Last 3 Encounters:  05/07/12 120/68  02/22/12 120/78  01/27/12 125/64   Good control Due for labs

## 2012-05-07 NOTE — Assessment & Plan Note (Signed)
Didn't tolerate zocor though he had done okay on this in past Will try low dose atorvastatin

## 2012-05-07 NOTE — Assessment & Plan Note (Signed)
Controlled with pain with the gabapentin Will continue

## 2012-05-07 NOTE — Patient Instructions (Signed)
Please set up lipid and hepatic blood work (272.) in 2 months with protime

## 2012-05-07 NOTE — Assessment & Plan Note (Signed)
Mild symptoms Discussed that he should not get PSA testing anymore

## 2012-05-07 NOTE — Patient Instructions (Addendum)
5 mg daily,  7.5 mg every Tues, recheck 4 weeks

## 2012-05-08 ENCOUNTER — Encounter: Payer: Self-pay | Admitting: *Deleted

## 2012-05-14 ENCOUNTER — Other Ambulatory Visit: Payer: Self-pay | Admitting: *Deleted

## 2012-05-14 MED ORDER — WARFARIN SODIUM 5 MG PO TABS: ORAL_TABLET | ORAL | Status: DC

## 2012-05-15 ENCOUNTER — Other Ambulatory Visit: Payer: Self-pay | Admitting: Internal Medicine

## 2012-05-17 ENCOUNTER — Other Ambulatory Visit: Payer: Self-pay | Admitting: *Deleted

## 2012-05-18 MED ORDER — HYDROCODONE-ACETAMINOPHEN 5-500 MG PO TABS: 1.0000 | ORAL_TABLET | Freq: Four times a day (QID) | ORAL | Status: DC | PRN

## 2012-05-18 NOTE — Telephone Encounter (Signed)
rx called into pharmacy

## 2012-05-18 NOTE — Telephone Encounter (Signed)
Okay #90 x 0 

## 2012-05-21 ENCOUNTER — Other Ambulatory Visit: Payer: Self-pay | Admitting: *Deleted

## 2012-05-21 MED ORDER — FLUTICASONE PROPIONATE 50 MCG/ACT NA SUSP: 2.0000 | Freq: Every day | NASAL | Status: DC

## 2012-06-04 ENCOUNTER — Encounter: Payer: Self-pay | Admitting: Otolaryngology

## 2012-06-18 ENCOUNTER — Ambulatory Visit (INDEPENDENT_AMBULATORY_CARE_PROVIDER_SITE_OTHER): Payer: Medicare Other | Admitting: Internal Medicine

## 2012-06-18 DIAGNOSIS — Z7901 Long term (current) use of anticoagulants: Secondary | ICD-10-CM

## 2012-06-18 DIAGNOSIS — I4891 Unspecified atrial fibrillation: Secondary | ICD-10-CM

## 2012-06-18 DIAGNOSIS — E785 Hyperlipidemia, unspecified: Secondary | ICD-10-CM

## 2012-06-18 DIAGNOSIS — Z5181 Encounter for therapeutic drug level monitoring: Secondary | ICD-10-CM

## 2012-06-18 LAB — HEPATIC FUNCTION PANEL
ALT: 25 U/L (ref 0–53)
AST: 33 U/L (ref 0–37)
Albumin: 4 g/dL (ref 3.5–5.2)
Alkaline Phosphatase: 59 U/L (ref 39–117)
Bilirubin, Direct: 0.1 mg/dL (ref 0.0–0.3)
Total Bilirubin: 0.8 mg/dL (ref 0.3–1.2)
Total Protein: 6.5 g/dL (ref 6.0–8.3)

## 2012-06-18 LAB — LIPID PANEL
Cholesterol: 127 mg/dL (ref 0–200)
HDL: 49.2 mg/dL (ref >39.00)
LDL Cholesterol: 66 mg/dL (ref 0–99)
Total CHOL/HDL Ratio: 3
Triglycerides: 61 mg/dL (ref 0.0–149.0)
VLDL: 12.2 mg/dL (ref 0.0–40.0)

## 2012-06-18 LAB — POCT INR: INR: 2.6

## 2012-06-18 NOTE — Patient Instructions (Signed)
Continue current dose, check in 4 weeks  

## 2012-07-11 ENCOUNTER — Other Ambulatory Visit: Payer: Self-pay | Admitting: *Deleted

## 2012-07-11 MED ORDER — ALPRAZOLAM 1 MG PO TABS: 1.0000 mg | ORAL_TABLET | Freq: Two times a day (BID) | ORAL | Status: DC | PRN

## 2012-07-11 NOTE — Telephone Encounter (Signed)
Last refilled 05/17/2012

## 2012-07-11 NOTE — Telephone Encounter (Signed)
Okay #60 x 0 

## 2012-07-11 NOTE — Telephone Encounter (Signed)
rx called into pharmacy

## 2012-07-12 ENCOUNTER — Other Ambulatory Visit: Payer: Self-pay | Admitting: *Deleted

## 2012-07-12 MED ORDER — ALPRAZOLAM 1 MG PO TABS: 1.0000 mg | ORAL_TABLET | Freq: Two times a day (BID) | ORAL | Status: DC | PRN

## 2012-07-12 NOTE — Telephone Encounter (Signed)
rx called into pharmacy

## 2012-07-12 NOTE — Telephone Encounter (Signed)
Last filled 05/17/2012

## 2012-07-12 NOTE — Telephone Encounter (Signed)
Okay #60 x 0 

## 2012-07-30 ENCOUNTER — Ambulatory Visit (INDEPENDENT_AMBULATORY_CARE_PROVIDER_SITE_OTHER): Payer: Medicare Other | Admitting: Family Medicine

## 2012-07-30 DIAGNOSIS — Z7901 Long term (current) use of anticoagulants: Secondary | ICD-10-CM

## 2012-07-30 DIAGNOSIS — Z5181 Encounter for therapeutic drug level monitoring: Secondary | ICD-10-CM

## 2012-07-30 DIAGNOSIS — I4891 Unspecified atrial fibrillation: Secondary | ICD-10-CM

## 2012-07-30 LAB — POCT INR: INR: 3.4

## 2012-07-30 NOTE — Patient Instructions (Addendum)
Decrease to 5 mg daily except 7.5 mg every other tues , recheck 4 weeks (pt going out of town will be gone 1 month and does not want to have checked there)

## 2012-08-14 ENCOUNTER — Ambulatory Visit (INDEPENDENT_AMBULATORY_CARE_PROVIDER_SITE_OTHER): Payer: Medicare Other | Admitting: Internal Medicine

## 2012-08-14 DIAGNOSIS — Z5181 Encounter for therapeutic drug level monitoring: Secondary | ICD-10-CM

## 2012-08-14 DIAGNOSIS — I4891 Unspecified atrial fibrillation: Secondary | ICD-10-CM

## 2012-08-14 DIAGNOSIS — Z7901 Long term (current) use of anticoagulants: Secondary | ICD-10-CM

## 2012-08-14 LAB — POCT INR: INR: 5.6

## 2012-08-14 NOTE — Patient Instructions (Signed)
Hold coumadin x next 3 doses, recheck Fri

## 2012-08-17 ENCOUNTER — Ambulatory Visit (INDEPENDENT_AMBULATORY_CARE_PROVIDER_SITE_OTHER): Payer: Medicare Other | Admitting: Internal Medicine

## 2012-08-17 DIAGNOSIS — Z7901 Long term (current) use of anticoagulants: Secondary | ICD-10-CM

## 2012-08-17 DIAGNOSIS — I4891 Unspecified atrial fibrillation: Secondary | ICD-10-CM

## 2012-08-17 DIAGNOSIS — Z5181 Encounter for therapeutic drug level monitoring: Secondary | ICD-10-CM

## 2012-08-17 LAB — POCT INR: INR: 1.9

## 2012-08-17 NOTE — Patient Instructions (Signed)
5 mg daily , check in 4 days before going out of town for a month

## 2012-08-21 ENCOUNTER — Ambulatory Visit (INDEPENDENT_AMBULATORY_CARE_PROVIDER_SITE_OTHER): Payer: Medicare Other | Admitting: Internal Medicine

## 2012-08-21 DIAGNOSIS — I4891 Unspecified atrial fibrillation: Secondary | ICD-10-CM

## 2012-08-21 DIAGNOSIS — Z5181 Encounter for therapeutic drug level monitoring: Secondary | ICD-10-CM

## 2012-08-21 DIAGNOSIS — Z7901 Long term (current) use of anticoagulants: Secondary | ICD-10-CM

## 2012-08-21 LAB — POCT INR: INR: 2

## 2012-08-21 NOTE — Patient Instructions (Signed)
5 mg daily , 2 weeks

## 2012-08-25 ENCOUNTER — Other Ambulatory Visit: Payer: Self-pay | Admitting: Internal Medicine

## 2012-08-28 ENCOUNTER — Ambulatory Visit: Payer: Medicare Other

## 2012-08-29 ENCOUNTER — Other Ambulatory Visit: Payer: Self-pay | Admitting: *Deleted

## 2012-08-29 MED ORDER — HYDROCODONE-ACETAMINOPHEN 5-500 MG PO TABS: 1.0000 | ORAL_TABLET | Freq: Four times a day (QID) | ORAL | Status: DC | PRN

## 2012-08-29 MED ORDER — ALPRAZOLAM 1 MG PO TABS: 1.0000 mg | ORAL_TABLET | Freq: Two times a day (BID) | ORAL | Status: DC | PRN

## 2012-08-29 NOTE — Telephone Encounter (Signed)
Okay alprazolam #60 x 0 Hydrocodone #90 x 0 

## 2012-08-29 NOTE — Telephone Encounter (Signed)
rx called into pharmacy

## 2012-09-04 ENCOUNTER — Ambulatory Visit: Payer: Medicare Other

## 2012-09-05 ENCOUNTER — Ambulatory Visit (INDEPENDENT_AMBULATORY_CARE_PROVIDER_SITE_OTHER): Payer: Medicare Other | Admitting: Internal Medicine

## 2012-09-05 ENCOUNTER — Encounter: Payer: Self-pay | Admitting: Internal Medicine

## 2012-09-05 VITALS — BP 118/62 | HR 58 | Temp 98.0°F | Wt 178.0 lb

## 2012-09-05 DIAGNOSIS — I4891 Unspecified atrial fibrillation: Secondary | ICD-10-CM

## 2012-09-05 DIAGNOSIS — E785 Hyperlipidemia, unspecified: Secondary | ICD-10-CM

## 2012-09-05 DIAGNOSIS — Z7901 Long term (current) use of anticoagulants: Secondary | ICD-10-CM

## 2012-09-05 DIAGNOSIS — Z5181 Encounter for therapeutic drug level monitoring: Secondary | ICD-10-CM

## 2012-09-05 DIAGNOSIS — J309 Allergic rhinitis, unspecified: Secondary | ICD-10-CM

## 2012-09-05 LAB — POCT INR: INR: 2.5

## 2012-09-05 NOTE — Assessment & Plan Note (Signed)
Discussed that for his allergies, what he notes is all that is important Okay to try off the astelin to see if he can do without it----since that is the most expensive

## 2012-09-05 NOTE — Patient Instructions (Signed)
Continue current dose, check in 4 weeks  

## 2012-09-05 NOTE — Patient Instructions (Signed)
Please try off the astelin for now. If your allergies are not worse, stay off it. You can restart it in the spring if your allergies worsen

## 2012-09-05 NOTE — Progress Notes (Signed)
Subjective:    Patient ID: Mark Padilla, male    DOB: [birth year], 77 y.o.   MRN: [id number]  HPI Feels good "for a 77 year old man"  Questions about atorvastatin No physical problems with this Worried about the med Reassured about liver issues Discussed myositis----he stays very active and only notes appropriate muscle aching (like after spending time on boat, fishing or mowing lawn, etc) No cognitive changes of concern Very prudent with eating Weight has been stable  Wonders also about the singulair Has mild drainage Some wheezing sound at night but no SOB Has been taking it regularly since I started it Does feel that his allergies are better overall on current regimen astelin is biggest problem due to copay  Current Outpatient Prescriptions on File Prior to Visit  Medication Sig Dispense Refill  . ALPRAZolam (XANAX) 1 MG tablet Take 1 tablet (1 mg total) by mouth 2 (two) times daily as needed.  60 tablet  0  . Ascorbic Acid (VITAMIN C) 500 MG tablet Take 500 mg by mouth daily.        Marland Kitchen atenolol (TENORMIN) 25 MG tablet TAKE 1 TABLET BY MOUTH EVERY DAY  30 tablet  6  . atorvastatin (LIPITOR) 10 MG tablet Take 1 tablet (10 mg total) by mouth daily.  30 tablet  11  . azelastine (ASTELIN) 137 MCG/SPRAY nasal spray Place 2 sprays into the nose 2 (two) times daily.       . fluticasone (FLONASE) 50 MCG/ACT nasal spray Place 2 sprays into the nose daily.  16 g  11  . gabapentin (NEURONTIN) 300 MG capsule Take 300 mg by mouth 3 (three) times daily.        . hydrochlorothiazide (HYDRODIURIL) 25 MG tablet Take 1 tablet (25 mg total) by mouth daily.  90 tablet  3  . HYDROcodone-acetaminophen (VICODIN) 5-500 MG per tablet Take 1 tablet by mouth 4 (four) times daily as needed.  90 tablet  0  . loratadine (CLARITIN) 10 MG tablet Take 20 mg by mouth daily.        . meloxicam (MOBIC) 7.5 MG tablet TAKE 1 TABLET (7.5 MG TOTAL) BY MOUTH DAILY.  30 tablet  5  . montelukast (SINGULAIR) 10 MG  tablet Take 1 tablet (10 mg total) by mouth at bedtime.  30 tablet  11  . Multiple Vitamin (MULTIVITAMIN) capsule Take 1 capsule by mouth daily.        Marland Kitchen omeprazole (PRILOSEC) 40 MG capsule Take 40 mg by mouth daily.      . Potassium 75 MG TABS Take by mouth daily.        Marland Kitchen warfarin (COUMADIN) 5 MG tablet TAKE 1 TABLET BY MOUTH EVERY OTHER DAY ALTERNATING WITH 1 & 1/2 TABLETS EVERY OTHER DAY  45 tablet  7    Allergies  Allergen Reactions  . Simvastatin     Listlessness, fatigue  . Oxycodone-Acetaminophen Nausea And Vomiting  . Flexeril (Cyclobenzaprine Hcl) Anxiety  . Skelaxin Anxiety    Past Medical History  Diagnosis Date  . Hyperlipidemia   . Hypertension   . Diverticulitis   . Atrial fibrillation     chronic persistent, failed DCCV x3 in 2000  . ED (erectile dysfunction)   . Sleep disturbance   . Osteoarthritis   . Lumbar disc disease   . Cervical disc disease   . Impaired fasting glucose   . GERD (gastroesophageal reflux disease)   . BPH (benign prostatic hypertrophy)   . Allergic rhinitis  Past Surgical History  Procedure Date  . Shoulder surgery 1950's    dislocation  . Knee arthroscopy 06/1998    right Gavin Potters)  . Hemiarthroplasty shoulder fracture 10/2004    left  . Shoulder surgery 12/2004    right  . Thr--left 4/12    Dr Gavin Potters  . Joint replacement 4/12    Left hip--Dr Gavin Potters  . Replacement unicondylar joint knee 8/12    Dr Gavin Potters  . Colonoscopy 2013    Family History  Problem Relation Age of Onset  . Stomach cancer Mother   . Stroke Father   . Colon polyps Brother   . Colon cancer Neg Hx     History   Social History  . Marital Status: Married    Spouse Name: N/A    Number of Children: 2  . Years of Education: N/A   Occupational History  . retired, [employer] works part time [employer]    Social History Main Topics  . Smoking status: Former Smoker    Types: Cigarettes    Quit date: 11/21/1962  . Smokeless tobacco:  Never Used  . Alcohol Use: Yes     occasional  . Drug Use: No  . Sexually Active: Not on file   Other Topics Concern  . Not on file   Social History Narrative  . No narrative on file   Review of Systems Weight stable Appetite is fine    Objective:   Physical Exam  Constitutional: He appears well-developed and well-nourished. No distress.  Psychiatric: He has a normal mood and affect. His behavior is normal.          Assessment & Plan:

## 2012-09-05 NOTE — Assessment & Plan Note (Signed)
Discussed the uncertainties of benefit of primary prevention Discussed that he is doing well on the medicine Can review this with Dr Mariah Milling

## 2012-10-01 ENCOUNTER — Other Ambulatory Visit: Payer: Self-pay | Admitting: Internal Medicine

## 2012-10-22 ENCOUNTER — Ambulatory Visit: Payer: Medicare Other

## 2012-10-23 ENCOUNTER — Other Ambulatory Visit: Payer: Self-pay | Admitting: *Deleted

## 2012-10-23 ENCOUNTER — Ambulatory Visit (INDEPENDENT_AMBULATORY_CARE_PROVIDER_SITE_OTHER): Payer: Medicare Other | Admitting: General Practice

## 2012-10-23 DIAGNOSIS — I4891 Unspecified atrial fibrillation: Secondary | ICD-10-CM

## 2012-10-23 LAB — POCT INR: INR: 2.6

## 2012-10-23 MED ORDER — ALPRAZOLAM 1 MG PO TABS: 1.0000 mg | ORAL_TABLET | Freq: Two times a day (BID) | ORAL | Status: DC | PRN

## 2012-10-23 NOTE — Telephone Encounter (Signed)
Okay #60 x 0 

## 2012-10-23 NOTE — Telephone Encounter (Signed)
rx called into pharmacy

## 2012-11-21 HISTORY — PX: CATARACT EXTRACTION, BILATERAL: SHX1313

## 2012-11-30 ENCOUNTER — Other Ambulatory Visit: Payer: Self-pay | Admitting: *Deleted

## 2012-11-30 MED ORDER — HYDROCODONE-ACETAMINOPHEN 5-500 MG PO TABS: 1.0000 | ORAL_TABLET | Freq: Four times a day (QID) | ORAL | Status: DC | PRN

## 2012-11-30 NOTE — Telephone Encounter (Signed)
rx called into pharmacy

## 2012-11-30 NOTE — Telephone Encounter (Signed)
Okay #90 x 0 

## 2012-12-04 ENCOUNTER — Ambulatory Visit (INDEPENDENT_AMBULATORY_CARE_PROVIDER_SITE_OTHER): Payer: Medicare Other | Admitting: Internal Medicine

## 2012-12-04 ENCOUNTER — Encounter: Payer: Self-pay | Admitting: Internal Medicine

## 2012-12-04 VITALS — BP 140/80 | HR 81 | Temp 97.9°F | Ht 67.0 in | Wt 179.0 lb

## 2012-12-04 DIAGNOSIS — M159 Polyosteoarthritis, unspecified: Secondary | ICD-10-CM

## 2012-12-04 DIAGNOSIS — E785 Hyperlipidemia, unspecified: Secondary | ICD-10-CM

## 2012-12-04 DIAGNOSIS — L570 Actinic keratosis: Secondary | ICD-10-CM | POA: Insufficient documentation

## 2012-12-04 DIAGNOSIS — I4891 Unspecified atrial fibrillation: Secondary | ICD-10-CM

## 2012-12-04 DIAGNOSIS — Z0000 Encounter for general adult medical examination without abnormal findings: Secondary | ICD-10-CM

## 2012-12-04 DIAGNOSIS — I10 Essential (primary) hypertension: Secondary | ICD-10-CM

## 2012-12-04 LAB — CBC WITH DIFFERENTIAL/PLATELET
Basophils Absolute: 0 10*3/uL (ref 0.0–0.1)
Basophils Relative: 0.7 % (ref 0.0–3.0)
Eosinophils Absolute: 0.3 10*3/uL (ref 0.0–0.7)
Eosinophils Relative: 5.7 % — ABNORMAL HIGH (ref 0.0–5.0)
HCT: 44.6 % (ref 39.0–52.0)
Hemoglobin: 15.1 g/dL (ref 13.0–17.0)
Lymphocytes Relative: 26.3 % (ref 12.0–46.0)
Lymphs Abs: 1.5 10*3/uL (ref 0.7–4.0)
MCHC: 33.8 g/dL (ref 30.0–36.0)
MCV: 91.5 fl (ref 78.0–100.0)
Monocytes Absolute: 0.5 10*3/uL (ref 0.1–1.0)
Monocytes Relative: 9.3 % (ref 3.0–12.0)
Neutro Abs: 3.3 10*3/uL (ref 1.4–7.7)
Neutrophils Relative %: 58 % (ref 43.0–77.0)
Platelets: 216 10*3/uL (ref 150.0–400.0)
RBC: 4.88 Mil/uL (ref 4.22–5.81)
RDW: 12.8 % (ref 11.5–14.6)
WBC: 5.7 10*3/uL (ref 4.5–10.5)

## 2012-12-04 LAB — BASIC METABOLIC PANEL
BUN: 15 mg/dL (ref 6–23)
CO2: 30 mEq/L (ref 19–32)
Calcium: 9.5 mg/dL (ref 8.4–10.5)
Chloride: 100 mEq/L (ref 96–112)
Creatinine, Ser: 0.9 mg/dL (ref 0.4–1.5)
GFR: 85.01 mL/min (ref >60.00)
Glucose, Bld: 108 mg/dL — ABNORMAL HIGH (ref 70–99)
Potassium: 3.7 mEq/L (ref 3.5–5.1)
Sodium: 137 mEq/L (ref 135–145)

## 2012-12-04 LAB — LIPID PANEL
Cholesterol: 148 mg/dL (ref 0–200)
HDL: 49.3 mg/dL (ref >39.00)
LDL Cholesterol: 86 mg/dL (ref 0–99)
Total CHOL/HDL Ratio: 3
Triglycerides: 66 mg/dL (ref 0.0–149.0)
VLDL: 13.2 mg/dL (ref 0.0–40.0)

## 2012-12-04 LAB — HEPATIC FUNCTION PANEL
ALT: 27 U/L (ref 0–53)
AST: 41 U/L — ABNORMAL HIGH (ref 0–37)
Albumin: 4.5 g/dL (ref 3.5–5.2)
Alkaline Phosphatase: 61 U/L (ref 39–117)
Bilirubin, Direct: 0.1 mg/dL (ref 0.0–0.3)
Total Bilirubin: 0.9 mg/dL (ref 0.3–1.2)
Total Protein: 7.6 g/dL (ref 6.0–8.3)

## 2012-12-04 LAB — PROTIME-INR
INR: 3.7 ratio — ABNORMAL HIGH (ref 0.8–1.0)
Prothrombin Time: 38.3 s — ABNORMAL HIGH (ref 10.2–12.4)

## 2012-12-04 LAB — TSH: TSH: 1.5 u[IU]/mL (ref 0.35–5.50)

## 2012-12-04 NOTE — Assessment & Plan Note (Signed)
Generally healthy Stays active Sciatic symptoms from hauling wood--discussed using good judgement No PSA due to age

## 2012-12-04 NOTE — Addendum Note (Signed)
Addended by: Baldomero Lamy on: 12/04/2012 10:43 AM   Modules accepted: Orders

## 2012-12-04 NOTE — Assessment & Plan Note (Signed)
BP Readings from Last 3 Encounters:  12/04/12 140/80  09/05/12 118/62  05/07/12 120/68   Reasonable control No changes

## 2012-12-04 NOTE — Assessment & Plan Note (Signed)
Good rate control On coumadin 

## 2012-12-04 NOTE — Progress Notes (Signed)
Subjective:    Patient ID: Mark Padilla, male    DOB: [birth year], 78 y.o.   MRN: [id number]  HPI Here for physical  Wound up going back on astelin---thinks it helps keep him clearer at night Notes some itching and sweating---feels it started after montelukast and atorvastatin started Discussed trials without these  Having some right hip and leg pain Pain runs along lateral right leg to ankle Knows he has L5 disc degeneration---hadn't been bothering him With the cold weather, he hauled wood up steps for fire Then the pain came on Feels some weakness in knee and ankle Has needed the vicodin for this--esp first thing in the morning  Continues to walk 30 minutes daily Uses the mall in the bad weather  Current Outpatient Prescriptions on File Prior to Visit  Medication Sig Dispense Refill  . ALPRAZolam (XANAX) 1 MG tablet Take 1 tablet (1 mg total) by mouth 2 (two) times daily as needed.  60 tablet  0  . Ascorbic Acid (VITAMIN C) 500 MG tablet Take 500 mg by mouth daily.        Marland Kitchen atenolol (TENORMIN) 25 MG tablet TAKE 1 TABLET BY MOUTH EVERY DAY  30 tablet  11  . atorvastatin (LIPITOR) 10 MG tablet Take 1 tablet (10 mg total) by mouth daily.  30 tablet  11  . azelastine (ASTELIN) 137 MCG/SPRAY nasal spray Place 2 sprays into the nose at bedtime.       . fluticasone (FLONASE) 50 MCG/ACT nasal spray Place 2 sprays into the nose daily.  16 g  11  . gabapentin (NEURONTIN) 300 MG capsule Take 300 mg by mouth 3 (three) times daily.        . hydrochlorothiazide (HYDRODIURIL) 25 MG tablet Take 1 tablet (25 mg total) by mouth daily.  90 tablet  3  . HYDROcodone-acetaminophen (VICODIN) 5-500 MG per tablet Take 1 tablet by mouth 4 (four) times daily as needed.  90 tablet  0  . loratadine (CLARITIN) 10 MG tablet Take 20 mg by mouth daily.        . meloxicam (MOBIC) 7.5 MG tablet TAKE 1 TABLET (7.5 MG TOTAL) BY MOUTH DAILY.  30 tablet  5  . montelukast (SINGULAIR) 10 MG tablet TAKE 1 TABLET (10 MG  TOTAL) BY MOUTH AT BEDTIME.  30 tablet  11  . Multiple Vitamin (MULTIVITAMIN) capsule Take 1 capsule by mouth daily.        Marland Kitchen omeprazole (PRILOSEC) 40 MG capsule Take 40 mg by mouth daily.      . Potassium 75 MG TABS Take by mouth daily.        Marland Kitchen warfarin (COUMADIN) 5 MG tablet TAKE 1 TABLET BY MOUTH EVERY OTHER DAY ALTERNATING WITH 1 & 1/2 TABLETS EVERY OTHER DAY  45 tablet  7    Allergies  Allergen Reactions  . Simvastatin     Listlessness, fatigue  . Oxycodone-Acetaminophen Nausea And Vomiting  . Flexeril (Cyclobenzaprine Hcl) Anxiety  . Skelaxin Anxiety    Past Medical History  Diagnosis Date  . Hyperlipidemia   . Hypertension   . Diverticulitis   . Atrial fibrillation     chronic persistent, failed DCCV x3 in 2000  . ED (erectile dysfunction)   . Sleep disturbance   . Osteoarthritis   . Lumbar disc disease   . Cervical disc disease   . Impaired fasting glucose   . GERD (gastroesophageal reflux disease)   . BPH (benign prostatic hypertrophy)   . Allergic rhinitis  Past Surgical History  Procedure Date  . Shoulder surgery 1950's    dislocation  . Knee arthroscopy 06/1998    right Gavin Potters)  . Hemiarthroplasty shoulder fracture 10/2004    left  . Shoulder surgery 12/2004    right  . Thr--left 4/12    Dr Gavin Potters  . Joint replacement 4/12    Left hip--Dr Gavin Potters  . Replacement unicondylar joint knee 8/12    Dr Gavin Potters  . Colonoscopy 2013    Family History  Problem Relation Age of Onset  . Stomach cancer Mother   . Stroke Father   . Colon polyps Brother   . Colon cancer Neg Hx     History   Social History  . Marital Status: Divorced    Spouse Name: N/A    Number of Children: 2  . Years of Education: N/A   Occupational History  . retired, [employer] works part time [employer]    Social History Main Topics  . Smoking status: Former Smoker    Types: Cigarettes    Quit date: 11/21/1962  . Smokeless tobacco: Never Used  . Alcohol  Use: Yes     Comment: occasional  . Drug Use: No  . Sexually Active: Not on file   Other Topics Concern  . Not on file   Social History Narrative   Plans to do living willWill designate daughter Agustin Cree as health care POA. Lives with herWould accept resuscitation but no prolonged artificial ventilationWould not want tube feeds if cognitively unaware   Review of Systems  Constitutional: Negative for fatigue and unexpected weight change.       Wears seat belt  HENT: Positive for hearing loss, congestion, rhinorrhea and tinnitus. Negative for dental problem.        Tinnitus is better now Full dentures---doesn't see dentist  Eyes: Positive for visual disturbance.       No diplopia or unilateral vision loss Has "starburst" when exposed to the sun or headlights (relates to cataracts)  Respiratory: Negative for cough, chest tightness and shortness of breath.   Cardiovascular: Negative for chest pain, palpitations and leg swelling.  Gastrointestinal: Positive for diarrhea. Negative for nausea, vomiting, abdominal pain and blood in stool.       Heartburn controlled on the omeprazole Occ gets 1 day of diarrhea  Genitourinary: Negative for urgency, frequency and difficulty urinating.       Nocturia x 1 at most No sig daytime problems  Musculoskeletal: Positive for back pain and arthralgias. Negative for joint swelling.  Skin: Negative for rash.       Has lesion on right shoulder or posterior neck he wants checked  Neurological: Positive for weakness and headaches. Negative for dizziness, syncope and light-headedness.       Only occ headaches--relates to allergies  Hematological: Negative for adenopathy. Bruises/bleeds easily.  Psychiatric/Behavioral: Negative for sleep disturbance and dysphoric mood. The patient is not nervous/anxious.        Mood is better now that he is living with his daughter--didn't like living alone       Objective:   Physical Exam  Constitutional: He is oriented  to person, place, and time. He appears well-developed and well-nourished. No distress.  HENT:  Head: Normocephalic and atraumatic.  Right Ear: External ear normal.  Left Ear: External ear normal.  Mouth/Throat: Oropharynx is clear and moist.  Eyes: Conjunctivae normal and EOM are normal. Pupils are equal, round, and reactive to light.  Neck: Normal range of motion. Neck supple. No  thyromegaly present.  Cardiovascular: Normal rate, normal heart sounds and intact distal pulses.  Exam reveals no gallop.   No murmur heard.      irregular  Pulmonary/Chest: Effort normal and breath sounds normal. No respiratory distress. He has no wheezes. He has no rales.  Abdominal: Soft. There is no tenderness.  Musculoskeletal: He exhibits no edema and no tenderness.       Very limited internal rotation of right hip  Lymphadenopathy:    He has no cervical adenopathy.  Neurological: He is alert and oriented to person, place, and time.  Skin: No rash noted. No erythema.       Actinic keratosis on upper right back  Psychiatric: He has a normal mood and affect. His behavior is normal.          Assessment & Plan:

## 2012-12-04 NOTE — Patient Instructions (Signed)
Please try without the montelukast to see if it is still needed and if it is causing the itching. If no improvement in the itching, you can try off the atorvastatin to see if that is the problem.

## 2012-12-04 NOTE — Assessment & Plan Note (Signed)
Liquid nitrogen 45 seconds x 2 Tolerated well Discussed home care 

## 2012-12-04 NOTE — Assessment & Plan Note (Signed)
Now with sciatic pain also Uses the hydrocodone prn

## 2012-12-04 NOTE — Assessment & Plan Note (Signed)
He still has his reservations about the atorvastatin Will try off it if the itching isn't better off the montelukast

## 2012-12-05 ENCOUNTER — Encounter: Payer: Self-pay | Admitting: *Deleted

## 2012-12-06 ENCOUNTER — Ambulatory Visit (INDEPENDENT_AMBULATORY_CARE_PROVIDER_SITE_OTHER): Payer: Medicare Other | Admitting: General Practice

## 2012-12-06 DIAGNOSIS — Z7901 Long term (current) use of anticoagulants: Secondary | ICD-10-CM

## 2012-12-06 DIAGNOSIS — I4891 Unspecified atrial fibrillation: Secondary | ICD-10-CM

## 2012-12-06 DIAGNOSIS — Z5181 Encounter for therapeutic drug level monitoring: Secondary | ICD-10-CM

## 2012-12-06 NOTE — Patient Instructions (Signed)
Skip dose of coumadin today and then continue to take 5 mg tablet every day and re-check in 3 weeks.

## 2012-12-14 ENCOUNTER — Telehealth: Payer: Self-pay | Admitting: Internal Medicine

## 2012-12-14 NOTE — Telephone Encounter (Signed)
Caller: Mark Padilla/Patient; Phone: [phone]; Reason for Call: Patient was seen by Dr.  Daryel Gerald and he has started him on Prednisone for his back.  He has asked the patient to call Dr.  Alphonsus Sias and ask if he can safely stop the Coumadin x 5 days.  Current Coumadin dose is 5mg  qd.    If the Prednisone work, patient will need an epidural.   Please let patient know decision please.  [phone].

## 2012-12-14 NOTE — Telephone Encounter (Signed)
It would be okay for him to stop the coumadin for 5 days before an epidural shot should that be needed

## 2012-12-14 NOTE — Telephone Encounter (Signed)
Left message on VM with results, advised pt to return call to let us know he got the message

## 2013-01-03 ENCOUNTER — Ambulatory Visit: Payer: Medicare Other

## 2013-01-08 ENCOUNTER — Ambulatory Visit: Payer: Self-pay | Admitting: Physical Medicine and Rehabilitation

## 2013-01-10 ENCOUNTER — Ambulatory Visit (INDEPENDENT_AMBULATORY_CARE_PROVIDER_SITE_OTHER): Payer: Medicare Other | Admitting: General Practice

## 2013-01-10 DIAGNOSIS — I4891 Unspecified atrial fibrillation: Secondary | ICD-10-CM

## 2013-01-10 LAB — POCT INR: INR: 1.9

## 2013-01-10 NOTE — Patient Instructions (Signed)
Take 7.5 mg of coumadin today and then continue to take 5 mg all days.  Re-check in 4 weeks.

## 2013-01-23 ENCOUNTER — Other Ambulatory Visit: Payer: Self-pay | Admitting: *Deleted

## 2013-01-25 NOTE — Telephone Encounter (Signed)
Okay alprazolam #60 x 0 Hydrocodone #90 x 0

## 2013-01-26 NOTE — Telephone Encounter (Signed)
Attempted to call in refills, pharmacy without power.

## 2013-01-28 MED ORDER — HYDROCODONE-ACETAMINOPHEN 5-325 MG PO TABS: 1.0000 | ORAL_TABLET | Freq: Four times a day (QID) | ORAL | Status: DC | PRN

## 2013-01-28 MED ORDER — ALPRAZOLAM 1 MG PO TABS: 1.0000 mg | ORAL_TABLET | Freq: Two times a day (BID) | ORAL | Status: DC | PRN

## 2013-01-28 NOTE — Telephone Encounter (Signed)
rx called into pharmacy

## 2013-01-29 ENCOUNTER — Telehealth: Payer: Self-pay

## 2013-01-29 NOTE — Telephone Encounter (Signed)
Pt request permission from PCP to get epidural with Dr Ricardo Jericho at [facility]. Pt needs to know when to stop coumadin prior to procedure. Pt request note faxed to Dr Ricardo Jericho at [facility].

## 2013-01-29 NOTE — Telephone Encounter (Signed)
Stop coumadin 3 days prior to procedure then restart day after procedure. Have INR check day after restart coumadin.

## 2013-01-30 NOTE — Telephone Encounter (Signed)
Spoke with patient and advised results   

## 2013-02-04 NOTE — Telephone Encounter (Signed)
Please call Dr Margaretann Loveless office It is okay to hold the coumadin for 5 days prior to his procedure--then restart right after Set up protime about 1 week after restarting to be sure he is therapeutic again

## 2013-02-04 NOTE — Telephone Encounter (Signed)
Sue Lush with Dr Wille Celeste clinic left v/m requesting pt to hold coumadin 5 days prior to injection date. Saw previous note pt to hold coumadin 3 days prior.Please advise.

## 2013-02-06 NOTE — Telephone Encounter (Signed)
Left message for Mark Padilla to return my call.  Spoke with patient and advised results

## 2013-02-06 NOTE — Telephone Encounter (Signed)
Spoke with Dr.Chasnis office and advised results.

## 2013-02-07 ENCOUNTER — Ambulatory Visit (INDEPENDENT_AMBULATORY_CARE_PROVIDER_SITE_OTHER): Payer: Medicare Other | Admitting: General Practice

## 2013-02-07 DIAGNOSIS — I4891 Unspecified atrial fibrillation: Secondary | ICD-10-CM

## 2013-02-07 DIAGNOSIS — Z5181 Encounter for therapeutic drug level monitoring: Secondary | ICD-10-CM

## 2013-02-07 DIAGNOSIS — Z7901 Long term (current) use of anticoagulants: Secondary | ICD-10-CM

## 2013-02-07 LAB — POCT INR: INR: 2.8

## 2013-02-07 NOTE — Patient Instructions (Addendum)
3/20 - Take last dose of coumadin 3/26 - Procedure (take nothing) 3/27 take 7.5 mg of coumadin 3/28 take 7.5 mg of coumadin 3/29 take 5 mg of coumadin 3/30 take 5 mg of coumadin 3/31 take 5 mg of coumadin 4/1  Take 5 mg of coumadin 4/2  Take 5 mg of coumadin 4/3  Re-check in coumadin clinic

## 2013-02-21 ENCOUNTER — Ambulatory Visit (INDEPENDENT_AMBULATORY_CARE_PROVIDER_SITE_OTHER): Payer: Medicare Other | Admitting: General Practice

## 2013-02-21 DIAGNOSIS — I4891 Unspecified atrial fibrillation: Secondary | ICD-10-CM

## 2013-02-21 DIAGNOSIS — Z7901 Long term (current) use of anticoagulants: Secondary | ICD-10-CM

## 2013-02-21 DIAGNOSIS — Z5181 Encounter for therapeutic drug level monitoring: Secondary | ICD-10-CM

## 2013-02-21 LAB — POCT INR: INR: 2.2

## 2013-02-21 NOTE — Patient Instructions (Signed)
Continue to take 1 tablet every day.

## 2013-03-12 ENCOUNTER — Telehealth: Payer: Self-pay | Admitting: General Practice

## 2013-03-12 ENCOUNTER — Telehealth: Payer: Self-pay

## 2013-03-12 NOTE — Telephone Encounter (Signed)
Incoming call from [facility] @ Dr. Deirdre Evener office.  Patient will not need to hold coumadin for cataract surgery.

## 2013-03-12 NOTE — Telephone Encounter (Signed)
Pt left v/m; having cataract surgery on 03/22/13 with Dr Elmer Picker. Call Dr [facility] office [phone] with info when pt should stop Coumadin prior to cataract surgery. Pt request call back also.

## 2013-03-13 NOTE — Telephone Encounter (Signed)
Great - thanks

## 2013-03-13 NOTE — Telephone Encounter (Signed)
The decision about how long the coumadin should be held is up to the surgeon. Since he is on it just for atrial fibrillation, it is okay for him to stop it for the customary 5 days (though some surgeons want a protime before they operate to make sure it is down to a certain level)  Please set up appropriate follow up after restarting as well

## 2013-03-26 ENCOUNTER — Other Ambulatory Visit: Payer: Self-pay | Admitting: *Deleted

## 2013-03-26 NOTE — Telephone Encounter (Signed)
Last filled 01/28/13

## 2013-03-27 MED ORDER — ALPRAZOLAM 1 MG PO TABS: 1.0000 mg | ORAL_TABLET | Freq: Two times a day (BID) | ORAL | Status: DC | PRN

## 2013-03-27 NOTE — Telephone Encounter (Signed)
Okay #60 x 0 

## 2013-03-27 NOTE — Telephone Encounter (Signed)
rx called into pharmacy

## 2013-03-28 ENCOUNTER — Other Ambulatory Visit: Payer: Self-pay | Admitting: Internal Medicine

## 2013-04-04 ENCOUNTER — Ambulatory Visit (INDEPENDENT_AMBULATORY_CARE_PROVIDER_SITE_OTHER): Payer: Medicare Other | Admitting: General Practice

## 2013-04-04 DIAGNOSIS — Z5181 Encounter for therapeutic drug level monitoring: Secondary | ICD-10-CM

## 2013-04-04 DIAGNOSIS — Z7901 Long term (current) use of anticoagulants: Secondary | ICD-10-CM

## 2013-04-04 DIAGNOSIS — I4891 Unspecified atrial fibrillation: Secondary | ICD-10-CM

## 2013-04-04 LAB — POCT INR: INR: 1.9

## 2013-04-04 NOTE — Patient Instructions (Signed)
Take extra 1/2 tablet today and then continue to take 1 tablet every day. Re-check in 6 weeks.

## 2013-04-05 ENCOUNTER — Other Ambulatory Visit: Payer: Self-pay | Admitting: Internal Medicine

## 2013-04-07 ENCOUNTER — Other Ambulatory Visit: Payer: Self-pay | Admitting: Internal Medicine

## 2013-04-19 ENCOUNTER — Telehealth: Payer: Self-pay | Admitting: General Practice

## 2013-04-19 NOTE — Telephone Encounter (Signed)
LMOM for patient to call coumadin clinic about re-starting coumadin post epidural procedure.

## 2013-04-20 ENCOUNTER — Other Ambulatory Visit: Payer: Self-pay | Admitting: Internal Medicine

## 2013-05-02 ENCOUNTER — Ambulatory Visit (INDEPENDENT_AMBULATORY_CARE_PROVIDER_SITE_OTHER): Payer: Medicare Other | Admitting: Family Medicine

## 2013-05-02 DIAGNOSIS — Z5181 Encounter for therapeutic drug level monitoring: Secondary | ICD-10-CM

## 2013-05-02 DIAGNOSIS — Z7901 Long term (current) use of anticoagulants: Secondary | ICD-10-CM

## 2013-05-02 DIAGNOSIS — I4891 Unspecified atrial fibrillation: Secondary | ICD-10-CM

## 2013-05-02 LAB — POCT INR: INR: 2.6

## 2013-05-02 NOTE — Patient Instructions (Addendum)
Continue to take 1 tablet everyday. Re check in 6 weeks.  

## 2013-05-15 DIAGNOSIS — M549 Dorsalgia, unspecified: Secondary | ICD-10-CM | POA: Insufficient documentation

## 2013-05-16 ENCOUNTER — Ambulatory Visit: Payer: Medicare Other

## 2013-05-16 ENCOUNTER — Other Ambulatory Visit: Payer: Self-pay | Admitting: Internal Medicine

## 2013-05-16 MED ORDER — ALPRAZOLAM 1 MG PO TABS: 1.0000 mg | ORAL_TABLET | Freq: Two times a day (BID) | ORAL | Status: DC | PRN

## 2013-05-16 NOTE — Telephone Encounter (Signed)
Fax refill request.

## 2013-05-16 NOTE — Telephone Encounter (Signed)
Okay #60 x 0 

## 2013-05-16 NOTE — Telephone Encounter (Signed)
rx called into pharmacy

## 2013-05-27 ENCOUNTER — Encounter: Payer: Self-pay | Admitting: Physical Medicine and Rehabilitation

## 2013-06-03 ENCOUNTER — Other Ambulatory Visit: Payer: Self-pay | Admitting: Internal Medicine

## 2013-06-04 ENCOUNTER — Telehealth: Payer: Self-pay | Admitting: Family Medicine

## 2013-06-04 ENCOUNTER — Other Ambulatory Visit: Payer: Self-pay | Admitting: *Deleted

## 2013-06-04 ENCOUNTER — Telehealth: Payer: Self-pay | Admitting: General Practice

## 2013-06-04 MED ORDER — MELOXICAM 7.5 MG PO TABS: ORAL_TABLET | ORAL | Status: DC

## 2013-06-04 MED ORDER — MONTELUKAST SODIUM 10 MG PO TABS: ORAL_TABLET | ORAL | Status: DC

## 2013-06-04 MED ORDER — ATENOLOL 25 MG PO TABS: ORAL_TABLET | ORAL | Status: DC

## 2013-06-04 MED ORDER — ATORVASTATIN CALCIUM 10 MG PO TABS: ORAL_TABLET | ORAL | Status: DC

## 2013-06-04 MED ORDER — FLUTICASONE PROPIONATE 50 MCG/ACT NA SUSP: NASAL | Status: DC

## 2013-06-04 NOTE — Telephone Encounter (Signed)
Arline Asp, RN from [employer] that [place], [place] from [place] called regarding pt's upcoming surgery.  Attempted to call her at [phone] and I was unable to leave message.  Will try again tomorrow.

## 2013-06-04 NOTE — Telephone Encounter (Signed)
Spoke with patient and instructed to take last dose of coumadin before surgery on 7/16.  Instructed patient to resume coumadin on 7/22 and to take 7.5 mg X 2 days and then resume 5 mg every day.  Re-check in coumadin clinic on Thursday 7/31.  Patient verbalized understanding.

## 2013-06-07 ENCOUNTER — Other Ambulatory Visit: Payer: Self-pay | Admitting: Family Medicine

## 2013-06-07 MED ORDER — WARFARIN SODIUM 5 MG PO TABS: ORAL_TABLET | ORAL | Status: DC

## 2013-06-10 HISTORY — PX: LUMBAR LAMINECTOMY: SHX95

## 2013-06-13 ENCOUNTER — Ambulatory Visit: Payer: Medicare Other

## 2013-06-19 ENCOUNTER — Encounter: Payer: Self-pay | Admitting: Radiology

## 2013-06-20 ENCOUNTER — Ambulatory Visit (INDEPENDENT_AMBULATORY_CARE_PROVIDER_SITE_OTHER): Payer: Medicare Other | Admitting: Internal Medicine

## 2013-06-20 ENCOUNTER — Encounter: Payer: Self-pay | Admitting: Internal Medicine

## 2013-06-20 ENCOUNTER — Ambulatory Visit: Payer: Medicare Other

## 2013-06-20 ENCOUNTER — Ambulatory Visit (INDEPENDENT_AMBULATORY_CARE_PROVIDER_SITE_OTHER): Payer: Medicare Other | Admitting: Family Medicine

## 2013-06-20 VITALS — BP 128/70 | HR 86 | Temp 98.4°F | Wt 175.0 lb

## 2013-06-20 DIAGNOSIS — I4891 Unspecified atrial fibrillation: Secondary | ICD-10-CM

## 2013-06-20 DIAGNOSIS — M159 Polyosteoarthritis, unspecified: Secondary | ICD-10-CM

## 2013-06-20 DIAGNOSIS — G479 Sleep disorder, unspecified: Secondary | ICD-10-CM

## 2013-06-20 DIAGNOSIS — Z7901 Long term (current) use of anticoagulants: Secondary | ICD-10-CM

## 2013-06-20 DIAGNOSIS — Z5181 Encounter for therapeutic drug level monitoring: Secondary | ICD-10-CM

## 2013-06-20 DIAGNOSIS — I10 Essential (primary) hypertension: Secondary | ICD-10-CM

## 2013-06-20 DIAGNOSIS — N400 Enlarged prostate without lower urinary tract symptoms: Secondary | ICD-10-CM

## 2013-06-20 LAB — POCT INR: INR: 1.6

## 2013-06-20 NOTE — Assessment & Plan Note (Signed)
BP Readings from Last 3 Encounters:  06/20/13 128/70  12/04/12 140/80  09/05/12 118/62   Good control

## 2013-06-20 NOTE — Assessment & Plan Note (Signed)
Urinary retention since the surgery On flomax Going to urologist at [facility]

## 2013-06-20 NOTE — Patient Instructions (Addendum)
Take an extra 1/2 tablet to day and then continue to take 1 tablet every day. Re-check in 4 weeks.

## 2013-06-20 NOTE — Assessment & Plan Note (Signed)
Most of his pain from the lumbar back  Better now after laminectomy

## 2013-06-20 NOTE — Progress Notes (Signed)
Subjective:    Patient ID: Mark Padilla, male    DOB: [birth year], 78 y.o.   MRN: [id number]  HPI Had lumbar laminectomy at [facility] 2 weeks ago Epidural steroid shots really hadn't helped So far he feels the pain is better Unable to void since the surgery Started on flomax and still has a catheter in Has appt with urology at [facility] the back stitches removed  No heart problems No palpitations  No SOB No edema No dizziness or sycnope  Sleeps okay with xanax Rarely uses it during the day  Current Outpatient Prescriptions on File Prior to Visit  Medication Sig Dispense Refill  . ALPRAZolam (XANAX) 1 MG tablet Take 1 tablet (1 mg total) by mouth 2 (two) times daily as needed.  60 tablet  0  . Ascorbic Acid (VITAMIN C) 500 MG tablet Take 500 mg by mouth daily.        Marland Kitchen atenolol (TENORMIN) 25 MG tablet TAKE 1 TABLET BY MOUTH EVERY DAY  90 tablet  2  . atorvastatin (LIPITOR) 10 MG tablet TAKE 1 TABLET BY MOUTH DAILY.  90 tablet  2  . azelastine (ASTELIN) 137 MCG/SPRAY nasal spray Place 2 sprays into the nose at bedtime.       . fluticasone (FLONASE) 50 MCG/ACT nasal spray USE 2 SPRAYS INTO THE NOSE DAILY  48 g  2  . gabapentin (NEURONTIN) 300 MG capsule Take 300 mg by mouth 3 (three) times daily.        . hydrochlorothiazide (HYDRODIURIL) 25 MG tablet TAKE 1 TABLET BY MOUTH DAILY  90 tablet  3  . HYDROcodone-acetaminophen (NORCO/VICODIN) 5-325 MG per tablet Take 1 tablet by mouth 4 (four) times daily as needed.  90 tablet  0  . loratadine (CLARITIN) 10 MG tablet Take 20 mg by mouth daily.        . meloxicam (MOBIC) 7.5 MG tablet TAKE 1 TABLET BY MOUTH DAILY  90 tablet  0  . montelukast (SINGULAIR) 10 MG tablet TAKE 1 TABLET (10 MG TOTAL) BY MOUTH AT BEDTIME.  90 tablet  2  . Multiple Vitamin (MULTIVITAMIN) capsule Take 1 capsule by mouth daily.        Marland Kitchen omeprazole (PRILOSEC) 40 MG capsule Take 40 mg by mouth daily.      . Potassium 75 MG TABS Take by mouth daily.        Marland Kitchen warfarin  (COUMADIN) 5 MG tablet Take as directed by Coumadin Clinic  105 tablet  0   No current facility-administered medications on file prior to visit.    Allergies  Allergen Reactions  . Simvastatin     Listlessness, fatigue  . Oxycodone-Acetaminophen Nausea And Vomiting  . Flexeril (Cyclobenzaprine Hcl) Anxiety  . Skelaxin Anxiety    Past Medical History  Diagnosis Date  . Hyperlipidemia   . Hypertension   . Diverticulitis   . Atrial fibrillation     chronic persistent, failed DCCV x3 in 2000  . ED (erectile dysfunction)   . Sleep disturbance   . Osteoarthritis   . Lumbar disc disease   . Cervical disc disease   . Impaired fasting glucose   . GERD (gastroesophageal reflux disease)   . BPH (benign prostatic hypertrophy)   . Allergic rhinitis     Past Surgical History  Procedure Laterality Date  . Shoulder surgery  1950's    dislocation  . Knee arthroscopy  06/1998    right Gavin Potters)  . Hemiarthroplasty shoulder fracture  10/2004  left  . Shoulder surgery  12/2004    right  . Thr--left  4/12    Dr Gavin Potters  . Joint replacement  4/12    Left hip--Dr Gavin Potters  . Replacement unicondylar joint knee  8/12    Dr Gavin Potters  . Colonoscopy  2013  . Lumbar laminectomy  06/10/13    [facility]  . Cataract extraction, bilateral  2014    Family History  Problem Relation Age of Onset  . Stomach cancer Mother   . Stroke Father   . Colon polyps Brother   . Colon cancer Neg Hx     History   Social History  . Marital Status: Divorced    Spouse Name: N/A    Number of Children: 2  . Years of Education: N/A   Occupational History  . retired, [employer] works part time [employer]    Social History Main Topics  . Smoking status: Former Smoker    Types: Cigarettes    Quit date: 11/21/1962  . Smokeless tobacco: Never Used  . Alcohol Use: Yes     Comment: occasional  . Drug Use: No  . Sexually Active: Not on file   Other Topics Concern  . Not on file   Social  History Narrative   Plans to do living will   Will designate daughter Agustin Cree as health care POA. Lives with her   Would accept resuscitation but no prolonged artificial ventilation   Would not want tube feeds if cognitively unaware   Review of Systems Appetite is okay Weight stable    Objective:   Physical Exam  Constitutional: He appears well-developed and well-nourished. No distress.  Neck: Normal range of motion. Neck supple. No thyromegaly present.  Cardiovascular: Normal rate and normal heart sounds.  Exam reveals no gallop.   No murmur heard. irregular  Pulmonary/Chest: Effort normal and breath sounds normal. No respiratory distress. He has no wheezes. He has no rales.  Genitourinary:  Foley draining to leg bag  Musculoskeletal: He exhibits no edema.  Lymphadenopathy:    He has no cervical adenopathy.  Skin:  Long lumbar incision is clean and dry 1 long running stitch removed without difficulty--steristrips applied  Psychiatric: He has a normal mood and affect. His behavior is normal.          Assessment & Plan:

## 2013-06-20 NOTE — Assessment & Plan Note (Signed)
Good rate control Still on coumadin

## 2013-06-20 NOTE — Assessment & Plan Note (Signed)
Does well with the alprazolam

## 2013-07-15 ENCOUNTER — Other Ambulatory Visit: Payer: Self-pay | Admitting: *Deleted

## 2013-07-15 MED ORDER — ALPRAZOLAM 1 MG PO TABS: 1.0000 mg | ORAL_TABLET | Freq: Two times a day (BID) | ORAL | Status: DC | PRN

## 2013-07-15 NOTE — Telephone Encounter (Signed)
Okay #60 x 0 

## 2013-07-15 NOTE — Telephone Encounter (Signed)
rx called into pharmacy

## 2013-07-18 ENCOUNTER — Ambulatory Visit (INDEPENDENT_AMBULATORY_CARE_PROVIDER_SITE_OTHER): Payer: Medicare Other | Admitting: Family Medicine

## 2013-07-18 DIAGNOSIS — I4891 Unspecified atrial fibrillation: Secondary | ICD-10-CM

## 2013-07-18 DIAGNOSIS — Z5181 Encounter for therapeutic drug level monitoring: Secondary | ICD-10-CM

## 2013-07-18 DIAGNOSIS — Z7901 Long term (current) use of anticoagulants: Secondary | ICD-10-CM

## 2013-07-18 LAB — POCT INR: INR: 3.5

## 2013-07-18 NOTE — Patient Instructions (Signed)
Skip Coumadin tomorrow (8/29) and then continue to take 1 tablet every day. Re-check in 6 weeks per pt request.

## 2013-08-05 ENCOUNTER — Ambulatory Visit (INDEPENDENT_AMBULATORY_CARE_PROVIDER_SITE_OTHER): Payer: Medicare Other | Admitting: Internal Medicine

## 2013-08-05 ENCOUNTER — Encounter: Payer: Self-pay | Admitting: Internal Medicine

## 2013-08-05 VITALS — BP 138/80 | HR 81 | Temp 98.1°F | Wt 176.0 lb

## 2013-08-05 DIAGNOSIS — M5136 Other intervertebral disc degeneration, lumbar region: Secondary | ICD-10-CM

## 2013-08-05 DIAGNOSIS — M5137 Other intervertebral disc degeneration, lumbosacral region: Secondary | ICD-10-CM

## 2013-08-05 MED ORDER — HYDROCODONE-ACETAMINOPHEN 5-325 MG PO TABS: 1.0000 | ORAL_TABLET | Freq: Three times a day (TID) | ORAL | Status: DC | PRN

## 2013-08-05 NOTE — Assessment & Plan Note (Signed)
Has improved with the surgery but very slow Doesn't feel right Will switch him back to the hydrocodone instead of the hydromorphone  May need to consider changing the tamsulosin if he doesn't feel better

## 2013-08-05 NOTE — Progress Notes (Signed)
Subjective:    Patient ID: Mark Padilla, male    DOB: 1935-08-21, 78 y.o.   MRN: [id number]  HPI He has had slow progress from his back surgery Still aches a lot Has been liberalized with activity other than bending Does walk 2 miles regularly and exercises  "I still feel like crap" Wonders if it is the hydromorphone Dose decreased to 2mg  tid Feels nervous and shaky----even trouble reading the paper this morning Had to use a xanax  Awakens with sweat at times---but not hot  Had been on baclofen--he did stop this Voids okay on the tamsulosin  Now having some "raspy" feeling in chest Took an extra allergy med  Current Outpatient Prescriptions on File Prior to Visit  Medication Sig Dispense Refill  . ALPRAZolam (XANAX) 1 MG tablet Take 1 tablet (1 mg total) by mouth 2 (two) times daily as needed.  60 tablet  0  . Ascorbic Acid (VITAMIN C) 500 MG tablet Take 500 mg by mouth daily.        Marland Kitchen atenolol (TENORMIN) 25 MG tablet TAKE 1 TABLET BY MOUTH EVERY DAY  90 tablet  2  . atorvastatin (LIPITOR) 10 MG tablet TAKE 1 TABLET BY MOUTH DAILY.  90 tablet  2  . azelastine (ASTELIN) 137 MCG/SPRAY nasal spray Place 2 sprays into the nose at bedtime.       . fluticasone (FLONASE) 50 MCG/ACT nasal spray USE 2 SPRAYS INTO THE NOSE DAILY  48 g  2  . gabapentin (NEURONTIN) 300 MG capsule Take 300 mg by mouth 3 (three) times daily.        . hydrochlorothiazide (HYDRODIURIL) 25 MG tablet TAKE 1 TABLET BY MOUTH DAILY  90 tablet  3  . HYDROcodone-acetaminophen (NORCO/VICODIN) 5-325 MG per tablet Take 1 tablet by mouth 4 (four) times daily as needed.  90 tablet  0  . HYDROmorphone (DILAUDID) 2 MG tablet Take 2 mg by mouth 3 (three) times daily.       Marland Kitchen loratadine (CLARITIN) 10 MG tablet Take 20 mg by mouth daily.        . meloxicam (MOBIC) 7.5 MG tablet TAKE 1 TABLET BY MOUTH DAILY  90 tablet  0  . montelukast (SINGULAIR) 10 MG tablet TAKE 1 TABLET (10 MG TOTAL) BY MOUTH AT BEDTIME.  90 tablet  2  .  Multiple Vitamin (MULTIVITAMIN) capsule Take 1 capsule by mouth daily.        Marland Kitchen omeprazole (PRILOSEC) 40 MG capsule Take 40 mg by mouth daily.      . polyethylene glycol (MIRALAX / GLYCOLAX) packet Take 17 g by mouth 2 (two) times daily.       . Potassium 75 MG TABS Take by mouth daily.        . tamsulosin (FLOMAX) 0.4 MG CAPS Take 0.4 mg by mouth daily.       Marland Kitchen warfarin (COUMADIN) 5 MG tablet Take as directed by Coumadin Clinic  105 tablet  0   No current facility-administered medications on file prior to visit.    Allergies  Allergen Reactions  . Simvastatin     Listlessness, fatigue  . Oxycodone-Acetaminophen Nausea And Vomiting  . Flexeril [Cyclobenzaprine Hcl] Anxiety  . Skelaxin Anxiety    Past Medical History  Diagnosis Date  . Hyperlipidemia   . Hypertension   . Diverticulitis   . Atrial fibrillation     chronic persistent, failed DCCV x3 in 2000  . ED (erectile dysfunction)   . Sleep disturbance   .  Osteoarthritis   . Lumbar disc disease   . Cervical disc disease   . Impaired fasting glucose   . GERD (gastroesophageal reflux disease)   . BPH (benign prostatic hypertrophy)   . Allergic rhinitis     Past Surgical History  Procedure Laterality Date  . Shoulder surgery  1950's    dislocation  . Knee arthroscopy  06/1998    right Gavin Potters)  . Hemiarthroplasty shoulder fracture  10/2004    left  . Shoulder surgery  12/2004    right  . Thr--left  4/12    Dr Gavin Potters  . Joint replacement  4/12    Left hip--Dr Gavin Potters  . Replacement unicondylar joint knee  8/12    Dr Gavin Potters  . Colonoscopy  2013  . Lumbar laminectomy  06/10/13    [facility]  . Cataract extraction, bilateral  2014    Family History  Problem Relation Age of Onset  . Stomach cancer Mother   . Stroke Father   . Colon polyps Brother   . Colon cancer Neg Hx     History   Social History  . Marital Status: Divorced    Spouse Name: N/A    Number of Children: 2  . Years of Education: N/A    Occupational History  . retired, [employer] works part time [employer]    Social History Main Topics  . Smoking status: Former Smoker    Types: Cigarettes    Quit date: 11/21/1962  . Smokeless tobacco: Never Used  . Alcohol Use: Yes     Comment: occasional  . Drug Use: No  . Sexual Activity: Not on file   Other Topics Concern  . Not on file   Social History Narrative   Plans to do living will   Will designate daughter Agustin Cree as health care POA. Lives with her   Would accept resuscitation but no prolonged artificial ventilation   Would not want tube feeds if cognitively unaware   Review of Systems Using senna plus which is helping his bowels Sleeps okay--nocturia is rare Appetite is okay    Objective:   Physical Exam  Constitutional: He appears well-developed and well-nourished. No distress.  Neurological:  Fairly normal gait now No focal weakness  Psychiatric: He has a normal mood and affect. His behavior is normal.          Assessment & Plan:

## 2013-08-06 ENCOUNTER — Other Ambulatory Visit: Payer: Self-pay | Admitting: Internal Medicine

## 2013-08-22 ENCOUNTER — Telehealth: Payer: Self-pay | Admitting: *Deleted

## 2013-08-22 NOTE — Telephone Encounter (Signed)
We can replace all meds except the alprazolam and hydrocodone Because they are controlled, we can't send them again until he is due
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
## 2013-08-23 MED ORDER — OMEPRAZOLE 40 MG PO CPDR: 40.0000 mg | DELAYED_RELEASE_CAPSULE | Freq: Every day | ORAL | Status: DC

## 2013-08-23 MED ORDER — ALPRAZOLAM 1 MG PO TABS: 1.0000 mg | ORAL_TABLET | Freq: Two times a day (BID) | ORAL | Status: DC | PRN

## 2013-08-23 MED ORDER — MONTELUKAST SODIUM 10 MG PO TABS: ORAL_TABLET | ORAL | Status: DC

## 2013-08-23 MED ORDER — LORATADINE 10 MG PO TABS: 10.0000 mg | ORAL_TABLET | Freq: Every day | ORAL | Status: DC

## 2013-08-23 MED ORDER — ATORVASTATIN CALCIUM 10 MG PO TABS: ORAL_TABLET | ORAL | Status: DC

## 2013-08-23 MED ORDER — GABAPENTIN 300 MG PO CAPS: 300.0000 mg | ORAL_CAPSULE | Freq: Three times a day (TID) | ORAL | Status: DC

## 2013-08-23 NOTE — Telephone Encounter (Signed)
Bonita Quin T said pt calling to ck on status of alprazolam; pt picked up other meds sent to [facility].. Medication phoned to [place] at [facility] [address] [facility] as instructed.Pt notified by Bonita Quin T done.

## 2013-08-23 NOTE — Telephone Encounter (Signed)
Please see refill request for Prudy Feeler, he is due last refilled 07/15/13, ok to phone in?

## 2013-08-23 NOTE — Telephone Encounter (Signed)
Pt called back this morning, wanted to be seen by Dr. Alphonsus Sias, stated he got no sleep last night and is "so shaky he cannot read the paper this morning". Dr. Karle Starch schedule full,  Geraldine Contras said she would call meds into [facility].  Pt was not on phone line after checked with CMA.  Called left message to inform pt that meds were being called in and to call us if he would like to be seen today by another provider. / lt

## 2013-08-23 NOTE — Telephone Encounter (Signed)
Yes  Okay to send alprazolam #60 x 0

## 2013-09-02 ENCOUNTER — Ambulatory Visit: Payer: Medicare Other

## 2013-09-02 ENCOUNTER — Other Ambulatory Visit: Payer: Self-pay | Admitting: Internal Medicine

## 2013-09-06 ENCOUNTER — Ambulatory Visit: Payer: Medicare Other | Admitting: Internal Medicine

## 2013-09-09 ENCOUNTER — Ambulatory Visit (INDEPENDENT_AMBULATORY_CARE_PROVIDER_SITE_OTHER): Payer: Medicare Other | Admitting: Family Medicine

## 2013-09-09 ENCOUNTER — Other Ambulatory Visit: Payer: Self-pay | Admitting: *Deleted

## 2013-09-09 ENCOUNTER — Encounter: Payer: Self-pay | Admitting: Internal Medicine

## 2013-09-09 ENCOUNTER — Ambulatory Visit (INDEPENDENT_AMBULATORY_CARE_PROVIDER_SITE_OTHER): Payer: Medicare Other | Admitting: Internal Medicine

## 2013-09-09 VITALS — BP 120/60 | HR 83 | Temp 98.2°F | Wt 176.0 lb

## 2013-09-09 DIAGNOSIS — I4891 Unspecified atrial fibrillation: Secondary | ICD-10-CM

## 2013-09-09 DIAGNOSIS — Z23 Encounter for immunization: Secondary | ICD-10-CM

## 2013-09-09 DIAGNOSIS — Z5181 Encounter for therapeutic drug level monitoring: Secondary | ICD-10-CM

## 2013-09-09 DIAGNOSIS — N400 Enlarged prostate without lower urinary tract symptoms: Secondary | ICD-10-CM

## 2013-09-09 DIAGNOSIS — M159 Polyosteoarthritis, unspecified: Secondary | ICD-10-CM

## 2013-09-09 DIAGNOSIS — Z7901 Long term (current) use of anticoagulants: Secondary | ICD-10-CM

## 2013-09-09 DIAGNOSIS — L570 Actinic keratosis: Secondary | ICD-10-CM

## 2013-09-09 LAB — POCT INR: INR: 2.9

## 2013-09-09 MED ORDER — OXYCODONE-ACETAMINOPHEN 5-325 MG PO TABS: 1.0000 | ORAL_TABLET | Freq: Two times a day (BID) | ORAL | Status: DC | PRN

## 2013-09-09 NOTE — Patient Instructions (Signed)
Please stop the tamsulosin. If you have more trouble urinating, you can restart this.
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
## 2013-09-09 NOTE — Patient Instructions (Signed)
Continue to take 1 tablet every day. Re-check in 6 weeks per pt request.

## 2013-09-09 NOTE — Assessment & Plan Note (Signed)
Voiding okay off the tamsulosin If he has increased urinary problems, will need to restart

## 2013-09-09 NOTE — Assessment & Plan Note (Signed)
Chronic pain---left hip seems worse than the back lately Will change to percocet

## 2013-09-09 NOTE — Telephone Encounter (Signed)
Pt forgot to ask about this request at his OV today, ok to fill?

## 2013-09-09 NOTE — Assessment & Plan Note (Signed)
At end of visit, notes concerns about scaly, tender lesions along both preauricular areas They are actinics  PROCEDURE Liquid nitrogen 35 seconds x 2 to each Tolerated well Discussed home care

## 2013-09-09 NOTE — Addendum Note (Signed)
Addended by: Sueanne Margarita on: 09/09/2013 05:34 PM   Modules accepted: Orders

## 2013-09-10 MED ORDER — SILDENAFIL CITRATE 100 MG PO TABS: 100.0000 mg | ORAL_TABLET | Freq: Every day | ORAL | Status: DC | PRN

## 2013-09-10 NOTE — Telephone Encounter (Signed)
rx sent to pharmacy by e-script Spoke with patient and advised results   

## 2013-09-10 NOTE — Telephone Encounter (Signed)
Okay #10 x 5

## 2013-09-16 ENCOUNTER — Other Ambulatory Visit: Payer: Self-pay | Admitting: Internal Medicine

## 2013-09-28 ENCOUNTER — Other Ambulatory Visit: Payer: Self-pay | Admitting: Internal Medicine

## 2013-10-11 ENCOUNTER — Other Ambulatory Visit: Payer: Self-pay | Admitting: Internal Medicine

## 2013-10-11 NOTE — Telephone Encounter (Signed)
Okay #60 x 0 

## 2013-10-11 NOTE — Telephone Encounter (Signed)
Last filled 08/22/13 

## 2013-10-11 NOTE — Telephone Encounter (Signed)
rx called into pharmacy

## 2013-10-21 ENCOUNTER — Ambulatory Visit (INDEPENDENT_AMBULATORY_CARE_PROVIDER_SITE_OTHER): Payer: Medicare Other | Admitting: Family Medicine

## 2013-10-21 DIAGNOSIS — Z7901 Long term (current) use of anticoagulants: Secondary | ICD-10-CM

## 2013-10-21 DIAGNOSIS — Z5181 Encounter for therapeutic drug level monitoring: Secondary | ICD-10-CM

## 2013-10-21 DIAGNOSIS — I4891 Unspecified atrial fibrillation: Secondary | ICD-10-CM

## 2013-10-21 LAB — POCT INR: INR: 2

## 2013-10-21 NOTE — Patient Instructions (Signed)
Continue to take 1 tablet every day.  Augmentin does not have an interaction with Coumadin so you're ok to stay on normal dose.  Re-check in 6 weeks per pt request

## 2013-10-23 ENCOUNTER — Other Ambulatory Visit: Payer: Self-pay

## 2013-10-23 NOTE — Telephone Encounter (Signed)
Pt left note for oxycodone apap; spoke with pt and he does not need rx today. Call pt when rx ready for pick up.

## 2013-10-24 MED ORDER — OXYCODONE-ACETAMINOPHEN 5-325 MG PO TABS: 1.0000 | ORAL_TABLET | Freq: Two times a day (BID) | ORAL | Status: DC | PRN

## 2013-10-25 NOTE — Telephone Encounter (Signed)
Pt notified Rx ready for pickup 

## 2013-11-08 ENCOUNTER — Ambulatory Visit (INDEPENDENT_AMBULATORY_CARE_PROVIDER_SITE_OTHER): Payer: Medicare Other | Admitting: Cardiovascular Disease

## 2013-11-08 ENCOUNTER — Encounter: Payer: Self-pay | Admitting: Cardiovascular Disease

## 2013-11-08 VITALS — BP 128/84 | HR 55 | Ht 68.0 in | Wt 184.0 lb

## 2013-11-08 DIAGNOSIS — R079 Chest pain, unspecified: Secondary | ICD-10-CM

## 2013-11-08 DIAGNOSIS — E785 Hyperlipidemia, unspecified: Secondary | ICD-10-CM

## 2013-11-08 DIAGNOSIS — I4891 Unspecified atrial fibrillation: Secondary | ICD-10-CM

## 2013-11-08 DIAGNOSIS — I10 Essential (primary) hypertension: Secondary | ICD-10-CM

## 2013-11-08 NOTE — Assessment & Plan Note (Signed)
Cholesterol is at goal on the current lipid regimen. No changes to the medications were made.  

## 2013-11-08 NOTE — Assessment & Plan Note (Addendum)
Blood pressure is well controlled on today's visit. No changes made to the medications. 

## 2013-11-08 NOTE — Patient Instructions (Signed)
You are doing well. No medication changes were made.  Please call us if you have new issues that need to be addressed before your next appt.  Your physician wants you to follow-up in: 12 months.  You will receive a reminder letter in the mail two months in advance. If you don't receive a letter, please call our office to schedule the follow-up appointment. 

## 2013-11-08 NOTE — Assessment & Plan Note (Signed)
Heart rate well controlled, tolerating warfarin well.

## 2013-11-08 NOTE — Progress Notes (Signed)
Patient ID: Mark Padilla, male    DOB: [birth year], 78 y.o.   MRN: [id number]  HPI Comments: 78 year-old gentleman with chronic atrial fibrillation on warfarin, HTN, remote hx of  DC cardioversion that was unsuccessful,   hyperlipidemia.  hip replacement on the left in April 2012, right knee replacement August 2012. Recent back surgery July 2014 with decompression  previously on Zocor and this was stopped after he had significant weight loss, malaise   He reports he is doing better since his low back surgery in July 2014. He is walking 30 minutes per day. Recent upper respiratory infection. Took Mucinex D  and it and had malaise, palpitations. Took in one week to get over this feeling. Now feels back to normal.  no complaints on low-dose Lipitor   He denies chest pain, shortness of breath, edema, or palpitations. no lightheadedness or near syncope.   EKG shows atrial fibrillation with rate of 55 beats per minute,  no significant ST or T wave changes      Outpatient Encounter Prescriptions as of 11/08/2013  Medication Sig  . ALPRAZolam (XANAX) 1 MG tablet TAKE 1 TABLET BY MOUTH TWICE A DAY AS NEEDED  . Ascorbic Acid (VITAMIN C) 500 MG tablet Take 500 mg by mouth daily.    Marland Kitchen atenolol (TENORMIN) 25 MG tablet TAKE 1 TABLET BY MOUTH EVERY DAY  . atorvastatin (LIPITOR) 10 MG tablet TAKE 1 TABLET BY MOUTH DAILY.  Marland Kitchen azelastine (ASTELIN) 137 MCG/SPRAY nasal spray Place 2 sprays into the nose at bedtime.   . fluticasone (FLONASE) 50 MCG/ACT nasal spray USE 2 SPRAYS INTO THE NOSE DAILY  . gabapentin (NEURONTIN) 300 MG capsule TAKE 1 tablet twice daily.  . hydrochlorothiazide (HYDRODIURIL) 25 MG tablet TAKE 1 TABLET BY MOUTH DAILY  . loratadine (CLARITIN) 10 MG tablet Take 1 tablet (10 mg total) by mouth daily.  . meloxicam (MOBIC) 7.5 MG tablet TAKE 1 TABLET BY MOUTH DAILY  . montelukast (SINGULAIR) 10 MG tablet TAKE 1 TABLET (10 MG TOTAL) BY MOUTH AT BEDTIME.  . Multiple Vitamin (MULTIVITAMIN)  capsule Take 1 capsule by mouth daily.    Marland Kitchen omeprazole (PRILOSEC) 40 MG capsule Take 1 capsule (40 mg total) by mouth daily.  Marland Kitchen oxyCODONE-acetaminophen (PERCOCET/ROXICET) 5-325 MG per tablet Take 1 tablet by mouth 2 (two) times daily as needed.  . Potassium 75 MG TABS Take by mouth daily.    Marland Kitchen warfarin (COUMADIN) 5 MG tablet TAKE AS DIRECTED BY COUMADIN CLINIC  . [DISCONTINUED] gabapentin (NEURONTIN) 300 MG capsule TAKE 1 CAPSULE THREE TIMES A DAY  . [DISCONTINUED] sildenafil (VIAGRA) 100 MG tablet Take 1 tablet (100 mg total) by mouth daily as needed.  . [DISCONTINUED] tamsulosin (FLOMAX) 0.4 MG CAPS capsule TAKE ONE CAPSULE BY MOUTH EVERY DAY 30 MINUTES AFTER SAME MEAL EACH DAY     Review of Systems  Constitutional: Negative.   HENT: Negative.   Eyes: Negative.   Respiratory: Negative.   Cardiovascular: Negative.   Gastrointestinal: Negative.   Endocrine: Negative.   Musculoskeletal: Positive for arthralgias and joint swelling.  Skin: Negative.   Allergic/Immunologic: Negative.   Neurological: Negative.   Hematological: Negative.   Psychiatric/Behavioral: Negative.   All other systems reviewed and are negative.    BP 128/84  Pulse 55  Ht 5\' 8"  (1.727 m)  Wt 184 lb (83.462 kg)  BMI 27.98 kg/m2  Physical Exam  Nursing note and vitals reviewed. Constitutional: He is oriented to person, place, and time. He appears well-developed  and well-nourished.  HENT:  Head: Normocephalic.  Nose: Nose normal.  Mouth/Throat: Oropharynx is clear and moist.  Eyes: Conjunctivae are normal. Pupils are equal, round, and reactive to light.  Neck: Normal range of motion. Neck supple. No JVD present.  Cardiovascular: Normal rate, S1 normal, S2 normal and intact distal pulses.  An irregularly irregular rhythm present. Exam reveals no gallop and no friction rub.   Murmur heard.  Crescendo systolic murmur is present with a grade of 1/6  Pulmonary/Chest: Effort normal and breath sounds normal. No  respiratory distress. He has no wheezes. He has no rales. He exhibits no tenderness.  Abdominal: Soft. Bowel sounds are normal. He exhibits no distension. There is no tenderness.  Musculoskeletal: Normal range of motion. He exhibits no edema and no tenderness.  Lymphadenopathy:    He has no cervical adenopathy.  Neurological: He is alert and oriented to person, place, and time. Coordination normal.  Skin: Skin is warm and dry. No rash noted. No erythema.  Psychiatric: He has a normal mood and affect. His behavior is normal. Judgment and thought content normal.      Assessment and Plan

## 2013-11-22 ENCOUNTER — Other Ambulatory Visit: Payer: Self-pay | Admitting: Internal Medicine

## 2013-11-22 NOTE — Telephone Encounter (Signed)
Last refilled 10/11/13 LET[facility]AK PATIENT, Please send back to me for call in

## 2013-11-25 NOTE — Telephone Encounter (Signed)
rx called into pharmacy

## 2013-11-30 ENCOUNTER — Other Ambulatory Visit: Payer: Self-pay | Admitting: Internal Medicine

## 2013-12-02 ENCOUNTER — Ambulatory Visit (INDEPENDENT_AMBULATORY_CARE_PROVIDER_SITE_OTHER): Payer: Medicare Other | Admitting: Family Medicine

## 2013-12-02 DIAGNOSIS — Z7901 Long term (current) use of anticoagulants: Secondary | ICD-10-CM

## 2013-12-02 DIAGNOSIS — Z5181 Encounter for therapeutic drug level monitoring: Secondary | ICD-10-CM

## 2013-12-02 DIAGNOSIS — I4891 Unspecified atrial fibrillation: Secondary | ICD-10-CM

## 2013-12-02 LAB — POCT INR: INR: 1.8

## 2013-12-30 ENCOUNTER — Encounter: Payer: Self-pay | Admitting: Internal Medicine

## 2013-12-30 ENCOUNTER — Ambulatory Visit (INDEPENDENT_AMBULATORY_CARE_PROVIDER_SITE_OTHER): Payer: Medicare Other | Admitting: Internal Medicine

## 2013-12-30 VITALS — BP 140/80 | HR 83 | Temp 98.4°F | Ht 68.0 in | Wt 183.0 lb

## 2013-12-30 DIAGNOSIS — M51369 Other intervertebral disc degeneration, lumbar region without mention of lumbar back pain or lower extremity pain: Secondary | ICD-10-CM

## 2013-12-30 DIAGNOSIS — M5137 Other intervertebral disc degeneration, lumbosacral region: Secondary | ICD-10-CM

## 2013-12-30 DIAGNOSIS — I10 Essential (primary) hypertension: Secondary | ICD-10-CM

## 2013-12-30 DIAGNOSIS — Z0000 Encounter for general adult medical examination without abnormal findings: Secondary | ICD-10-CM

## 2013-12-30 DIAGNOSIS — E785 Hyperlipidemia, unspecified: Secondary | ICD-10-CM

## 2013-12-30 DIAGNOSIS — I4891 Unspecified atrial fibrillation: Secondary | ICD-10-CM

## 2013-12-30 DIAGNOSIS — N400 Enlarged prostate without lower urinary tract symptoms: Secondary | ICD-10-CM

## 2013-12-30 DIAGNOSIS — M5136 Other intervertebral disc degeneration, lumbar region: Secondary | ICD-10-CM

## 2013-12-30 DIAGNOSIS — R7301 Impaired fasting glucose: Secondary | ICD-10-CM

## 2013-12-30 LAB — COMPREHENSIVE METABOLIC PANEL
ALT: 28 U/L (ref 0–53)
AST: 39 U/L — ABNORMAL HIGH (ref 0–37)
Albumin: 4.3 g/dL (ref 3.5–5.2)
Alkaline Phosphatase: 66 U/L (ref 39–117)
BUN: 14 mg/dL (ref 6–23)
CO2: 28 mEq/L (ref 19–32)
Calcium: 9.4 mg/dL (ref 8.4–10.5)
Chloride: 100 mEq/L (ref 96–112)
Creatinine, Ser: 0.9 mg/dL (ref 0.4–1.5)
GFR: 92.88 mL/min (ref >60.00)
Glucose, Bld: 91 mg/dL (ref 70–99)
Potassium: 3.7 mEq/L (ref 3.5–5.1)
Sodium: 137 mEq/L (ref 135–145)
Total Bilirubin: 1.1 mg/dL (ref 0.3–1.2)
Total Protein: 7.2 g/dL (ref 6.0–8.3)

## 2013-12-30 LAB — CBC WITH DIFFERENTIAL/PLATELET
Basophils Absolute: 0 10*3/uL (ref 0.0–0.1)
Basophils Relative: 0.6 % (ref 0.0–3.0)
Eosinophils Absolute: 0.3 10*3/uL (ref 0.0–0.7)
Eosinophils Relative: 4.6 % (ref 0.0–5.0)
HCT: 43.9 % (ref 39.0–52.0)
Hemoglobin: 14.4 g/dL (ref 13.0–17.0)
Lymphocytes Relative: 29 % (ref 12.0–46.0)
Lymphs Abs: 1.9 10*3/uL (ref 0.7–4.0)
MCHC: 32.8 g/dL (ref 30.0–36.0)
MCV: 92.8 fl (ref 78.0–100.0)
Monocytes Absolute: 0.5 10*3/uL (ref 0.1–1.0)
Monocytes Relative: 7.7 % (ref 3.0–12.0)
Neutro Abs: 3.7 10*3/uL (ref 1.4–7.7)
Neutrophils Relative %: 58.1 % (ref 43.0–77.0)
Platelets: 220 10*3/uL (ref 150.0–400.0)
RBC: 4.72 Mil/uL (ref 4.22–5.81)
RDW: 13.7 % (ref 11.5–14.6)
WBC: 6.4 10*3/uL (ref 4.5–10.5)

## 2013-12-30 LAB — LIPID PANEL
Cholesterol: 146 mg/dL (ref 0–200)
HDL: 50.4 mg/dL (ref >39.00)
LDL Cholesterol: 83 mg/dL (ref 0–99)
Total CHOL/HDL Ratio: 3
Triglycerides: 62 mg/dL (ref 0.0–149.0)
VLDL: 12.4 mg/dL (ref 0.0–40.0)

## 2013-12-30 LAB — TSH: TSH: 0.81 u[IU]/mL (ref 0.35–5.50)

## 2013-12-30 LAB — T4, FREE: Free T4: 0.84 ng/dL (ref 0.60–1.60)

## 2013-12-30 LAB — HEMOGLOBIN A1C: Hgb A1c MFr Bld: 5.7 % (ref 4.6–6.5)

## 2013-12-30 MED ORDER — OXYCODONE-ACETAMINOPHEN 5-325 MG PO TABS: 1.0000 | ORAL_TABLET | Freq: Two times a day (BID) | ORAL | Status: DC | PRN

## 2013-12-30 NOTE — Assessment & Plan Note (Signed)
BP Readings from Last 3 Encounters:  12/30/13 140/80  11/08/13 128/84  09/09/13 120/60   Good control No changes needed

## 2013-12-30 NOTE — Assessment & Plan Note (Signed)
Uses the oxycodone sometimes still

## 2013-12-30 NOTE — Assessment & Plan Note (Signed)
UTD on imms and colonoscopy No PSA due to age

## 2013-12-30 NOTE — Progress Notes (Signed)
Pre-visit discussion using our clinic review tool. No additional management support is needed unless otherwise documented below in the visit note.  

## 2013-12-30 NOTE — Assessment & Plan Note (Signed)
Voids okay with the tamsulosin 

## 2013-12-30 NOTE — Progress Notes (Signed)
Subjective:    Patient ID: Mark Padilla, male    DOB: 04/27/1935, 79 y.o.   MRN: [id number]  HPI Here for physical No falls Gets down "on rainy days looking for someplace to walk" Not anhedonic but can't do all he wanted  Still recovering from laminectomy Radicular pain is better but discs still not right Can't bend over and limited still Still has problems working over his head due to ongoing shoulder problems  Tried off the tamsulosin  Cramps stopped and didn't restart when he restarted after a few weeks Has good stream---still slow emptying Nocturia only occasionally  . Current Outpatient Prescriptions on File Prior to Visit  Medication Sig Dispense Refill  . ALPRAZolam (XANAX) 1 MG tablet TAKE 1 TABLET BY MOUTH TWICE A DAY AS NEEDED  60 tablet  0  . Ascorbic Acid (VITAMIN C) 500 MG tablet Take 500 mg by mouth daily.        Marland Kitchen. atenolol (TENORMIN) 25 MG tablet TAKE 1 TABLET BY MOUTH EVERY DAY  90 tablet  2  . atorvastatin (LIPITOR) 10 MG tablet TAKE 1 TABLET BY MOUTH DAILY.  90 tablet  2  . azelastine (ASTELIN) 137 MCG/SPRAY nasal spray Place 2 sprays into the nose at bedtime.       . fluticasone (FLONASE) 50 MCG/ACT nasal spray USE 2 SPRAYS INTO THE NOSE DAILY  48 g  2  . gabapentin (NEURONTIN) 300 MG capsule TAKE 1 tablet twice daily.      . hydrochlorothiazide (HYDRODIURIL) 25 MG tablet TAKE 1 TABLET BY MOUTH DAILY  90 tablet  3  . loratadine (CLARITIN) 10 MG tablet Take 1 tablet (10 mg total) by mouth daily.  90 tablet  0  . meloxicam (MOBIC) 7.5 MG tablet TAKE 1 TABLET BY MOUTH DAILY  90 tablet  0  . montelukast (SINGULAIR) 10 MG tablet TAKE 1 TABLET (10 MG TOTAL) BY MOUTH AT BEDTIME.  90 tablet  2  . Multiple Vitamin (MULTIVITAMIN) capsule Take 1 capsule by mouth daily.        Marland Kitchen. omeprazole (PRILOSEC) 40 MG capsule Take 1 capsule (40 mg total) by mouth daily.  90 capsule  0  . oxyCODONE-acetaminophen (PERCOCET/ROXICET) 5-325 MG per tablet Take 1 tablet by mouth 2 (two)  times daily as needed.  60 tablet  0  . Potassium 75 MG TABS Take by mouth daily.        Marland Kitchen. warfarin (COUMADIN) 5 MG tablet TAKE AS DIRECTED BY COUMADIN CLINIC  105 tablet  0   No current facility-administered medications on file prior to visit.    Allergies  Allergen Reactions  . Simvastatin     Listlessness, fatigue  . Mucinex D [Pseudoephedrine-Guaifenesin Er]   . Undecylenic Acid   . Flexeril [Cyclobenzaprine Hcl] Anxiety  . Skelaxin Anxiety    Past Medical History  Diagnosis Date  . Hyperlipidemia   . Hypertension   . Diverticulitis   . Atrial fibrillation     chronic persistent, failed DCCV x3 in 2000  . ED (erectile dysfunction)   . Sleep disturbance   . Osteoarthritis   . Lumbar disc disease   . Cervical disc disease   . Impaired fasting glucose   . GERD (gastroesophageal reflux disease)   . BPH (benign prostatic hypertrophy)   . Allergic rhinitis     Past Surgical History  Procedure Laterality Date  . Shoulder surgery  1950's    dislocation  . Knee arthroscopy  06/1998  right Jefm Ciliberto)  . Hemiarthroplasty shoulder fracture  10/2004    left  . Shoulder surgery  12/2004    right  . Thr--left  4/12    Dr Jefm Lenz  . Joint replacement  4/12    Left hip--Dr Jefm Dever  . Replacement unicondylar joint knee  8/12    Dr Jefm Strickland  . Colonoscopy  2013  . Lumbar laminectomy  06/10/13    [facility]  . Cataract extraction, bilateral  2014    Family History  Problem Relation Age of Onset  . Stomach cancer Mother   . Stroke Father   . Colon polyps Brother   . Colon cancer Neg Hx     History   Social History  . Marital Status: Divorced    Spouse Name: N/A    Number of Children: 2  . Years of Education: N/A   Occupational History  . retired, [employer] works part time [place]  . Smoking status: Former Smoker    Types: Cigarettes    Quit date: 11/21/1962  . Smokeless tobacco: Never Used  . Alcohol Use: Yes      Comment: occasional  . Drug Use: No  . Sexual Activity: Not on file   Other Topics Concern  . Not on file   Social History Narrative   Plans to do living will   Will designate daughter [name] as health care POA. Lives with her   Would accept resuscitation but no prolonged artificial ventilation   Would not want tube feeds if cognitively unaware   Review of Systems  Constitutional: Negative for fatigue and unexpected weight change.       Wears seat belt  HENT: Positive for hearing loss and tinnitus. Negative for dental problem.        Tinnitus mostly gone Left ear is worse than right Full dentures--getting new ones  Eyes: Negative for visual disturbance.       No diplopia or unilateral vision loss  Respiratory: Positive for wheezing. Negative for cough, chest tightness and shortness of breath.   Cardiovascular: Positive for palpitations. Negative for chest pain and leg swelling.  Gastrointestinal: Negative for abdominal pain and blood in stool.       Occasional indigestion Uses pill for his bowels  Endocrine: Negative for cold intolerance and heat intolerance.  Genitourinary: Positive for frequency and difficulty urinating.  Musculoskeletal: Positive for arthralgias and back pain.       Capsaicin helps his shoulders  Skin: Negative for rash.       No suspicious lesions--has irritated spot on right bridge of nose  Allergic/Immunologic: Positive for environmental allergies. Negative for immunocompromised state.       Allergies controlled with meds  Neurological: Positive for dizziness and headaches. Negative for syncope, weakness and light-headedness.  Hematological: Negative for adenopathy. Bruises/bleeds easily.  Psychiatric/Behavioral: Positive for sleep disturbance. Negative for dysphoric mood. The patient is not nervous/anxious.        Frustrated by his limitations Only sleeps 5-6 hours even with xanax Doesn't nap--occasionally tired in day Discussed changing gabapentin to  bedtime (or 300/600)       Objective:   Physical Exam  Constitutional: He is oriented to person, place, and time. He appears well-developed and well-nourished. No distress.  HENT:  Head: Normocephalic and atraumatic.  Right Ear: External ear normal.  Left Ear: External ear normal.  Mouth/Throat: Oropharynx is clear and moist. No oropharyngeal exudate.  Eyes: Conjunctivae and EOM are normal. Pupils are  equal, round, and reactive to light.  Neck: Normal range of motion. Neck supple. No thyromegaly present.  Cardiovascular: Normal rate, normal heart sounds and intact distal pulses.  Exam reveals no gallop.   No murmur heard. irregular  Pulmonary/Chest: Effort normal and breath sounds normal. No respiratory distress. He has no wheezes. He has no rales.  Abdominal: Soft. There is no tenderness.  Musculoskeletal: He exhibits no edema and no tenderness.  Lymphadenopathy:    He has no cervical adenopathy.  Neurological: He is alert and oriented to person, place, and time.  Skin: No rash noted. No erythema.  Psychiatric: He has a normal mood and affect. His behavior is normal.          Assessment & Plan:

## 2013-12-30 NOTE — Assessment & Plan Note (Signed)
No problems with statin Did have some aching but this is better

## 2013-12-30 NOTE — Assessment & Plan Note (Signed)
Good rate control On coumadin 

## 2013-12-31 ENCOUNTER — Telehealth: Payer: Self-pay | Admitting: Internal Medicine

## 2013-12-31 ENCOUNTER — Encounter: Payer: Self-pay | Admitting: Family Medicine

## 2013-12-31 NOTE — Telephone Encounter (Signed)
Relevant patient education mailed to patient.  

## 2014-01-10 ENCOUNTER — Telehealth: Payer: Self-pay | Admitting: *Deleted

## 2014-01-10 NOTE — Telephone Encounter (Signed)
Spoke w/ pt.  He reports burping and pressure in his chest, BP 127/34, HR 58. Reports that he feels better after taking a second omeprazole this afternoon. Advised pt to try to drink some soda to see if can burp to relieve the pressure.  Pt reports that he has a h/o indigestion and that it was greatly improved when Dr. Rockey Situ started him on omeprazole, but doesn't feel that it is enough today. Pt reports that he has h/o back surgery and has a bulging herniated disc in his neck and is worried that the 2 are related. Advised pt to call PCP and that if his symptoms are not resolved soon, he can call back to the office (speak w/ on call NP after hrs for advice) or go to the ED. He is agreeable w/ this and states that he had CPE recently that "looked great". Pt to call w/ further questions or concerns.

## 2014-01-10 NOTE — Telephone Encounter (Signed)
Patient called and isn't feeling well. He is having chest pains, dizzy and burping. Please call patient.

## 2014-01-13 ENCOUNTER — Ambulatory Visit (INDEPENDENT_AMBULATORY_CARE_PROVIDER_SITE_OTHER): Payer: Medicare Other | Admitting: Family Medicine

## 2014-01-13 ENCOUNTER — Other Ambulatory Visit: Payer: Self-pay | Admitting: *Deleted

## 2014-01-13 DIAGNOSIS — Z5181 Encounter for therapeutic drug level monitoring: Secondary | ICD-10-CM

## 2014-01-13 DIAGNOSIS — Z7901 Long term (current) use of anticoagulants: Secondary | ICD-10-CM

## 2014-01-13 DIAGNOSIS — I4891 Unspecified atrial fibrillation: Secondary | ICD-10-CM

## 2014-01-13 LAB — POCT INR: INR: 2.9

## 2014-01-13 MED ORDER — ALPRAZOLAM 1 MG PO TABS: 1.0000 mg | ORAL_TABLET | Freq: Two times a day (BID) | ORAL | Status: DC | PRN

## 2014-01-13 NOTE — Telephone Encounter (Signed)
Okay #60 x 0 

## 2014-01-13 NOTE — Telephone Encounter (Signed)
rx called into pharmacy Left message that rx was phoned in.

## 2014-01-17 ENCOUNTER — Other Ambulatory Visit: Payer: Self-pay | Admitting: Internal Medicine

## 2014-01-20 ENCOUNTER — Emergency Department: Payer: Self-pay | Admitting: Emergency Medicine

## 2014-01-20 ENCOUNTER — Telehealth: Payer: Self-pay

## 2014-01-20 LAB — BASIC METABOLIC PANEL
Anion Gap: 3 — ABNORMAL LOW (ref 7–16)
BUN: 15 mg/dL (ref 7–18)
Calcium, Total: 8.5 mg/dL (ref 8.5–10.1)
Chloride: 103 mmol/L (ref 98–107)
Co2: 30 mmol/L (ref 21–32)
Creatinine: 1.25 mg/dL (ref 0.60–1.30)
EGFR (African American): >60
EGFR (Non-African Amer.): 55 — ABNORMAL LOW
Glucose: 117 mg/dL — ABNORMAL HIGH (ref 65–99)
Osmolality: 274 (ref 275–301)
Potassium: 3.9 mmol/L (ref 3.5–5.1)
Sodium: 136 mmol/L (ref 136–145)

## 2014-01-20 LAB — CBC
HCT: 40 % (ref 40.0–52.0)
HGB: 13.9 g/dL (ref 13.0–18.0)
MCH: 31.8 pg (ref 26.0–34.0)
MCHC: 34.8 g/dL (ref 32.0–36.0)
MCV: 92 fL (ref 80–100)
Platelet: 188 10*3/uL (ref 150–440)
RBC: 4.37 10*6/uL — ABNORMAL LOW (ref 4.40–5.90)
RDW: 13.2 % (ref 11.5–14.5)
WBC: 5.5 10*3/uL (ref 3.8–10.6)

## 2014-01-20 LAB — PROTIME-INR
INR: 2.3
Prothrombin Time: 25 secs — ABNORMAL HIGH (ref 11.5–14.7)

## 2014-01-20 LAB — TROPONIN I: Troponin-I: <0.02 ng/mL

## 2014-01-20 LAB — PRO B NATRIURETIC PEPTIDE: B-Type Natriuretic Peptide: 739 pg/mL — ABNORMAL HIGH (ref 0–450)

## 2014-01-20 NOTE — Telephone Encounter (Signed)
Since 01/17/14 pt has had CP on and off; last night had CP x 2 during the night; pt said has atrial fib, now pt experiencing severe SOB, pt having trouble getting his breath; advised pt needs to be eval at ED. Pt does not want to call 911, pts daughter is there with him and he lives less than 5 mins form [facility]. Pt will go to [facility] now.

## 2014-01-21 NOTE — Telephone Encounter (Signed)
Not at [facility] or in the ER Called his home and left a message Please try back later to see what happened

## 2014-01-21 NOTE — Telephone Encounter (Signed)
Spoke with patient and he went to [facility] for SOB, did EKG, labwork and everything was normal. Pt shoveled the driveway and then went for his walk and got "shortwinded" and thought he was having a heart attack.  Pt feels fine and will call for follow-up.

## 2014-01-21 NOTE — Telephone Encounter (Signed)
Sounds good

## 2014-02-17 ENCOUNTER — Other Ambulatory Visit: Payer: Self-pay

## 2014-02-17 NOTE — Telephone Encounter (Signed)
Pt request rx oxycodone apap. Call when ready for pick up. 

## 2014-02-18 MED ORDER — OXYCODONE-ACETAMINOPHEN 5-325 MG PO TABS: 1.0000 | ORAL_TABLET | Freq: Two times a day (BID) | ORAL | Status: DC | PRN

## 2014-02-18 NOTE — Telephone Encounter (Signed)
Spoke with patient and advised rx ready for pick-up and it will be at the front desk.  

## 2014-02-20 ENCOUNTER — Ambulatory Visit: Payer: Medicare Other

## 2014-02-24 ENCOUNTER — Ambulatory Visit: Payer: Medicare Other

## 2014-02-28 ENCOUNTER — Other Ambulatory Visit: Payer: Self-pay | Admitting: Internal Medicine

## 2014-02-28 NOTE — Telephone Encounter (Signed)
Okay alprazolam #60 x 0  Gabapentin for a year

## 2014-02-28 NOTE — Telephone Encounter (Signed)
rx called into pharmacy rx sent to pharmacy by e-script  

## 2014-03-03 ENCOUNTER — Ambulatory Visit (INDEPENDENT_AMBULATORY_CARE_PROVIDER_SITE_OTHER): Payer: Medicare Other | Admitting: Family Medicine

## 2014-03-03 DIAGNOSIS — Z7901 Long term (current) use of anticoagulants: Secondary | ICD-10-CM

## 2014-03-03 DIAGNOSIS — I4891 Unspecified atrial fibrillation: Secondary | ICD-10-CM

## 2014-03-03 DIAGNOSIS — Z5181 Encounter for therapeutic drug level monitoring: Secondary | ICD-10-CM

## 2014-03-03 LAB — POCT INR: INR: 2.3

## 2014-03-26 ENCOUNTER — Other Ambulatory Visit: Payer: Self-pay | Admitting: Internal Medicine

## 2014-04-13 ENCOUNTER — Other Ambulatory Visit: Payer: Self-pay | Admitting: Internal Medicine

## 2014-04-15 ENCOUNTER — Other Ambulatory Visit: Payer: Self-pay

## 2014-04-15 MED ORDER — OXYCODONE-ACETAMINOPHEN 5-325 MG PO TABS: 1.0000 | ORAL_TABLET | Freq: Two times a day (BID) | ORAL | Status: DC | PRN

## 2014-04-15 NOTE — Telephone Encounter (Signed)
Spoke with patient and advised rx ready for pick-up and it will be at the front desk.  

## 2014-04-15 NOTE — Telephone Encounter (Signed)
Pt request rx oxycodone apap. Call when ready for pick up. 

## 2014-04-17 ENCOUNTER — Ambulatory Visit: Payer: Medicare Other

## 2014-04-18 ENCOUNTER — Other Ambulatory Visit: Payer: Self-pay | Admitting: Internal Medicine

## 2014-04-24 ENCOUNTER — Other Ambulatory Visit: Payer: Self-pay | Admitting: Internal Medicine

## 2014-04-24 ENCOUNTER — Ambulatory Visit (INDEPENDENT_AMBULATORY_CARE_PROVIDER_SITE_OTHER): Payer: Medicare Other | Admitting: Family Medicine

## 2014-04-24 DIAGNOSIS — Z7901 Long term (current) use of anticoagulants: Secondary | ICD-10-CM

## 2014-04-24 DIAGNOSIS — Z5181 Encounter for therapeutic drug level monitoring: Secondary | ICD-10-CM

## 2014-04-24 DIAGNOSIS — I4891 Unspecified atrial fibrillation: Secondary | ICD-10-CM

## 2014-04-24 LAB — POCT INR: INR: 2.9

## 2014-04-24 NOTE — Telephone Encounter (Signed)
02/28/14 

## 2014-04-25 NOTE — Telephone Encounter (Signed)
Okay #60 x 0 

## 2014-04-25 NOTE — Telephone Encounter (Signed)
rx called into pharmacy

## 2014-05-19 ENCOUNTER — Other Ambulatory Visit: Payer: Self-pay | Admitting: Internal Medicine

## 2014-05-31 ENCOUNTER — Other Ambulatory Visit: Payer: Self-pay | Admitting: Internal Medicine

## 2014-06-02 ENCOUNTER — Other Ambulatory Visit: Payer: Self-pay

## 2014-06-02 MED ORDER — OXYCODONE-ACETAMINOPHEN 5-325 MG PO TABS: 1.0000 | ORAL_TABLET | Freq: Two times a day (BID) | ORAL | Status: DC | PRN

## 2014-06-02 NOTE — Telephone Encounter (Signed)
Spoke with patient and advised rx ready for pick-up and it will be at the front desk.  

## 2014-06-02 NOTE — Telephone Encounter (Signed)
Pt left v/m requesting rx oxycodone apap; pt has 6 pills left but pt will be going out of town on 06/04/14 for one week. Pt request cb to pick up rx ASAP.

## 2014-06-05 ENCOUNTER — Ambulatory Visit (INDEPENDENT_AMBULATORY_CARE_PROVIDER_SITE_OTHER): Payer: Medicare Other | Admitting: Family Medicine

## 2014-06-05 DIAGNOSIS — Z7901 Long term (current) use of anticoagulants: Secondary | ICD-10-CM

## 2014-06-05 DIAGNOSIS — I4891 Unspecified atrial fibrillation: Secondary | ICD-10-CM

## 2014-06-05 DIAGNOSIS — Z5181 Encounter for therapeutic drug level monitoring: Secondary | ICD-10-CM

## 2014-06-05 LAB — POCT INR: INR: 3.1

## 2014-06-13 ENCOUNTER — Other Ambulatory Visit: Payer: Self-pay | Admitting: Internal Medicine

## 2014-06-13 NOTE — Telephone Encounter (Signed)
04/25/14 

## 2014-06-14 NOTE — Telephone Encounter (Signed)
Okay #60 x 0 

## 2014-06-16 NOTE — Telephone Encounter (Signed)
rx called into pharmacy

## 2014-06-19 ENCOUNTER — Other Ambulatory Visit: Payer: Self-pay | Admitting: Internal Medicine

## 2014-07-01 ENCOUNTER — Encounter: Payer: Self-pay | Admitting: Internal Medicine

## 2014-07-01 ENCOUNTER — Encounter: Payer: Self-pay | Admitting: *Deleted

## 2014-07-01 ENCOUNTER — Ambulatory Visit (INDEPENDENT_AMBULATORY_CARE_PROVIDER_SITE_OTHER): Payer: Medicare Other | Admitting: Internal Medicine

## 2014-07-01 VITALS — BP 110/80 | HR 56 | Temp 98.0°F | Wt 180.0 lb

## 2014-07-01 DIAGNOSIS — I4819 Other persistent atrial fibrillation: Secondary | ICD-10-CM

## 2014-07-01 DIAGNOSIS — M5136 Other intervertebral disc degeneration, lumbar region: Secondary | ICD-10-CM

## 2014-07-01 DIAGNOSIS — I4891 Unspecified atrial fibrillation: Secondary | ICD-10-CM

## 2014-07-01 DIAGNOSIS — I10 Essential (primary) hypertension: Secondary | ICD-10-CM

## 2014-07-01 DIAGNOSIS — N400 Enlarged prostate without lower urinary tract symptoms: Secondary | ICD-10-CM

## 2014-07-01 DIAGNOSIS — M5137 Other intervertebral disc degeneration, lumbosacral region: Secondary | ICD-10-CM

## 2014-07-01 DIAGNOSIS — G479 Sleep disorder, unspecified: Secondary | ICD-10-CM

## 2014-07-01 NOTE — Assessment & Plan Note (Signed)
BP Readings from Last 3 Encounters:  07/01/14 110/80  12/30/13 140/80  11/08/13 128/84

## 2014-07-01 NOTE — Progress Notes (Signed)
Subjective:    Patient ID: Mark Padilla, male    DOB: 1935-08-21, 79 y.o.   MRN: [id number]  HPI Doing okay  Still with ongoing back pain Doesn't have the disc and nerve pain as bad---but will come on with walking Okay if he just sits but inactivity does make it worse Will feel good when he is first walking--till he goes too far Tries to limit oxycodone to once a day Wonders if he needs other surgery  Usually voids okay No nocturia lately No sig daytime urgency or frequency  No chest pain No SOB with exertion Some trouble breathing through right nostril---azelastine helps No syncope Has frequent dizzy feeling--notes when he is walking. Feels he is off balance due to past left hip surgery No palpitations No problems with coumadin--though he does bleed easy (like after trimming bushes)  Uses alprazolam to help sleep This has really helped Rarely will use during the day---if he is upset or nervous  Current Outpatient Prescriptions on File Prior to Visit  Medication Sig Dispense Refill  . ALPRAZolam (XANAX) 1 MG tablet TAKE 1 TABLET BY MOUTH TWICE A DAY AS NEEDED  60 tablet  0  . Ascorbic Acid (VITAMIN C) 500 MG tablet Take 500 mg by mouth daily.        Marland Kitchen atenolol (TENORMIN) 25 MG tablet TAKE 1 TABLET BY MOUTH EVERY DAY  90 tablet  2  . atorvastatin (LIPITOR) 10 MG tablet TAKE 1 TABLET EVERY DAY  90 tablet  2  . azelastine (ASTELIN) 137 MCG/SPRAY nasal spray Place 2 sprays into the nose at bedtime.       . fluticasone (FLONASE) 50 MCG/ACT nasal spray USE 2 SPRAYS INTO THE NOSE DAILY  16 g  5  . gabapentin (NEURONTIN) 300 MG capsule TAKE 1 CAPSULE THREE TIMES A DAY  90 capsule  3  . hydrochlorothiazide (HYDRODIURIL) 25 MG tablet TAKE 1 TABLET BY MOUTH DAILY  90 tablet  3  . loratadine (CLARITIN) 10 MG tablet Take 1 tablet (10 mg total) by mouth daily.  90 tablet  0  . meloxicam (MOBIC) 7.5 MG tablet TAKE 1 TABLET BY MOUTH DAILY  90 tablet  0  . montelukast (SINGULAIR) 10 MG  tablet TAKE 1 TABLET BY MOUTH AT BEDTIME  90 tablet  2  . Multiple Vitamin (MULTIVITAMIN) capsule Take 1 capsule by mouth daily.        Marland Kitchen omeprazole (PRILOSEC) 40 MG capsule Take 1 capsule (40 mg total) by mouth daily.  90 capsule  0  . oxyCODONE-acetaminophen (PERCOCET/ROXICET) 5-325 MG per tablet Take 1 tablet by mouth 2 (two) times daily as needed.  60 tablet  0  . Potassium 75 MG TABS Take by mouth daily.        . tamsulosin (FLOMAX) 0.4 MG CAPS capsule Take 0.4 mg by mouth daily.       Marland Kitchen warfarin (COUMADIN) 5 MG tablet TAKE AS DIRECTED BY COUMADIN CLINIC  105 tablet  0   No current facility-administered medications on file prior to visit.    Allergies  Allergen Reactions  . Simvastatin     Listlessness, fatigue  . Mucinex D [Pseudoephedrine-Guaifenesin Er]   . Undecylenic Acid   . Flexeril [Cyclobenzaprine Hcl] Anxiety  . Skelaxin Anxiety    Past Medical History  Diagnosis Date  . Hyperlipidemia   . Hypertension   . Diverticulitis   . Atrial fibrillation     chronic persistent, failed DCCV x3 in 2000  .  ED (erectile dysfunction)   . Sleep disturbance   . Osteoarthritis   . Lumbar disc disease   . Cervical disc disease   . Impaired fasting glucose   . GERD (gastroesophageal reflux disease)   . BPH (benign prostatic hypertrophy)   . Allergic rhinitis     Past Surgical History  Procedure Laterality Date  . Shoulder surgery  1950's    dislocation  . Knee arthroscopy  06/1998    right Jefm Rabelo)  . Hemiarthroplasty shoulder fracture  10/2004    left  . Shoulder surgery  12/2004    right  . Thr--left  4/12    Dr Jefm Buysse  . Joint replacement  4/12    Left hip--Dr Jefm Diluzio  . Replacement unicondylar joint knee  8/12    Dr Jefm Cendejas  . Colonoscopy  2013  . Lumbar laminectomy  06/10/13    [facility]  . Cataract extraction, bilateral  2014    Family History  Problem Relation Age of Onset  . Stomach cancer Mother   . Stroke Father   . Colon polyps Brother   . Colon  cancer Neg Hx     History   Social History  . Marital Status: Divorced    Spouse Name: N/A    Number of Children: 2  . Years of Education: N/A   Occupational History  . retired, [employer] works part time [place]  . Smoking status: Former Smoker    Types: Cigarettes    Quit date: 11/21/1962  . Smokeless tobacco: Never Used  . Alcohol Use: Yes     Comment: occasional  . Drug Use: No  . Sexual Activity: Not on file   Other Topics Concern  . Not on file   Social History Narrative   Plans to do living will   Will designate daughter [name] as health care POA. Lives with her   Would accept resuscitation but no prolonged artificial ventilation   Would not want tube feeds if cognitively unaware   Review of Systems Takes senna plus for his bowels-- 2 daily Appetite is fine--trying to lose some weight Allergies okay with the nasal sprays    Objective:   Physical Exam  Constitutional: He appears well-developed and well-nourished. No distress.  Neck: Normal range of motion. Neck supple. No thyromegaly present.  Cardiovascular: Normal rate and normal heart sounds.  Exam reveals no gallop.   No murmur heard. Irregular   Pulmonary/Chest: Effort normal and breath sounds normal. No respiratory distress. He has no wheezes. He has no rales.  Musculoskeletal: He exhibits no edema and no tenderness.  Lymphadenopathy:    He has no cervical adenopathy.  Psychiatric: He has a normal mood and affect. His behavior is normal.          Assessment & Plan:

## 2014-07-01 NOTE — Progress Notes (Signed)
Pre visit review using our clinic review tool, if applicable. No additional management support is needed unless otherwise documented below in the visit note. 

## 2014-07-01 NOTE — Assessment & Plan Note (Signed)
Sleeps well with the alprazolam Uses it occasionally for anxiety in daytime

## 2014-07-01 NOTE — Assessment & Plan Note (Signed)
Recovered from the surgery but still symptomatic Tries to control use of oxycodone

## 2014-07-01 NOTE — Patient Instructions (Addendum)
Please try going without the meloxicam---if you don't notice a major difference in your back pain, please stay off it. You can also try decreasing the gabapentin to see if you can tolerate it.

## 2014-07-01 NOTE — Assessment & Plan Note (Signed)
Good rate control On the coumadin

## 2014-07-01 NOTE — Assessment & Plan Note (Signed)
Voiding well now No changes needed

## 2014-07-17 ENCOUNTER — Ambulatory Visit (INDEPENDENT_AMBULATORY_CARE_PROVIDER_SITE_OTHER): Payer: Medicare Other | Admitting: Family Medicine

## 2014-07-17 ENCOUNTER — Other Ambulatory Visit: Payer: Self-pay | Admitting: Internal Medicine

## 2014-07-17 DIAGNOSIS — I4819 Other persistent atrial fibrillation: Secondary | ICD-10-CM

## 2014-07-17 DIAGNOSIS — Z7901 Long term (current) use of anticoagulants: Secondary | ICD-10-CM

## 2014-07-17 DIAGNOSIS — Z5181 Encounter for therapeutic drug level monitoring: Secondary | ICD-10-CM

## 2014-07-17 DIAGNOSIS — I4891 Unspecified atrial fibrillation: Secondary | ICD-10-CM

## 2014-07-17 LAB — POCT INR: INR: 3

## 2014-07-29 ENCOUNTER — Other Ambulatory Visit: Payer: Self-pay | Admitting: Family Medicine

## 2014-07-29 MED ORDER — OXYCODONE-ACETAMINOPHEN 5-325 MG PO TABS: 1.0000 | ORAL_TABLET | Freq: Two times a day (BID) | ORAL | Status: DC | PRN

## 2014-07-29 NOTE — Telephone Encounter (Signed)
Spoke with patient and advised rx ready for pick-up and it will be at the front desk.  

## 2014-07-29 NOTE — Telephone Encounter (Signed)
Last filled 06/02/14

## 2014-07-31 ENCOUNTER — Other Ambulatory Visit: Payer: Self-pay | Admitting: Internal Medicine

## 2014-08-07 ENCOUNTER — Other Ambulatory Visit: Payer: Self-pay | Admitting: Internal Medicine

## 2014-08-07 NOTE — Telephone Encounter (Signed)
rx called into pharmacy

## 2014-08-07 NOTE — Telephone Encounter (Signed)
06/16/14 

## 2014-08-07 NOTE — Telephone Encounter (Signed)
Okay #60 x 0 

## 2014-08-17 ENCOUNTER — Other Ambulatory Visit: Payer: Self-pay | Admitting: Internal Medicine

## 2014-09-01 ENCOUNTER — Ambulatory Visit (INDEPENDENT_AMBULATORY_CARE_PROVIDER_SITE_OTHER): Payer: Medicare Other | Admitting: *Deleted

## 2014-09-01 DIAGNOSIS — I4819 Other persistent atrial fibrillation: Secondary | ICD-10-CM

## 2014-09-01 DIAGNOSIS — I4891 Unspecified atrial fibrillation: Secondary | ICD-10-CM

## 2014-09-01 DIAGNOSIS — I481 Persistent atrial fibrillation: Secondary | ICD-10-CM

## 2014-09-01 DIAGNOSIS — Z5181 Encounter for therapeutic drug level monitoring: Secondary | ICD-10-CM

## 2014-09-01 DIAGNOSIS — Z7901 Long term (current) use of anticoagulants: Secondary | ICD-10-CM

## 2014-09-01 LAB — POCT INR: INR: 3.1

## 2014-09-24 ENCOUNTER — Other Ambulatory Visit: Payer: Self-pay | Admitting: Internal Medicine

## 2014-09-24 ENCOUNTER — Other Ambulatory Visit: Payer: Self-pay

## 2014-09-24 NOTE — Telephone Encounter (Signed)
Patient left vm request rx for oxycodone-acetaminophen 5-325mg . Patient stated he only has 3tabs left. Call when ready for pick up.

## 2014-09-24 NOTE — Addendum Note (Signed)
Addended by: Despina Hidden on: 09/24/2014 03:47 PM   Modules accepted: Orders

## 2014-09-24 NOTE — Telephone Encounter (Signed)
07/29/14 

## 2014-09-25 MED ORDER — OXYCODONE-ACETAMINOPHEN 5-325 MG PO TABS: 1.0000 | ORAL_TABLET | Freq: Two times a day (BID) | ORAL | Status: DC | PRN

## 2014-09-25 NOTE — Telephone Encounter (Signed)
Spoke with patient and advised rx ready for pick-up and it will be at the front desk.  

## 2014-09-25 NOTE — Addendum Note (Signed)
Addended by: Viviana Simpler I on: 09/25/2014 02:31 PM   Modules accepted: Orders

## 2014-10-01 ENCOUNTER — Other Ambulatory Visit: Payer: Self-pay | Admitting: *Deleted

## 2014-10-01 MED ORDER — WARFARIN SODIUM 5 MG PO TABS: ORAL_TABLET | ORAL | Status: DC

## 2014-10-02 ENCOUNTER — Other Ambulatory Visit: Payer: Self-pay | Admitting: Internal Medicine

## 2014-10-02 NOTE — Telephone Encounter (Signed)
08/07/14 

## 2014-10-02 NOTE — Telephone Encounter (Signed)
Okay #60 x 0 

## 2014-10-02 NOTE — Telephone Encounter (Signed)
rx called into pharmacy

## 2014-10-13 ENCOUNTER — Other Ambulatory Visit (INDEPENDENT_AMBULATORY_CARE_PROVIDER_SITE_OTHER): Payer: Medicare Other

## 2014-10-13 DIAGNOSIS — I4891 Unspecified atrial fibrillation: Secondary | ICD-10-CM

## 2014-10-13 DIAGNOSIS — Z23 Encounter for immunization: Secondary | ICD-10-CM

## 2014-10-13 LAB — PROTIME-INR
INR: 2.5 ratio — ABNORMAL HIGH (ref 0.8–1.0)
Prothrombin Time: 27.1 s — ABNORMAL HIGH (ref 9.6–13.1)

## 2014-10-13 NOTE — Addendum Note (Signed)
Addended by: Marchia Bond on: 10/13/2014 10:22 AM   Modules accepted: Orders

## 2014-10-22 ENCOUNTER — Other Ambulatory Visit: Payer: Self-pay | Admitting: Internal Medicine

## 2014-10-24 ENCOUNTER — Other Ambulatory Visit: Payer: Self-pay | Admitting: *Deleted

## 2014-10-24 MED ORDER — ATORVASTATIN CALCIUM 10 MG PO TABS: 10.0000 mg | ORAL_TABLET | Freq: Every day | ORAL | Status: DC

## 2014-10-24 MED ORDER — TAMSULOSIN HCL 0.4 MG PO CAPS: ORAL_CAPSULE | ORAL | Status: DC

## 2014-10-24 MED ORDER — GABAPENTIN 300 MG PO CAPS: 300.0000 mg | ORAL_CAPSULE | Freq: Three times a day (TID) | ORAL | Status: DC

## 2014-10-24 MED ORDER — ALPRAZOLAM 1 MG PO TABS: 1.0000 mg | ORAL_TABLET | Freq: Two times a day (BID) | ORAL | Status: DC | PRN

## 2014-10-24 MED ORDER — FLUTICASONE PROPIONATE 50 MCG/ACT NA SUSP: NASAL | Status: DC

## 2014-10-24 MED ORDER — ATENOLOL 25 MG PO TABS: 25.0000 mg | ORAL_TABLET | Freq: Every day | ORAL | Status: DC

## 2014-10-24 MED ORDER — HYDROCHLOROTHIAZIDE 25 MG PO TABS: 25.0000 mg | ORAL_TABLET | Freq: Every day | ORAL | Status: DC

## 2014-10-24 MED ORDER — WARFARIN SODIUM 5 MG PO TABS: ORAL_TABLET | ORAL | Status: DC

## 2014-10-24 MED ORDER — OMEPRAZOLE 40 MG PO CPDR: 40.0000 mg | DELAYED_RELEASE_CAPSULE | Freq: Every day | ORAL | Status: DC

## 2014-10-24 MED ORDER — MONTELUKAST SODIUM 10 MG PO TABS: 10.0000 mg | ORAL_TABLET | Freq: Every day | ORAL | Status: DC

## 2014-10-24 MED ORDER — AZELASTINE HCL 0.1 % NA SOLN: 2.0000 | Freq: Every day | NASAL | Status: DC

## 2014-10-24 NOTE — Telephone Encounter (Signed)
Last office visit 07/01/2014.  Has Follow up Appointment scheduled 12/31/2014.  Last refilled 10/02/2014 for #60 with no refills.  Please print Rx to fax to Mail Order.

## 2014-10-24 NOTE — Telephone Encounter (Signed)
Rx faxed to requested pharmacy 

## 2014-11-11 ENCOUNTER — Encounter: Payer: Self-pay | Admitting: Cardiovascular Disease

## 2014-11-11 ENCOUNTER — Ambulatory Visit (INDEPENDENT_AMBULATORY_CARE_PROVIDER_SITE_OTHER): Payer: Medicare Other | Admitting: Cardiovascular Disease

## 2014-11-11 VITALS — BP 130/80 | HR 60 | Ht 68.5 in | Wt 185.2 lb

## 2014-11-11 DIAGNOSIS — I10 Essential (primary) hypertension: Secondary | ICD-10-CM

## 2014-11-11 DIAGNOSIS — E785 Hyperlipidemia, unspecified: Secondary | ICD-10-CM

## 2014-11-11 DIAGNOSIS — I4891 Unspecified atrial fibrillation: Secondary | ICD-10-CM

## 2014-11-11 NOTE — Assessment & Plan Note (Signed)
Cholesterol is at goal on the current lipid regimen. No changes to the medications were made.  

## 2014-11-11 NOTE — Assessment & Plan Note (Signed)
Tolerating anticoagulation. We did discuss NOACs. He is not interested at this time Heart rate well controlled. No medication changes made

## 2014-11-11 NOTE — Progress Notes (Signed)
Patient ID: MY MADARIAGA, male    DOB: [birth year], 79 y.o.   MRN: [id number]  HPI Comments: 79 year-old gentleman with chronic atrial fibrillation on warfarin, HTN, remote hx of  DC cardioversion that was unsuccessful,   hyperlipidemia.  hip replacement on the left in April 2012, right knee replacement August 2012. Recent back surgery July 2014 with decompression  previously on Zocor and this was stopped after he had significant weight loss, malaise He presents for routine follow-up of his atrial fibrillation  In general he reports that he is doing well. Denies having any new symptoms. He walks 3 miles per day He continues to have problems with arthritis and takes meloxicam as well as pain medication Denies having any chest pain concerning for angina. Total cholesterol 146  EKG on today's visit shows atrial fibrillation with ventricular rate 60 bpm, no significant ST or T-wave changes   Other past medical history He reports he is doing better since his low back surgery in July 2014. He is walking 30 minutes per day. Recent upper respiratory infection. Took Mucinex D  and it and had malaise, palpitations. Took in one week to get over this feeling. Now feels back to normal.  no complaints on low-dose Lipitor   He denies chest pain, shortness of breath, edema, or palpitations. no lightheadedness or near syncope.     Allergies  Allergen Reactions  . Simvastatin     Listlessness, fatigue  . Mucinex D [Pseudoephedrine-Guaifenesin Er]   . Undecylenic Acid   . Flexeril [Cyclobenzaprine Hcl] Anxiety  . Skelaxin Anxiety    Outpatient Encounter Prescriptions as of 11/11/2014  Medication Sig  . ALPRAZolam (XANAX) 1 MG tablet Take 1 tablet (1 mg total) by mouth 2 (two) times daily as needed.  . Ascorbic Acid (VITAMIN C) 500 MG tablet Take 500 mg by mouth daily.    Marland Kitchen atenolol (TENORMIN) 25 MG tablet Take 1 tablet (25 mg total) by mouth daily.  Marland Kitchen atorvastatin (LIPITOR) 10 MG tablet Take 1  tablet (10 mg total) by mouth daily.  Marland Kitchen azelastine (ASTELIN) 0.1 % nasal spray Place 2 sprays into both nostrils at bedtime.  . [facility] SENNA PLUS 8.6-50 MG per tablet TAKE 2 TABLETS BY MOUTH TWICE A DAY  . fluticasone (FLONASE) 50 MCG/ACT nasal spray USE 2 SPRAYS INTO THE NOSE DAILY  . gabapentin (NEURONTIN) 300 MG capsule Take 1 capsule (300 mg total) by mouth 3 (three) times daily.  . hydrochlorothiazide (HYDRODIURIL) 25 MG tablet Take 1 tablet (25 mg total) by mouth daily.  Marland Kitchen loratadine (CLARITIN) 10 MG tablet Take 1 tablet (10 mg total) by mouth daily.  . meloxicam (MOBIC) 7.5 MG tablet TAKE 1 TABLET BY MOUTH DAILY  . montelukast (SINGULAIR) 10 MG tablet Take 1 tablet (10 mg total) by mouth at bedtime.  . Multiple Vitamin (MULTIVITAMIN) capsule Take 1 capsule by mouth daily.    Marland Kitchen omeprazole (PRILOSEC) 40 MG capsule Take 1 capsule (40 mg total) by mouth daily.  Marland Kitchen oxyCODONE-acetaminophen (PERCOCET/ROXICET) 5-325 MG per tablet Take 1 tablet by mouth 2 (two) times daily as needed.  . Potassium 75 MG TABS Take by mouth daily.    . tamsulosin (FLOMAX) 0.4 MG CAPS capsule TAKE ONE CAPSULE BY MOUTH EVERY DAY, 30 MINUTES AFTER SAME MEAL EACH DAY  . warfarin (COUMADIN) 5 MG tablet TAKE AS DIRECTED BY COUMADIN CLINIC    Past Medical History  Diagnosis Date  . Hyperlipidemia   . Hypertension   . Diverticulitis   .  Atrial fibrillation     chronic persistent, failed DCCV x3 in 2000  . ED (erectile dysfunction)   . Sleep disturbance   . Osteoarthritis   . Lumbar disc disease   . Cervical disc disease   . Impaired fasting glucose   . GERD (gastroesophageal reflux disease)   . BPH (benign prostatic hypertrophy)   . Allergic rhinitis     Past Surgical History  Procedure Laterality Date  . Shoulder surgery  1950's    dislocation  . Knee arthroscopy  06/1998    right Jefm Berisha)  . Hemiarthroplasty shoulder fracture  10/2004    left  . Shoulder surgery  12/2004    right  . Thr--left  4/12     Dr Jefm Glanz  . Joint replacement  4/12    Left hip--Dr Jefm Barbour  . Replacement unicondylar joint knee  8/12    Dr Jefm Burgner  . Colonoscopy  2013  . Lumbar laminectomy  06/10/13    [facility]  . Cataract extraction, bilateral  2014    Social History  reports that he quit smoking about 52 years ago. His smoking use included Cigarettes. He smoked 0.00 packs per day. He has never used smokeless tobacco. He reports that he drinks alcohol. He reports that he does not use illicit drugs.  Family History family history includes Colon polyps in his brother; Stomach cancer in his mother; Stroke in his father. There is no history of Colon cancer.   Review of Systems  Constitutional: Negative.   Respiratory: Negative.   Cardiovascular: Negative.   Gastrointestinal: Negative.   Musculoskeletal: Positive for joint swelling and arthralgias.  Neurological: Negative.   Hematological: Negative.   Psychiatric/Behavioral: Negative.   All other systems reviewed and are negative.   BP 130/80 mmHg  Pulse 60  Ht 5' 8.5" (1.74 m)  Wt 185 lb 4 oz (84.029 kg)  BMI 27.75 kg/m2  Physical Exam  Constitutional: He is oriented to person, place, and time. He appears well-developed and well-nourished.  HENT:  Head: Normocephalic.  Nose: Nose normal.  Mouth/Throat: Oropharynx is clear and moist.  Eyes: Conjunctivae are normal. Pupils are equal, round, and reactive to light.  Neck: Normal range of motion. Neck supple. No JVD present.  Cardiovascular: Normal rate, regular rhythm, S1 normal, S2 normal, normal heart sounds and intact distal pulses.  Exam reveals no gallop and no friction rub.   No murmur heard. Pulmonary/Chest: Effort normal and breath sounds normal. No respiratory distress. He has no wheezes. He has no rales. He exhibits no tenderness.  Abdominal: Soft. Bowel sounds are normal. He exhibits no distension. There is no tenderness.  Musculoskeletal: Normal range of motion. He exhibits no edema or  tenderness.  Lymphadenopathy:    He has no cervical adenopathy.  Neurological: He is alert and oriented to person, place, and time. Coordination normal.  Skin: Skin is warm and dry. No rash noted. No erythema.  Psychiatric: He has a normal mood and affect. His behavior is normal. Judgment and thought content normal.      Assessment and Plan   Nursing note and vitals reviewed.

## 2014-11-11 NOTE — Assessment & Plan Note (Signed)
Blood pressure is well controlled on today's visit. No changes made to the medications. 

## 2014-11-11 NOTE — Patient Instructions (Signed)
You are doing well. No medication changes were made.  Please call us if you have new issues that need to be addressed before your next appt.  Your physician wants you to follow-up in: 12 months.  You will receive a reminder letter in the mail two months in advance. If you don't receive a letter, please call our office to schedule the follow-up appointment. 

## 2014-11-19 ENCOUNTER — Other Ambulatory Visit: Payer: Self-pay

## 2014-11-19 MED ORDER — OXYCODONE-ACETAMINOPHEN 5-325 MG PO TABS: 1.0000 | ORAL_TABLET | Freq: Two times a day (BID) | ORAL | Status: DC | PRN

## 2014-11-19 NOTE — Telephone Encounter (Signed)
Px printed for pick up in IN box  

## 2014-11-19 NOTE — Telephone Encounter (Signed)
Pt left v/m requesting rx oxycodone apap. Call when ready for pick up. Pt last seen 07/01/14.pt has scheduled appt 12/31/2014.Please advise. Dr Silvio Pate is out of office until 11/24/14.

## 2014-11-19 NOTE — Telephone Encounter (Signed)
Pt notified Rx ready for pickup 

## 2014-12-31 ENCOUNTER — Encounter: Payer: Self-pay | Admitting: Internal Medicine

## 2014-12-31 ENCOUNTER — Ambulatory Visit (INDEPENDENT_AMBULATORY_CARE_PROVIDER_SITE_OTHER): Payer: Medicare Other | Admitting: Internal Medicine

## 2014-12-31 VITALS — BP 140/88 | HR 65 | Temp 97.7°F | Ht 68.5 in | Wt 178.0 lb

## 2014-12-31 DIAGNOSIS — Z5181 Encounter for therapeutic drug level monitoring: Secondary | ICD-10-CM

## 2014-12-31 DIAGNOSIS — Z0000 Encounter for general adult medical examination without abnormal findings: Secondary | ICD-10-CM | POA: Diagnosis not present

## 2014-12-31 DIAGNOSIS — Z23 Encounter for immunization: Secondary | ICD-10-CM

## 2014-12-31 DIAGNOSIS — E785 Hyperlipidemia, unspecified: Secondary | ICD-10-CM

## 2014-12-31 DIAGNOSIS — I10 Essential (primary) hypertension: Secondary | ICD-10-CM

## 2014-12-31 DIAGNOSIS — I4819 Other persistent atrial fibrillation: Secondary | ICD-10-CM

## 2014-12-31 DIAGNOSIS — Z7901 Long term (current) use of anticoagulants: Secondary | ICD-10-CM

## 2014-12-31 DIAGNOSIS — Z7189 Other specified counseling: Secondary | ICD-10-CM | POA: Insufficient documentation

## 2014-12-31 DIAGNOSIS — I4891 Unspecified atrial fibrillation: Secondary | ICD-10-CM | POA: Diagnosis not present

## 2014-12-31 DIAGNOSIS — M5136 Other intervertebral disc degeneration, lumbar region: Secondary | ICD-10-CM

## 2014-12-31 DIAGNOSIS — I481 Persistent atrial fibrillation: Secondary | ICD-10-CM

## 2014-12-31 DIAGNOSIS — N400 Enlarged prostate without lower urinary tract symptoms: Secondary | ICD-10-CM

## 2014-12-31 LAB — CBC WITH DIFFERENTIAL/PLATELET
Basophils Absolute: 0 10*3/uL (ref 0.0–0.1)
Basophils Relative: 0.6 % (ref 0.0–3.0)
Eosinophils Absolute: 0.2 10*3/uL (ref 0.0–0.7)
Eosinophils Relative: 3.6 % (ref 0.0–5.0)
HCT: 42.5 % (ref 39.0–52.0)
Hemoglobin: 14.4 g/dL (ref 13.0–17.0)
Lymphocytes Relative: 26.2 % (ref 12.0–46.0)
Lymphs Abs: 1.7 10*3/uL (ref 0.7–4.0)
MCHC: 33.9 g/dL (ref 30.0–36.0)
MCV: 89.5 fl (ref 78.0–100.0)
Monocytes Absolute: 0.6 10*3/uL (ref 0.1–1.0)
Monocytes Relative: 8.6 % (ref 3.0–12.0)
Neutro Abs: 3.9 10*3/uL (ref 1.4–7.7)
Neutrophils Relative %: 61 % (ref 43.0–77.0)
Platelets: 230 10*3/uL (ref 150.0–400.0)
RBC: 4.76 Mil/uL (ref 4.22–5.81)
RDW: 13.2 % (ref 11.5–15.5)
WBC: 6.4 10*3/uL (ref 4.0–10.5)

## 2014-12-31 LAB — COMPREHENSIVE METABOLIC PANEL
ALT: 21 U/L (ref 0–53)
AST: 30 U/L (ref 0–37)
Albumin: 4.3 g/dL (ref 3.5–5.2)
Alkaline Phosphatase: 81 U/L (ref 39–117)
BUN: 17 mg/dL (ref 6–23)
CO2: 30 mEq/L (ref 19–32)
Calcium: 9.4 mg/dL (ref 8.4–10.5)
Chloride: 98 mEq/L (ref 96–112)
Creatinine, Ser: 0.77 mg/dL (ref 0.40–1.50)
GFR: 103.83 mL/min (ref >60.00)
Glucose, Bld: 98 mg/dL (ref 70–99)
Potassium: 3.8 mEq/L (ref 3.5–5.1)
Sodium: 134 mEq/L — ABNORMAL LOW (ref 135–145)
Total Bilirubin: 0.8 mg/dL (ref 0.2–1.2)
Total Protein: 7.1 g/dL (ref 6.0–8.3)

## 2014-12-31 LAB — LIPID PANEL
Cholesterol: 126 mg/dL (ref 0–200)
HDL: 46.4 mg/dL (ref >39.00)
LDL Cholesterol: 66 mg/dL (ref 0–99)
NonHDL: 79.6
Total CHOL/HDL Ratio: 3
Triglycerides: 66 mg/dL (ref 0.0–149.0)
VLDL: 13.2 mg/dL (ref 0.0–40.0)

## 2014-12-31 LAB — POCT INR: INR: 1.9

## 2014-12-31 LAB — T4, FREE: Free T4: 0.78 ng/dL (ref 0.60–1.60)

## 2014-12-31 MED ORDER — OXYCODONE-ACETAMINOPHEN 5-325 MG PO TABS: 1.0000 | ORAL_TABLET | Freq: Two times a day (BID) | ORAL | Status: DC | PRN

## 2014-12-31 NOTE — Assessment & Plan Note (Signed)
Still limiting but tries to walk as much as he can Uses the oxycodone and other meds regularly

## 2014-12-31 NOTE — Progress Notes (Signed)
Subjective:    Patient ID: Mark Padilla, male    DOB: September 18, 1935, 80 y.o.   MRN: [id number]  HPI Here for Medicare wellness visit and follow up of multiple medical issues Reviewed advanced directives Sees Dr Rockey Situ for heart, hasn't seen Dr Delilah Shan at [facility] (back doctor) since surgery. Sees [facility] in [place] No surgery or hospitalizations in the past year Rare alcohol and no tobacco Has fallen twice in past year--twisted ankle Mood has been okay--had been living with daughter and now back in his own place. Was limited while there so feels better now. Not anhedonic Does all instrumental ADLs Does regular walking--- this is what he can do with his back Vision is fair Some hearing loss on left--gets along okay Reviewed meds Mild memory issues---he feels they are normal for age  No heart problems No palpitations at all No chest pain or SOB No dizziness or syncope No edema  Voids fiar Stream slightly weak after awhile--- has to take a while Nocturia is rare  Ongoing back pain This is still very limiting to him Uses the meds regularly--but still has breakthrough, especially if he overdoes it  Uses the alprazolam just at bedtime to help sleep Will rarely use one in day--if he gets stressed out  Stomach is fine on omeprazole No heartburn No dysphagia  Current Outpatient Prescriptions on File Prior to Visit  Medication Sig Dispense Refill  . ALPRAZolam (XANAX) 1 MG tablet Take 1 tablet (1 mg total) by mouth 2 (two) times daily as needed. 180 tablet 0  . Ascorbic Acid (VITAMIN C) 500 MG tablet Take 500 mg by mouth daily.      Marland Kitchen atenolol (TENORMIN) 25 MG tablet Take 1 tablet (25 mg total) by mouth daily. 90 tablet 3  . atorvastatin (LIPITOR) 10 MG tablet Take 1 tablet (10 mg total) by mouth daily. 90 tablet 3  . azelastine (ASTELIN) 0.1 % nasal spray Place 2 sprays into both nostrils at bedtime. 90 mL 3  . [facility]S SENNA PLUS 8.6-50 MG per tablet TAKE 2 TABLETS BY MOUTH  TWICE A DAY 180 tablet 7  . fluticasone (FLONASE) 50 MCG/ACT nasal spray USE 2 SPRAYS INTO THE NOSE DAILY 48 g 3  . gabapentin (NEURONTIN) 300 MG capsule Take 1 capsule (300 mg total) by mouth 3 (three) times daily. 90 capsule 3  . hydrochlorothiazide (HYDRODIURIL) 25 MG tablet Take 1 tablet (25 mg total) by mouth daily. 90 tablet 3  . loratadine (CLARITIN) 10 MG tablet Take 1 tablet (10 mg total) by mouth daily. 90 tablet 0  . meloxicam (MOBIC) 7.5 MG tablet TAKE 1 TABLET BY MOUTH DAILY 90 tablet 0  . montelukast (SINGULAIR) 10 MG tablet Take 1 tablet (10 mg total) by mouth at bedtime. 90 tablet 3  . Multiple Vitamin (MULTIVITAMIN) capsule Take 1 capsule by mouth daily.      Marland Kitchen omeprazole (PRILOSEC) 40 MG capsule Take 1 capsule (40 mg total) by mouth daily. 90 capsule 3  . oxyCODONE-acetaminophen (PERCOCET/ROXICET) 5-325 MG per tablet Take 1 tablet by mouth 2 (two) times daily as needed. 60 tablet 0  . Potassium 75 MG TABS Take by mouth daily.      . tamsulosin (FLOMAX) 0.4 MG CAPS capsule TAKE ONE CAPSULE BY MOUTH EVERY DAY, 30 MINUTES AFTER SAME MEAL EACH DAY 90 capsule 3  . warfarin (COUMADIN) 5 MG tablet TAKE AS DIRECTED BY COUMADIN CLINIC 90 tablet 0   No current facility-administered medications on file prior  to visit.    Allergies  Allergen Reactions  . Simvastatin     Listlessness, fatigue  . Mucinex D [Pseudoephedrine-Guaifenesin Er]   . Undecylenic Acid   . Flexeril [Cyclobenzaprine Hcl] Anxiety  . Skelaxin Anxiety    Past Medical History  Diagnosis Date  . Hyperlipidemia   . Hypertension   . Diverticulitis   . Atrial fibrillation     chronic persistent, failed DCCV x3 in 2000  . ED (erectile dysfunction)   . Sleep disturbance   . Osteoarthritis   . Lumbar disc disease   . Cervical disc disease   . Impaired fasting glucose   . GERD (gastroesophageal reflux disease)   . BPH (benign prostatic hypertrophy)   . Allergic rhinitis     Past Surgical History  Procedure  Laterality Date  . Shoulder surgery  1950's    dislocation  . Knee arthroscopy  06/1998    right Jefm Sui)  . Hemiarthroplasty shoulder fracture  10/2004    left  . Shoulder surgery  12/2004    right  . Thr--left  4/12    Dr Jefm Maqueda  . Joint replacement  4/12    Left hip--Dr Jefm Santoyo  . Replacement unicondylar joint knee  8/12    Dr Jefm Blumenstock  . Colonoscopy  2013  . Lumbar laminectomy  06/10/13    [facility]  . Cataract extraction, bilateral  2014    Family History  Problem Relation Age of Onset  . Stomach cancer Mother   . Stroke Father   . Colon polyps Brother   . Colon cancer Neg Hx     History   Social History  . Marital Status: Divorced    Spouse Name: N/A  . Number of Children: 2  . Years of Education: N/A   Occupational History  . retired, [employer] works part time [place]  . Smoking status: Former Smoker    Types: Cigarettes    Quit date: 11/21/1962  . Smokeless tobacco: Never Used  . Alcohol Use: 0.0 oz/week    0 Standard drinks or equivalent per week     Comment: occasional  . Drug Use: No  . Sexual Activity: Not on file   Other Topics Concern  . Not on file   Social History Narrative   Plans to do living will   Will designate daughter [name] as health care POA.    Would accept resuscitation but no prolonged artificial ventilation   Would not want tube feeds if cognitively unaware   Review of Systems Full dentures--hasn't been seeing dentist Sleeps well Bowels are fine Skin dry in winter. No rash  Does have knot on the back of his head---gets rubbed when he wears tie Wears seat belt    Objective:   Physical Exam  Constitutional: He is oriented to person, place, and time. He appears well-developed and well-nourished. No distress.  HENT:  Mouth/Throat: Oropharynx is clear and moist. No oropharyngeal exudate.  Neck: Normal range of motion. Neck supple. No thyromegaly present.  Cardiovascular: Normal  rate.  Exam reveals no gallop.   No murmur heard. irregular  Pulmonary/Chest: Effort normal and breath sounds normal. No respiratory distress. He has no wheezes. He has no rales.  Abdominal: Soft. There is no tenderness.  Musculoskeletal: He exhibits no edema or tenderness.  Lymphadenopathy:    He has no cervical adenopathy.  Neurological: He is alert and oriented to person, place, and time.  President-- "Obama, Clinton, (then) ?" [phone]  [id number] Recall 2/3  Skin: No rash noted. No erythema.  Psychiatric: He has a normal mood and affect. His behavior is normal.          Assessment & Plan:

## 2014-12-31 NOTE — Assessment & Plan Note (Signed)
I have personally reviewed the Medicare Annual Wellness questionnaire and have noted 1. The patient's medical and social history 2. Their use of alcohol, tobacco or illicit drugs 3. Their current medications and supplements 4. The patient's functional ability including ADL's, fall risks, home safety risks and hearing or visual             impairment. 5. Diet and physical activities 6. Evidence for depression or mood disorders  The patients weight, height, BMI and visual acuity have been recorded in the chart I have made referrals, counseling and provided education to the patient based review of the above and I have provided the pt with a written personalized care plan for preventive services.  I have provided you with a copy of your personalized plan for preventive services. Please take the time to review along with your updated medication list.  prevnar today No cancer screening due to age

## 2014-12-31 NOTE — Assessment & Plan Note (Signed)
Voiding okay Continue the tamsulosin

## 2014-12-31 NOTE — Assessment & Plan Note (Signed)
Tolerating statin Will continue primary prevention

## 2014-12-31 NOTE — Addendum Note (Signed)
Addended by: Ellamae Sia on: 12/31/2014 11:44 AM   Modules accepted: Orders

## 2014-12-31 NOTE — Assessment & Plan Note (Signed)
See social history Blank forms given 

## 2014-12-31 NOTE — Assessment & Plan Note (Signed)
Good rate control On coumadin 

## 2014-12-31 NOTE — Assessment & Plan Note (Signed)
BP Readings from Last 3 Encounters:  12/31/14 140/88  11/11/14 130/80  07/01/14 110/80   Good control  No med changes needed

## 2014-12-31 NOTE — Addendum Note (Signed)
Addended by: Lurlean Nanny on: 12/31/2014 11:29 AM   Modules accepted: Orders

## 2014-12-31 NOTE — Progress Notes (Signed)
Pre visit review using our clinic review tool, if applicable. No additional management support is needed unless otherwise documented below in the visit note. 

## 2015-02-09 ENCOUNTER — Other Ambulatory Visit: Payer: Self-pay | Admitting: Internal Medicine

## 2015-02-12 ENCOUNTER — Other Ambulatory Visit: Payer: Self-pay | Admitting: *Deleted

## 2015-02-12 NOTE — Telephone Encounter (Signed)
10/24/2014  Form on your desk to be faxed back to [facility]

## 2015-02-16 MED ORDER — ALPRAZOLAM 1 MG PO TABS: 1.0000 mg | ORAL_TABLET | Freq: Two times a day (BID) | ORAL | Status: DC | PRN

## 2015-02-16 NOTE — Telephone Encounter (Signed)
Approved:  #[id number] x 0 

## 2015-02-16 NOTE — Telephone Encounter (Signed)
rx faxed to pharmacy manually, and scanned

## 2015-02-18 ENCOUNTER — Ambulatory Visit (INDEPENDENT_AMBULATORY_CARE_PROVIDER_SITE_OTHER): Payer: Medicare Other | Admitting: Internal Medicine

## 2015-02-18 ENCOUNTER — Encounter: Payer: Self-pay | Admitting: Internal Medicine

## 2015-02-18 VITALS — BP 120/70 | HR 75 | Temp 97.9°F | Wt 175.0 lb

## 2015-02-18 DIAGNOSIS — I4891 Unspecified atrial fibrillation: Secondary | ICD-10-CM | POA: Diagnosis not present

## 2015-02-18 DIAGNOSIS — D234 Other benign neoplasm of skin of scalp and neck: Secondary | ICD-10-CM | POA: Insufficient documentation

## 2015-02-18 DIAGNOSIS — Z7901 Long term (current) use of anticoagulants: Secondary | ICD-10-CM

## 2015-02-18 DIAGNOSIS — Z5181 Encounter for therapeutic drug level monitoring: Secondary | ICD-10-CM

## 2015-02-18 LAB — POCT INR: INR: 1.9

## 2015-02-18 NOTE — Assessment & Plan Note (Signed)
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

## 2015-02-18 NOTE — Progress Notes (Signed)
Pre visit review using our clinic review tool, if applicable. No additional management support is needed unless otherwise documented below in the visit note. 

## 2015-02-18 NOTE — Addendum Note (Signed)
Addended by: Ellamae Sia on: 02/18/2015 02:55 PM   Modules accepted: Orders

## 2015-02-18 NOTE — Progress Notes (Signed)
   Subjective:    Patient ID: Mark Padilla, male    DOB: 1934-12-23, 80 y.o.   MRN: [id number]  HPI Here for removal of irritating and irritated lesion of posterior neck   Review of Systems     Objective:   Physical Exam        Assessment & Plan:

## 2015-02-19 ENCOUNTER — Telehealth: Payer: Self-pay | Admitting: Internal Medicine

## 2015-02-19 NOTE — Telephone Encounter (Signed)
-----   Message from Venia Carbon, MD sent at 02/18/2015  3:23 PM EDT ----- No need to change med Recheck 1 month

## 2015-02-19 NOTE — Telephone Encounter (Signed)
Left message for patient informing him no changes in medication and to follow up in 1 month.

## 2015-02-19 NOTE — Telephone Encounter (Signed)
Pt returned your call. Please call after 2pm as pt is at a funeral. Thanks.

## 2015-03-05 ENCOUNTER — Other Ambulatory Visit: Payer: Self-pay

## 2015-03-05 MED ORDER — OXYCODONE-ACETAMINOPHEN 5-325 MG PO TABS: 1.0000 | ORAL_TABLET | Freq: Two times a day (BID) | ORAL | Status: DC | PRN

## 2015-03-05 NOTE — Telephone Encounter (Signed)
Spoke with patient and advised rx ready for pick-up and it will be at the front desk.  

## 2015-03-05 NOTE — Telephone Encounter (Signed)
Pt left v/m requesting rx oxycodone apap. Call when ready for pick up today. [place] beach [place] early [place] one week and request cb today. Last annual 12/31/14.

## 2015-04-03 ENCOUNTER — Other Ambulatory Visit: Payer: Self-pay

## 2015-04-03 NOTE — Telephone Encounter (Addendum)
Pt left v/m pt is out of mobic and all joints aching; pt request refill [facility] to [facility]. Pt last seen 12/31/14 annual and mobic last refilled # 90 x 0 on 10/22/2014.Please advise.Asked Ddr Glori Bickers and she said if No elevation of creatinine and no GI Bleeding to call in small quantity locally until Dr Silvio Pate can review on 04/06/15; pt does not want to send to local pharmacy for smaller quantity until can get mail order refill done and pt wants to have 3 month rx to [facility] and pt said he has been out of med for one month and can wait for Dr Alla German return on 04/06/15. Pt request cb when refilled. Pt is going out of town on 04/07/15.

## 2015-04-04 NOTE — Telephone Encounter (Signed)
Approved: okay to refill for a year 

## 2015-04-06 MED ORDER — MELOXICAM 7.5 MG PO TABS: 7.5000 mg | ORAL_TABLET | Freq: Every day | ORAL | Status: DC

## 2015-04-06 NOTE — Telephone Encounter (Signed)
rx sent to pharmacy by e-script  

## 2015-04-08 NOTE — Telephone Encounter (Signed)
Pt requesting status of mobic refill; advised sent electronically to [facility] on 04/06/15. Pt appreciative.

## 2015-04-29 ENCOUNTER — Other Ambulatory Visit: Payer: Self-pay

## 2015-04-29 MED ORDER — OXYCODONE-ACETAMINOPHEN 5-325 MG PO TABS: 1.0000 | ORAL_TABLET | Freq: Two times a day (BID) | ORAL | Status: DC | PRN

## 2015-04-29 NOTE — Telephone Encounter (Signed)
Pt left v/m requesting oxycodone apap rx. Call when ready for pick up. Last annual exam 12/31/2014 and rx last printed # 60 on 03/05/15.

## 2015-04-29 NOTE — Telephone Encounter (Signed)
Spoke with patient and advised rx ready for pick-up and it will be at the front desk.  

## 2015-04-30 ENCOUNTER — Other Ambulatory Visit (INDEPENDENT_AMBULATORY_CARE_PROVIDER_SITE_OTHER): Payer: Medicare Other

## 2015-04-30 DIAGNOSIS — I4891 Unspecified atrial fibrillation: Secondary | ICD-10-CM | POA: Diagnosis not present

## 2015-04-30 LAB — POCT INR: INR: 2

## 2015-06-11 ENCOUNTER — Ambulatory Visit (INDEPENDENT_AMBULATORY_CARE_PROVIDER_SITE_OTHER): Payer: Medicare Other | Admitting: *Deleted

## 2015-06-11 DIAGNOSIS — Z5181 Encounter for therapeutic drug level monitoring: Secondary | ICD-10-CM

## 2015-06-11 DIAGNOSIS — I481 Persistent atrial fibrillation: Secondary | ICD-10-CM | POA: Diagnosis not present

## 2015-06-11 DIAGNOSIS — I4819 Other persistent atrial fibrillation: Secondary | ICD-10-CM

## 2015-06-11 LAB — POCT INR: INR: 2.2

## 2015-06-11 NOTE — Progress Notes (Signed)
Pre visit review using our clinic review tool, if applicable. No additional management support is needed unless otherwise documented below in the visit note. 

## 2015-06-29 ENCOUNTER — Ambulatory Visit (INDEPENDENT_AMBULATORY_CARE_PROVIDER_SITE_OTHER): Payer: Medicare Other | Admitting: Family Medicine

## 2015-06-29 ENCOUNTER — Encounter: Payer: Self-pay | Admitting: Family Medicine

## 2015-06-29 VITALS — BP 130/80 | HR 74 | Temp 98.4°F | Wt 182.0 lb

## 2015-06-29 DIAGNOSIS — J189 Pneumonia, unspecified organism: Secondary | ICD-10-CM

## 2015-06-29 DIAGNOSIS — I4891 Unspecified atrial fibrillation: Secondary | ICD-10-CM

## 2015-06-29 MED ORDER — OXYCODONE-ACETAMINOPHEN 5-325 MG PO TABS: 1.0000 | ORAL_TABLET | Freq: Two times a day (BID) | ORAL | Status: DC | PRN

## 2015-06-29 MED ORDER — DOXYCYCLINE HYCLATE 100 MG PO TABS: 100.0000 mg | ORAL_TABLET | Freq: Two times a day (BID) | ORAL | Status: DC

## 2015-06-29 NOTE — Progress Notes (Signed)
Pre visit review using our clinic review tool, if applicable. No additional management support is needed unless otherwise documented below in the visit note. 

## 2015-06-29 NOTE — Progress Notes (Signed)
Dr. Frederico Hamman T. Jaanai Salemi, MD, [facility] [place], [address] Phone: [phone] Fax: [phone]  06/29/2015  Patient: Mark Padilla, MRN: [id number], DOB: March 07, 1935, 80 y.o.  Primary Physician:  Viviana Simpler, MD  Chief Complaint: Cough  Subjective:   Mark Padilla is a 80 y.o. very pleasant male patient who presents with the following:  Aching all over.  Has run a fever - felt hot and sweaty and sweating.  Has been feeling bad like that.  Sick for about 4-5 days Feels miserable.  He feels like he is been running a temperature, but he does not have a thermometer at home.  He has been sweating a lot and he is generally hurting and not feeling well at all.  Past Medical History, Surgical History, Social History, Family History, Problem List, Medications, and Allergies have been reviewed and updated if relevant.  Patient Active Problem List   Diagnosis Date Noted  . Dermatofibroma of neck 02/18/2015  . Advance directive discussed with patient 12/31/2014  . Encounter for therapeutic drug monitoring 01/13/2014  . Actinic keratosis 12/04/2012  . BPH (benign prostatic hypertrophy)   . Routine general medical examination at a health care facility 04/28/2011  . Osteoarthrosis involving more than one site 02/18/2011  . HEADACHE 11/17/2010  . Degenerative disc disease, lumbar 05/04/2010  . SLEEP DISORDER 05/05/2009  . Hyperlipidemia 02/19/2007  . Essential hypertension 02/19/2007  . Atrial fibrillation 02/19/2007  . ALLERGIC RHINITIS 02/19/2007  . DIVERTICULOSIS, COLON 02/19/2007  . ERECTILE DYSFUNCTION, 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    Past Medical History  Diagnosis Date  . Hyperlipidemia   . Hypertension   . Diverticulitis   . Atrial fibrillation     chronic persistent, failed DCCV x3 in 2000  . ED (erectile dysfunction)   . Sleep disturbance   . Osteoarthritis   . Lumbar disc disease   . Cervical disc disease     . Impaired fasting glucose   . GERD (gastroesophageal reflux disease)   . BPH (benign prostatic hypertrophy)   . Allergic rhinitis     Past Surgical History  Procedure Laterality Date  . Shoulder surgery  1950's    dislocation  . Knee arthroscopy  06/1998    right Jefm Oravec)  . Hemiarthroplasty shoulder fracture  10/2004    left  . Shoulder surgery  12/2004    right  . Thr--left  4/12    Dr Jefm Mccalip  . Joint replacement  4/12    Left hip--Dr Jefm Nelson  . Replacement unicondylar joint knee  8/12    Dr Jefm Vallely  . Colonoscopy  2013  . Lumbar laminectomy  06/10/13    [facility]  . Cataract extraction, bilateral  2014    History   Social History  . Marital Status: Divorced    Spouse Name: N/A  . Number of Children: 2  . Years of Education: N/A   Occupational History  . retired, [employer] works part time [place]  . Smoking status: Former Smoker    Types: Cigarettes    Quit date: 11/21/1962  . Smokeless tobacco: Never Used  . Alcohol Use: 0.0 oz/week    0 Standard drinks or equivalent per week     Comment: occasional  . Drug Use: No  . Sexual Activity: Not on file   Other Topics Concern  . Not on file   Social History Narrative   Plans to do living will  Will designate daughter [name] as health care POA.    Would accept resuscitation but no prolonged artificial ventilation   Would not want tube feeds if cognitively unaware    Family History  Problem Relation Age of Onset  . Stomach cancer Mother   . Stroke Father   . Colon polyps Brother   . Colon cancer Neg Hx     Allergies  Allergen Reactions  . Simvastatin     Listlessness, fatigue  . Mucinex D [Pseudoephedrine-Guaifenesin Er]   . Undecylenic Acid   . Flexeril [Cyclobenzaprine Hcl] Anxiety  . Skelaxin Anxiety    Medication list reviewed and updated in full in [facility].  ROS: GEN: Acute illness details above GI: Tolerating PO intake GU:  maintaining adequate hydration and urination Pulm: No SOB Interactive and getting along well at home.  Otherwise, ROS is as per the HPI.   Objective:   BP 130/80 mmHg  Pulse 74  Temp(Src) 98.4 F (36.9 C) (Oral)  Wt 182 lb (82.555 kg)  SpO2 97%   GEN: A and O x 3. WDWN. NAD.    ENT: Nose clear, ext NML.  No LAD.  No JVD.  TM's clear. Oropharynx clear.  PULM: Normal WOB, no distress. Left greater than right lung fields contain scattered rhonchorous sounds, more in the left.  There is also some rare wheezing. CV: RRR, no M/G/R, No rubs, No JVD.   EXT: warm and well-perfused, No c/c/e. PSYCH: Pleasant and conversant.    Laboratory and Imaging Data:  Assessment and Plan:   Walking pneumonia  Atrial fibrillation, unspecified  Atypical pneumonia versus possibly even left lobar pneumonia given ulnar exam.  He is afebrile in the office, and has 97% pulse ox.  At 80 years old, I'm going to cover him with some doxycycline.  Plain guaifenesin p.r.n.  New Prescriptions   DOXYCYCLINE (VIBRA-TABS) 100 MG TABLET    Take 1 tablet (100 mg total) by mouth 2 (two) times daily.   No orders of the defined types were placed in this encounter.    Signed,  Maud Deed. Ida Uppal, MD   Patient's Medications  New Prescriptions   DOXYCYCLINE (VIBRA-TABS) 100 MG TABLET    Take 1 tablet (100 mg total) by mouth 2 (two) times daily.  Previous Medications   ALPRAZOLAM (XANAX) 1 MG TABLET    Take 1 tablet (1 mg total) by mouth 2 (two) times daily as needed.   ASCORBIC ACID (VITAMIN C) 500 MG TABLET    Take 500 mg by mouth daily.     ATENOLOL (TENORMIN) 25 MG TABLET    Take 1 tablet (25 mg total) by mouth daily.   ATORVASTATIN (LIPITOR) 10 MG TABLET    Take 1 tablet (10 mg total) by mouth daily.   AZELASTINE (ASTELIN) 0.1 % NASAL SPRAY    Place 2 sprays into both nostrils at bedtime.   [employer] SENNA PLUS 8.6-50 MG PER TABLET    TAKE 2 TABLETS BY MOUTH TWICE A DAY   FLUTICASONE (FLONASE) 50 MCG/ACT NASAL  SPRAY    USE 2 SPRAYS INTO THE NOSE DAILY   GABAPENTIN (NEURONTIN) 300 MG CAPSULE    Take 1 capsule by mouth 3  times daily   HYDROCHLOROTHIAZIDE (HYDRODIURIL) 25 MG TABLET    Take 1 tablet (25 mg total) by mouth daily.   LORATADINE (CLARITIN) 10 MG TABLET    Take 1 tablet (10 mg total) by mouth daily.   MELOXICAM (MOBIC) 7.5 MG TABLET  Take 1 tablet (7.5 mg total) by mouth daily.   MONTELUKAST (SINGULAIR) 10 MG TABLET    Take 1 tablet (10 mg total) by mouth at bedtime.   MULTIPLE VITAMIN (MULTIVITAMIN) CAPSULE    Take 1 capsule by mouth daily.     OMEPRAZOLE (PRILOSEC) 40 MG CAPSULE    Take 1 capsule (40 mg total) by mouth daily.   POTASSIUM 75 MG TABS    Take by mouth daily.     TAMSULOSIN (FLOMAX) 0.4 MG CAPS CAPSULE    TAKE ONE CAPSULE BY MOUTH EVERY DAY, 30 MINUTES AFTER SAME MEAL EACH DAY   WARFARIN (COUMADIN) 5 MG TABLET    TAKE AS DIRECTED BY  COUMADIN CLINIC  Modified Medications   Modified Medication Previous Medication   OXYCODONE-ACETAMINOPHEN (PERCOCET/ROXICET) 5-325 MG PER TABLET oxyCODONE-acetaminophen (PERCOCET/ROXICET) 5-325 MG per tablet      Take 1 tablet by mouth 2 (two) times daily as needed.    Take 1 tablet by mouth 2 (two) times daily as needed.  Discontinued Medications   No medications on file

## 2015-07-23 ENCOUNTER — Ambulatory Visit: Payer: Medicare Other

## 2015-07-30 ENCOUNTER — Ambulatory Visit (INDEPENDENT_AMBULATORY_CARE_PROVIDER_SITE_OTHER): Payer: Medicare Other | Admitting: *Deleted

## 2015-07-30 DIAGNOSIS — I4891 Unspecified atrial fibrillation: Secondary | ICD-10-CM | POA: Diagnosis not present

## 2015-07-30 DIAGNOSIS — Z5181 Encounter for therapeutic drug level monitoring: Secondary | ICD-10-CM | POA: Diagnosis not present

## 2015-07-30 LAB — POCT INR: INR: 2.8

## 2015-07-30 NOTE — Progress Notes (Signed)
Pre visit review using our clinic review tool, if applicable. No additional management support is needed unless otherwise documented below in the visit note. 

## 2015-08-25 ENCOUNTER — Other Ambulatory Visit: Payer: Self-pay

## 2015-08-25 NOTE — Telephone Encounter (Signed)
Pt left v/m requesting rx oxycodone apap. Call when ready for pick up. rx last printed # 60 on 06/29/15. Last annual exam on 12/31/14.

## 2015-08-26 MED ORDER — OXYCODONE-ACETAMINOPHEN 5-325 MG PO TABS: 1.0000 | ORAL_TABLET | Freq: Two times a day (BID) | ORAL | Status: DC | PRN

## 2015-08-26 NOTE — Telephone Encounter (Signed)
Spoke with patient and advised rx ready for pick-up and it will be at the front desk.  

## 2015-09-03 ENCOUNTER — Other Ambulatory Visit (INDEPENDENT_AMBULATORY_CARE_PROVIDER_SITE_OTHER): Payer: Medicare Other

## 2015-09-03 ENCOUNTER — Ambulatory Visit (INDEPENDENT_AMBULATORY_CARE_PROVIDER_SITE_OTHER): Payer: Medicare Other | Admitting: *Deleted

## 2015-09-03 ENCOUNTER — Ambulatory Visit: Payer: Medicare Other | Admitting: *Deleted

## 2015-09-03 DIAGNOSIS — Z5181 Encounter for therapeutic drug level monitoring: Secondary | ICD-10-CM

## 2015-09-03 DIAGNOSIS — I4891 Unspecified atrial fibrillation: Secondary | ICD-10-CM

## 2015-09-03 LAB — POCT INR: INR: 2.2

## 2015-09-03 NOTE — Progress Notes (Signed)
Pre visit review using our clinic review tool, if applicable. No additional management support is needed unless otherwise documented below in the visit note. 

## 2015-09-07 ENCOUNTER — Ambulatory Visit: Payer: Medicare Other

## 2015-09-22 ENCOUNTER — Ambulatory Visit (INDEPENDENT_AMBULATORY_CARE_PROVIDER_SITE_OTHER): Payer: Medicare Other

## 2015-09-22 DIAGNOSIS — Z23 Encounter for immunization: Secondary | ICD-10-CM | POA: Diagnosis not present

## 2015-09-24 ENCOUNTER — Other Ambulatory Visit: Payer: Self-pay | Admitting: Internal Medicine

## 2015-09-25 ENCOUNTER — Other Ambulatory Visit: Payer: Self-pay | Admitting: *Deleted

## 2015-09-25 NOTE — Telephone Encounter (Signed)
Last filled #180 02/16/15

## 2015-09-26 NOTE — Telephone Encounter (Signed)
Approved:  #[id number] x 0 

## 2015-09-28 MED ORDER — ALPRAZOLAM 1 MG PO TABS: 1.0000 mg | ORAL_TABLET | Freq: Two times a day (BID) | ORAL | Status: DC | PRN

## 2015-09-28 NOTE — Telephone Encounter (Signed)
rx faxed to pharmacy manually Spoke with patient and advised we received from [facility]

## 2015-09-28 NOTE — Telephone Encounter (Signed)
Spoke to patient and advised we do not call in to mail order pharmacies and that he would need to pick-up

## 2015-10-06 DIAGNOSIS — H3530 Unspecified macular degeneration: Secondary | ICD-10-CM | POA: Diagnosis not present

## 2015-10-06 DIAGNOSIS — H524 Presbyopia: Secondary | ICD-10-CM | POA: Diagnosis not present

## 2015-10-06 DIAGNOSIS — H5203 Hypermetropia, bilateral: Secondary | ICD-10-CM | POA: Diagnosis not present

## 2015-10-06 DIAGNOSIS — H52223 Regular astigmatism, bilateral: Secondary | ICD-10-CM | POA: Diagnosis not present

## 2015-10-12 ENCOUNTER — Ambulatory Visit: Payer: Medicare Other

## 2015-10-13 ENCOUNTER — Ambulatory Visit (INDEPENDENT_AMBULATORY_CARE_PROVIDER_SITE_OTHER): Payer: Medicare Other | Admitting: *Deleted

## 2015-10-13 DIAGNOSIS — Z5181 Encounter for therapeutic drug level monitoring: Secondary | ICD-10-CM | POA: Diagnosis not present

## 2015-10-13 DIAGNOSIS — I4891 Unspecified atrial fibrillation: Secondary | ICD-10-CM | POA: Diagnosis not present

## 2015-10-13 LAB — POCT INR: INR: 2

## 2015-10-13 NOTE — Progress Notes (Signed)
Pre visit review using our clinic review tool, if applicable. No additional management support is needed unless otherwise documented below in the visit note. 

## 2015-10-29 ENCOUNTER — Other Ambulatory Visit: Payer: Self-pay

## 2015-10-29 NOTE — Telephone Encounter (Signed)
Pt left /vm requesting rx oxycodone apap call when ready for pick up. Last printed # 60 on 08/26/15. Last annual exam on 12/31/14.

## 2015-10-30 MED ORDER — OXYCODONE-ACETAMINOPHEN 5-325 MG PO TABS: 1.0000 | ORAL_TABLET | Freq: Two times a day (BID) | ORAL | Status: DC | PRN

## 2015-10-30 NOTE — Telephone Encounter (Signed)
Left message on machine that rx is ready for pick-up, and it will be at our front desk.  

## 2015-11-10 ENCOUNTER — Ambulatory Visit (INDEPENDENT_AMBULATORY_CARE_PROVIDER_SITE_OTHER): Payer: Medicare Other | Admitting: Cardiovascular Disease

## 2015-11-10 ENCOUNTER — Encounter: Payer: Self-pay | Admitting: Cardiovascular Disease

## 2015-11-10 VITALS — BP 122/70 | HR 56 | Ht 67.0 in | Wt 185.5 lb

## 2015-11-10 DIAGNOSIS — E785 Hyperlipidemia, unspecified: Secondary | ICD-10-CM | POA: Diagnosis not present

## 2015-11-10 DIAGNOSIS — I4891 Unspecified atrial fibrillation: Secondary | ICD-10-CM

## 2015-11-10 DIAGNOSIS — I10 Essential (primary) hypertension: Secondary | ICD-10-CM

## 2015-11-10 NOTE — Assessment & Plan Note (Signed)
Blood pressure is well controlled on today's visit. No changes made to the medications. 

## 2015-11-10 NOTE — Progress Notes (Signed)
Patient ID: Mark Padilla, male    DOB: Apr 28, 1935, 80 y.o.   MRN: [id number]  HPI Comments: 80 year-old gentleman with chronic atrial fibrillation on warfarin, HTN, remote hx of  DC cardioversion that was unsuccessful, hyperlipidemia, hip replacement on the left in April 2012, right knee replacement August 2012. Recent back surgery July 2014 with decompression  previously on Zocor and this was stopped after he had significant weight loss, malaise He presents for routine follow-up of his atrial fibrillation  In follow-up, he reports that he is doing very well He walks 3 miles per day on average, Total cholesterol 126, LDL 66 Other lab work shows sodium 134 in February 2016 Denies any chest pain concerning for angina.  EKG on today's visit shows atrial fibrillation with ventricular rate 53 up to 56 bpm, no significant ST or T-wave changes   Other past medical history He reports he is doing better since his low back surgery in July 2014. He is walking 30 minutes per day. Recent upper respiratory infection. Took Mucinex D  and it and had malaise, palpitations. Took in one week to get over this feeling. Now feels back to normal.  no complaints on low-dose Lipitor   He denies chest pain, shortness of breath, edema, or palpitations. no lightheadedness or near syncope.     Allergies  Allergen Reactions  . Simvastatin     Listlessness, fatigue  . Mucinex D [Pseudoephedrine-Guaifenesin Er]   . Undecylenic Acid   . Flexeril [Cyclobenzaprine Hcl] Anxiety  . Skelaxin Anxiety    Outpatient Encounter Prescriptions as of 11/10/2015  Medication Sig  . ALPRAZolam (XANAX) 1 MG tablet Take 1 tablet (1 mg total) by mouth 2 (two) times daily as needed.  . Ascorbic Acid (VITAMIN C) 500 MG tablet Take 500 mg by mouth daily.    Marland Kitchen atenolol (TENORMIN) 25 MG tablet Take 1 tablet (25 mg total) by mouth daily.  Marland Kitchen atorvastatin (LIPITOR) 10 MG tablet Take 1 tablet (10 mg total) by mouth daily.  Marland Kitchen  azelastine (ASTELIN) 0.1 % nasal spray Place 2 sprays into both nostrils at bedtime.  . CVS SENNA PLUS 8.6-50 MG per tablet TAKE 2 TABLETS BY MOUTH TWICE A DAY  . fluticasone (FLONASE) 50 MCG/ACT nasal spray Use 2 sprays nasally daily  . gabapentin (NEURONTIN) 300 MG capsule Take 1 capsule by mouth 3  times daily  . hydrochlorothiazide (HYDRODIURIL) 25 MG tablet Take 1 tablet by mouth  daily  . loratadine (CLARITIN) 10 MG tablet Take 1 tablet (10 mg total) by mouth daily.  . meloxicam (MOBIC) 7.5 MG tablet Take 1 tablet (7.5 mg total) by mouth daily.  . montelukast (SINGULAIR) 10 MG tablet Take 1 tablet by mouth at  bedtime  . Multiple Vitamin (MULTIVITAMIN) capsule Take 1 capsule by mouth daily.    Marland Kitchen omeprazole (PRILOSEC) 40 MG capsule Take 1 capsule by mouth  daily  . oxyCODONE-acetaminophen (PERCOCET/ROXICET) 5-325 MG tablet Take 1 tablet by mouth 2 (two) times daily as needed.  . Potassium 75 MG TABS Take by mouth daily.    . tamsulosin (FLOMAX) 0.4 MG CAPS capsule TAKE ONE CAPSULE BY MOUTH  EVERY DAY, 30 MINUTES AFTER SAME MEAL EACH DAY  . warfarin (COUMADIN) 5 MG tablet TAKE AS DIRECTED BY  COUMADIN CLINIC  . [DISCONTINUED] doxycycline (VIBRA-TABS) 100 MG tablet Take 1 tablet (100 mg total) by mouth 2 (two) times daily. (Patient not taking: Reported on 11/10/2015)   No facility-administered encounter medications on file as  of 11/10/2015.    Past Medical History  Diagnosis Date  . Hyperlipidemia   . Hypertension   . Diverticulitis   . Atrial fibrillation ([place])     chronic persistent, failed DCCV x3 in 2000  . ED (erectile dysfunction)   . Sleep disturbance   . Osteoarthritis   . Lumbar disc disease   . Cervical disc disease   . Impaired fasting glucose   . GERD (gastroesophageal reflux disease)   . BPH (benign prostatic hypertrophy)   . Allergic rhinitis     Past Surgical History  Procedure Laterality Date  . Shoulder surgery  1950's    dislocation  . Knee arthroscopy   06/1998    right Jefm Bingley)  . Hemiarthroplasty shoulder fracture  10/2004    left  . Shoulder surgery  12/2004    right  . Thr--left  4/12    Dr Jefm Harsha  . Joint replacement  4/12    Left hip--Dr Jefm Warnick  . Replacement unicondylar joint knee  8/12    Dr Jefm Harold  . Colonoscopy  2013  . Lumbar laminectomy  06/10/13    [facility]  . Cataract extraction, bilateral  2014    Social History  reports that he quit smoking about 53 years ago. His smoking use included Cigarettes. He has never used smokeless tobacco. He reports that he drinks alcohol. He reports that he does not use illicit drugs.  Family History family history includes Colon polyps in his brother; Stomach cancer in his mother; Stroke in his father. There is no history of Colon cancer.   Review of Systems  Constitutional: Negative.   Respiratory: Negative.   Cardiovascular: Negative.   Gastrointestinal: Negative.   Musculoskeletal: Positive for joint swelling and arthralgias.  Neurological: Negative.   Hematological: Negative.   Psychiatric/Behavioral: Negative.   All other systems reviewed and are negative.   BP 122/70 mmHg  Pulse 56  Ht 5\' 7"  (1.702 m)  Wt 185 lb 8 oz (84.142 kg)  BMI 29.05 kg/m2  Physical Exam  Constitutional: He is oriented to person, place, and time. He appears well-developed and well-nourished.  HENT:  Head: Normocephalic.  Nose: Nose normal.  Mouth/Throat: Oropharynx is clear and moist.  Eyes: Conjunctivae are normal. Pupils are equal, round, and reactive to light.  Neck: Normal range of motion. Neck supple. No JVD present.  Cardiovascular: Normal rate, regular rhythm, S1 normal, S2 normal, normal heart sounds and intact distal pulses.  Exam reveals no gallop and no friction rub.   No murmur heard. Pulmonary/Chest: Effort normal and breath sounds normal. No respiratory distress. He has no wheezes. He has no rales. He exhibits no tenderness.  Abdominal: Soft. Bowel sounds are normal. He  exhibits no distension. There is no tenderness.  Musculoskeletal: Normal range of motion. He exhibits no edema or tenderness.  Lymphadenopathy:    He has no cervical adenopathy.  Neurological: He is alert and oriented to person, place, and time. Coordination normal.  Skin: Skin is warm and dry. No rash noted. No erythema.  Psychiatric: He has a normal mood and affect. His behavior is normal. Judgment and thought content normal.      Assessment and Plan   Nursing note and vitals reviewed.

## 2015-11-10 NOTE — Assessment & Plan Note (Signed)
Cholesterol is at goal on the current lipid regimen. No changes to the medications were made.  

## 2015-11-10 NOTE — Assessment & Plan Note (Signed)
Chronic atrial fibrillation, rate well controlled Tolerating warfarin

## 2015-11-10 NOTE — Patient Instructions (Signed)
You are doing well. No medication changes were made.  Please call us if you have new issues that need to be addressed before your next appt.  Your physician wants you to follow-up in: 12 months.  You will receive a reminder letter in the mail two months in advance. If you don't receive a letter, please call our office to schedule the follow-up appointment. 

## 2015-11-12 ENCOUNTER — Other Ambulatory Visit: Payer: Self-pay | Admitting: Internal Medicine

## 2015-11-24 ENCOUNTER — Other Ambulatory Visit: Payer: Medicare Other

## 2015-11-26 ENCOUNTER — Ambulatory Visit (INDEPENDENT_AMBULATORY_CARE_PROVIDER_SITE_OTHER): Payer: Medicare Other | Admitting: *Deleted

## 2015-11-26 DIAGNOSIS — I4891 Unspecified atrial fibrillation: Secondary | ICD-10-CM

## 2015-11-26 DIAGNOSIS — Z5181 Encounter for therapeutic drug level monitoring: Secondary | ICD-10-CM | POA: Diagnosis not present

## 2015-11-26 LAB — POCT INR: INR: 1.9

## 2015-11-26 NOTE — Progress Notes (Signed)
Pre visit review using our clinic review tool, if applicable. No additional management support is needed unless otherwise documented below in the visit note. 

## 2016-01-05 ENCOUNTER — Ambulatory Visit: Payer: Medicare Other | Admitting: Internal Medicine

## 2016-01-07 ENCOUNTER — Other Ambulatory Visit (INDEPENDENT_AMBULATORY_CARE_PROVIDER_SITE_OTHER): Payer: Medicare Other

## 2016-01-07 ENCOUNTER — Ambulatory Visit (INDEPENDENT_AMBULATORY_CARE_PROVIDER_SITE_OTHER): Payer: Medicare Other | Admitting: Internal Medicine

## 2016-01-07 ENCOUNTER — Encounter: Payer: Self-pay | Admitting: Internal Medicine

## 2016-01-07 VITALS — BP 140/70 | HR 74 | Temp 97.7°F | Ht 67.0 in | Wt 181.0 lb

## 2016-01-07 DIAGNOSIS — I10 Essential (primary) hypertension: Secondary | ICD-10-CM

## 2016-01-07 DIAGNOSIS — Z0000 Encounter for general adult medical examination without abnormal findings: Secondary | ICD-10-CM | POA: Diagnosis not present

## 2016-01-07 DIAGNOSIS — I481 Persistent atrial fibrillation: Secondary | ICD-10-CM

## 2016-01-07 DIAGNOSIS — Z7189 Other specified counseling: Secondary | ICD-10-CM

## 2016-01-07 DIAGNOSIS — L570 Actinic keratosis: Secondary | ICD-10-CM | POA: Diagnosis not present

## 2016-01-07 DIAGNOSIS — I4891 Unspecified atrial fibrillation: Secondary | ICD-10-CM | POA: Diagnosis not present

## 2016-01-07 DIAGNOSIS — F39 Unspecified mood [affective] disorder: Secondary | ICD-10-CM

## 2016-01-07 DIAGNOSIS — I4819 Other persistent atrial fibrillation: Secondary | ICD-10-CM

## 2016-01-07 LAB — COMPREHENSIVE METABOLIC PANEL
ALT: 23 U/L (ref 0–53)
AST: 31 U/L (ref 0–37)
Albumin: 4.6 g/dL (ref 3.5–5.2)
Alkaline Phosphatase: 55 U/L (ref 39–117)
BUN: 16 mg/dL (ref 6–23)
CO2: 30 mEq/L (ref 19–32)
Calcium: 9.7 mg/dL (ref 8.4–10.5)
Chloride: 99 mEq/L (ref 96–112)
Creatinine, Ser: 0.84 mg/dL (ref 0.40–1.50)
GFR: 93.66 mL/min (ref >60.00)
Glucose, Bld: 103 mg/dL — ABNORMAL HIGH (ref 70–99)
Potassium: 4.2 mEq/L (ref 3.5–5.1)
Sodium: 136 mEq/L (ref 135–145)
Total Bilirubin: 0.6 mg/dL (ref 0.2–1.2)
Total Protein: 7.2 g/dL (ref 6.0–8.3)

## 2016-01-07 LAB — LIPID PANEL
Cholesterol: 138 mg/dL (ref 0–200)
HDL: 49.6 mg/dL (ref >39.00)
LDL Cholesterol: 76 mg/dL (ref 0–99)
NonHDL: 88.01
Total CHOL/HDL Ratio: 3
Triglycerides: 59 mg/dL (ref 0.0–149.0)
VLDL: 11.8 mg/dL (ref 0.0–40.0)

## 2016-01-07 LAB — CBC WITH DIFFERENTIAL/PLATELET
Basophils Absolute: 0 10*3/uL (ref 0.0–0.1)
Basophils Relative: 0.5 % (ref 0.0–3.0)
Eosinophils Absolute: 0.3 10*3/uL (ref 0.0–0.7)
Eosinophils Relative: 5.4 % — ABNORMAL HIGH (ref 0.0–5.0)
HCT: 43.6 % (ref 39.0–52.0)
Hemoglobin: 14.7 g/dL (ref 13.0–17.0)
Lymphocytes Relative: 24.2 % (ref 12.0–46.0)
Lymphs Abs: 1.4 10*3/uL (ref 0.7–4.0)
MCHC: 33.8 g/dL (ref 30.0–36.0)
MCV: 91.2 fl (ref 78.0–100.0)
Monocytes Absolute: 0.4 10*3/uL (ref 0.1–1.0)
Monocytes Relative: 7.3 % (ref 3.0–12.0)
Neutro Abs: 3.7 10*3/uL (ref 1.4–7.7)
Neutrophils Relative %: 62.6 % (ref 43.0–77.0)
Platelets: 211 10*3/uL (ref 150.0–400.0)
RBC: 4.78 Mil/uL (ref 4.22–5.81)
RDW: 13.6 % (ref 11.5–15.5)
WBC: 5.9 10*3/uL (ref 4.0–10.5)

## 2016-01-07 LAB — POCT INR: INR: 2.1

## 2016-01-07 MED ORDER — AZELASTINE HCL 0.1 % NA SOLN: 2.0000 | Freq: Every day | NASAL | Status: DC

## 2016-01-07 MED ORDER — OMEPRAZOLE 40 MG PO CPDR: 40.0000 mg | DELAYED_RELEASE_CAPSULE | Freq: Every day | ORAL | Status: DC

## 2016-01-07 MED ORDER — WARFARIN SODIUM 5 MG PO TABS: ORAL_TABLET | ORAL | Status: DC

## 2016-01-07 MED ORDER — ALPRAZOLAM 1 MG PO TABS: 1.0000 mg | ORAL_TABLET | Freq: Two times a day (BID) | ORAL | Status: DC | PRN

## 2016-01-07 MED ORDER — FLUTICASONE PROPIONATE 50 MCG/ACT NA SUSP: 2.0000 | Freq: Every day | NASAL | Status: DC

## 2016-01-07 MED ORDER — TAMSULOSIN HCL 0.4 MG PO CAPS: 0.4000 mg | ORAL_CAPSULE | Freq: Every day | ORAL | Status: DC

## 2016-01-07 MED ORDER — HYDROCHLOROTHIAZIDE 25 MG PO TABS: 25.0000 mg | ORAL_TABLET | Freq: Every day | ORAL | Status: DC

## 2016-01-07 MED ORDER — MONTELUKAST SODIUM 10 MG PO TABS: ORAL_TABLET | ORAL | Status: DC

## 2016-01-07 MED ORDER — OXYCODONE-ACETAMINOPHEN 5-325 MG PO TABS: 1.0000 | ORAL_TABLET | Freq: Two times a day (BID) | ORAL | Status: DC | PRN

## 2016-01-07 MED ORDER — LORATADINE 10 MG PO TABS: 10.0000 mg | ORAL_TABLET | Freq: Every day | ORAL | Status: AC

## 2016-01-07 NOTE — Patient Instructions (Signed)
Please try Drs Evorn Gong, Kellie Moor or Marolyn Hammock (Dr Buel Ream old office which has moved) or Drs [employer] office on [address] for a dermatologist to check that cyst on your nose.

## 2016-01-07 NOTE — Assessment & Plan Note (Signed)
Mild anxiety and sleep problems at times Uses the alprazolam

## 2016-01-07 NOTE — Assessment & Plan Note (Signed)
Rate control is good--reportedly slow at times, but no symptoms Continue the atenolol and warfarin

## 2016-01-07 NOTE — Assessment & Plan Note (Signed)
I have personally reviewed the Medicare Annual Wellness questionnaire and have noted 1. The patient's medical and social history 2. Their use of alcohol, tobacco or illicit drugs 3. Their current medications and supplements 4. The patient's functional ability including ADL's, fall risks, home safety risks and hearing or visual             impairment. 5. Diet and physical activities 6. Evidence for depression or mood disorders  The patients weight, height, BMI and visual acuity have been recorded in the chart I have made referrals, counseling and provided education to the patient based review of the above and I have provided the pt with a written personalized care plan for preventive services.  I have provided you with a copy of your personalized plan for preventive services. Please take the time to review along with your updated medication list.  UTD on immunizations---yearly flu No cancer screening due to age

## 2016-01-07 NOTE — Assessment & Plan Note (Signed)
Arm lesion treated 40 seconds x 2 with liquid nitrogen  Discussed home care Derm for the nasal lesion

## 2016-01-07 NOTE — Progress Notes (Signed)
Pre visit review using our clinic review tool, if applicable. No additional management support is needed unless otherwise documented below in the visit note. 

## 2016-01-07 NOTE — Assessment & Plan Note (Signed)
See social history 

## 2016-01-07 NOTE — Assessment & Plan Note (Signed)
BP Readings from Last 3 Encounters:  01/07/16 140/70  11/10/15 122/70  06/29/15 130/80   Reasonable control No changes

## 2016-01-07 NOTE — Addendum Note (Signed)
Addended by: Despina Hidden on: 01/07/2016 02:50 PM   Modules accepted: Orders

## 2016-01-07 NOTE — Progress Notes (Signed)
Subjective:    Patient ID: Mark Padilla, male    DOB: 07-16-1935, 81 y.o.   MRN: [id number]  HPI Here for Medicare wellness and follow up of chronic medical conditions Reviewed advanced directives Reviewed other doctors-- Dr Rockey Situ, [facility] are only doctors Occasional vodka and grapefruit juice No tobacco Exercises regularly Vision is fine. Hearing in left ear is not normal--but he does okay No falls No depression or anhedonia Independent with all instrumental ADLs Mild memory issues with names--but no sig cognitive issues Reviewed his medications  He has a place (knot) on right arm--wants checked Spots also on ears---scabbed and will bleed occasionally Also has raised area on right nasal bridge-- ?from his glasses. Not really painful  Recent cardiology evaluation with Dr Dorene Sorrow at least 3 miles just about every day Stamina is fair--- doesn't get SOB No dizziness or syncope No edema No palpitations  Voids okay Only occasional nocturia Heavy stream first time in AM---but slows and dribbles at times No incontinence  Mood generally good Will occasionally have anxiety act up--- only rarely will he use the alprazolam in day  Current Outpatient Prescriptions on File Prior to Visit  Medication Sig Dispense Refill  . ALPRAZolam (XANAX) 1 MG tablet Take 1 tablet (1 mg total) by mouth 2 (two) times daily as needed. 180 tablet 0  . Ascorbic Acid (VITAMIN C) 500 MG tablet Take 500 mg by mouth daily.      Marland Kitchen atenolol (TENORMIN) 25 MG tablet Take 1 tablet by mouth  daily 90 tablet 3  . atorvastatin (LIPITOR) 10 MG tablet Take 1 tablet by mouth  daily 90 tablet 3  . azelastine (ASTELIN) 0.1 % nasal spray Place 2 sprays into both nostrils at bedtime. 90 mL 3  . CVS SENNA PLUS 8.6-50 MG per tablet TAKE 2 TABLETS BY MOUTH TWICE A DAY 180 tablet 7  . fluticasone (FLONASE) 50 MCG/ACT nasal spray Use 2 sprays nasally daily 48 g 0  . gabapentin (NEURONTIN) 300 MG capsule Take  1 capsule by mouth 3  times daily 270 capsule 3  . hydrochlorothiazide (HYDRODIURIL) 25 MG tablet Take 1 tablet by mouth  daily 90 tablet 1  . loratadine (CLARITIN) 10 MG tablet Take 1 tablet (10 mg total) by mouth daily. 90 tablet 0  . meloxicam (MOBIC) 7.5 MG tablet Take 1 tablet (7.5 mg total) by mouth daily. 90 tablet 3  . montelukast (SINGULAIR) 10 MG tablet Take 1 tablet by mouth at  bedtime 90 tablet 1  . Multiple Vitamin (MULTIVITAMIN) capsule Take 1 capsule by mouth daily.      Marland Kitchen omeprazole (PRILOSEC) 40 MG capsule Take 1 capsule by mouth  daily 90 capsule 1  . Potassium 75 MG TABS Take by mouth daily.      . tamsulosin (FLOMAX) 0.4 MG CAPS capsule TAKE ONE CAPSULE BY MOUTH  EVERY DAY, 30 MINUTES AFTER SAME MEAL EACH DAY 90 capsule 1  . warfarin (COUMADIN) 5 MG tablet TAKE AS DIRECTED BY  COUMADIN CLINIC 90 tablet 3   No current facility-administered medications on file prior to visit.    Allergies  Allergen Reactions  . Simvastatin     Listlessness, fatigue  . Mucinex D [Pseudoephedrine-Guaifenesin Er]   . Undecylenic Acid   . Flexeril [Cyclobenzaprine Hcl] Anxiety  . Skelaxin Anxiety    Past Medical History  Diagnosis Date  . Hyperlipidemia   . Hypertension   . Diverticulitis   . Atrial fibrillation ([place])  chronic persistent, failed DCCV x3 in 2000  . ED (erectile dysfunction)   . Sleep disturbance   . Osteoarthritis   . Lumbar disc disease   . Cervical disc disease   . Impaired fasting glucose   . GERD (gastroesophageal reflux disease)   . BPH (benign prostatic hypertrophy)   . Allergic rhinitis     Past Surgical History  Procedure Laterality Date  . Shoulder surgery  1950's    dislocation  . Knee arthroscopy  06/1998    right Jefm Star)  . Hemiarthroplasty shoulder fracture  10/2004    left  . Shoulder surgery  12/2004    right  . Thr--left  4/12    Dr Jefm Alderman  . Joint replacement  4/12    Left hip--Dr Jefm Skelley  . Replacement unicondylar joint  knee  8/12    Dr Jefm Altidor  . Colonoscopy  2013  . Lumbar laminectomy  06/10/13    [facility]  . Cataract extraction, bilateral  2014    Family History  Problem Relation Age of Onset  . Stomach cancer Mother   . Stroke Father   . Colon polyps Brother   . Colon cancer Neg Hx     Social History   Social History  . Marital Status: Divorced    Spouse Name: N/A  . Number of Children: 2  . Years of Education: N/A   Occupational History  . retired, [employer] works part time [place]  . Smoking status: Former Smoker    Types: Cigarettes    Quit date: 11/21/1962  . Smokeless tobacco: Never Used  . Alcohol Use: 0.0 oz/week    0 Standard drinks or equivalent per week     Comment: occasional  . Drug Use: No  . Sexual Activity: Not on file   Other Topics Concern  . Not on file   Social History Narrative   Plans to do living will   Will designate daughter [name] as health care POA.    Would accept resuscitation but no prolonged artificial ventilation   Would not want tube feeds if cognitively unaware   Review of Systems Sleeping better lately--uses the alprazolam for this Notes trouble writing at times Some left hip pain--- once in a while. Has torn muscle that healed back down the leg some Chronic back problems are somewhat better--hasn't gone to the ortho since the surgery Appetite is good Weight down slightly Bowels are fine. Uses 2 senna daily. No blood in stool. No abdominal pain No heartburn on omeprazole. No dysphagia    Objective:   Physical Exam  Constitutional: He is oriented to person, place, and time. He appears well-developed and well-nourished. No distress.  HENT:  Mouth/Throat: Oropharynx is clear and moist. No oropharyngeal exudate.  Full dentures  Neck: Normal range of motion. Neck supple. No thyromegaly present.  Cardiovascular: Normal rate, normal heart sounds and intact distal pulses.   irregular  Pulmonary/Chest:  Effort normal and breath sounds normal. No respiratory distress. He has no wheezes. He has no rales.  Abdominal: Soft. There is no tenderness.  Musculoskeletal: He exhibits no edema or tenderness.  Lymphadenopathy:    He has no cervical adenopathy.  Neurological: He is alert and oriented to person, place, and time.  President-- "Dwaine Deter, Clinton--?" [id number] Recall 2/3  Skin:  3-27mm actinic on extensor right arm Scaling on tops of ears--but no clear actinic Small cyst on inside top of right nose  Psychiatric: He has  a normal mood and affect. His behavior is normal.          Assessment & Plan:

## 2016-01-08 ENCOUNTER — Ambulatory Visit (INDEPENDENT_AMBULATORY_CARE_PROVIDER_SITE_OTHER): Payer: Medicare Other | Admitting: *Deleted

## 2016-01-08 ENCOUNTER — Encounter: Payer: Self-pay | Admitting: *Deleted

## 2016-01-08 DIAGNOSIS — I481 Persistent atrial fibrillation: Secondary | ICD-10-CM

## 2016-01-08 DIAGNOSIS — I4819 Other persistent atrial fibrillation: Secondary | ICD-10-CM

## 2016-01-08 DIAGNOSIS — Z5181 Encounter for therapeutic drug level monitoring: Secondary | ICD-10-CM

## 2016-01-08 NOTE — Progress Notes (Signed)
Pre visit review using our clinic review tool, if applicable. No additional management support is needed unless otherwise documented below in the visit note. 

## 2016-02-05 ENCOUNTER — Other Ambulatory Visit: Payer: Self-pay | Admitting: Internal Medicine

## 2016-02-18 ENCOUNTER — Ambulatory Visit (INDEPENDENT_AMBULATORY_CARE_PROVIDER_SITE_OTHER): Payer: Medicare Other | Admitting: *Deleted

## 2016-02-18 DIAGNOSIS — Z5181 Encounter for therapeutic drug level monitoring: Secondary | ICD-10-CM

## 2016-02-18 DIAGNOSIS — I4891 Unspecified atrial fibrillation: Secondary | ICD-10-CM | POA: Diagnosis not present

## 2016-02-18 DIAGNOSIS — I4819 Other persistent atrial fibrillation: Secondary | ICD-10-CM

## 2016-02-18 DIAGNOSIS — I481 Persistent atrial fibrillation: Secondary | ICD-10-CM

## 2016-02-18 LAB — POCT INR: INR: 1.8

## 2016-02-18 NOTE — Progress Notes (Signed)
Pre visit review using our clinic review tool, if applicable. No additional management support is needed unless otherwise documented below in the visit note. 

## 2016-02-18 NOTE — Progress Notes (Signed)
INR is sub therapeutic again today.  Patient denies any missed doses or other medication changes.  He has been eating greens more frequently over the past few weeks.  Encouraged patient to avoid greens for the next 2-3 days, then to remain consistent beyond that.  Will make additional adjustments at next recheck if needed.

## 2016-03-02 ENCOUNTER — Other Ambulatory Visit: Payer: Self-pay

## 2016-03-02 MED ORDER — OXYCODONE-ACETAMINOPHEN 5-325 MG PO TABS: 1.0000 | ORAL_TABLET | Freq: Two times a day (BID) | ORAL | Status: DC | PRN

## 2016-03-02 NOTE — Telephone Encounter (Signed)
Spoke to patient. rx is up front ready for pickup

## 2016-03-02 NOTE — Telephone Encounter (Signed)
Pt left v/m requesting rx oxycodone apap. Call when ready for pick up. Pt last seen and rx last printed # 50 on 01/07/16.

## 2016-03-31 ENCOUNTER — Ambulatory Visit (INDEPENDENT_AMBULATORY_CARE_PROVIDER_SITE_OTHER): Payer: Medicare Other | Admitting: *Deleted

## 2016-03-31 DIAGNOSIS — I4819 Other persistent atrial fibrillation: Secondary | ICD-10-CM

## 2016-03-31 DIAGNOSIS — I4891 Unspecified atrial fibrillation: Secondary | ICD-10-CM | POA: Diagnosis not present

## 2016-03-31 DIAGNOSIS — I481 Persistent atrial fibrillation: Secondary | ICD-10-CM | POA: Diagnosis not present

## 2016-03-31 DIAGNOSIS — Z5181 Encounter for therapeutic drug level monitoring: Secondary | ICD-10-CM

## 2016-03-31 LAB — POCT INR: INR: 2.4

## 2016-03-31 NOTE — Progress Notes (Signed)
Pre visit review using our clinic review tool, if applicable. No additional management support is needed unless otherwise documented below in the visit note. 

## 2016-05-09 ENCOUNTER — Telehealth: Payer: Self-pay | Admitting: *Deleted

## 2016-05-09 NOTE — Telephone Encounter (Signed)
Patient left a voicemail requesting a refill on Oxycodone Last refill 03/02/16 #60 Last office visit 01/07/16 Call when ready for pickup

## 2016-05-10 ENCOUNTER — Telehealth: Payer: Self-pay | Admitting: Internal Medicine

## 2016-05-10 MED ORDER — OXYCODONE-ACETAMINOPHEN 5-325 MG PO TABS: 1.0000 | ORAL_TABLET | Freq: Two times a day (BID) | ORAL | Status: DC | PRN

## 2016-05-10 NOTE — Telephone Encounter (Signed)
Patient called to find out if his rx is ready for pick up.  Please call patient back.

## 2016-05-10 NOTE — Telephone Encounter (Signed)
Spoke to pt

## 2016-05-10 NOTE — Telephone Encounter (Signed)
Please call him

## 2016-05-10 NOTE — Telephone Encounter (Signed)
Rx given to Dr Silvio Pate to sign

## 2016-05-10 NOTE — Telephone Encounter (Signed)
Left message to call back because no DPR on file. His RX is up front ready for pickup

## 2016-05-10 NOTE — Telephone Encounter (Signed)
Pt returned your call, please call back  Thank you

## 2016-05-12 ENCOUNTER — Other Ambulatory Visit: Payer: Medicare Other

## 2016-05-16 ENCOUNTER — Other Ambulatory Visit (INDEPENDENT_AMBULATORY_CARE_PROVIDER_SITE_OTHER): Payer: Medicare Other

## 2016-05-16 DIAGNOSIS — I4891 Unspecified atrial fibrillation: Secondary | ICD-10-CM

## 2016-05-16 LAB — POCT INR: INR: 2.5

## 2016-06-18 ENCOUNTER — Other Ambulatory Visit: Payer: Self-pay | Admitting: Internal Medicine

## 2016-06-27 ENCOUNTER — Other Ambulatory Visit (INDEPENDENT_AMBULATORY_CARE_PROVIDER_SITE_OTHER): Payer: Medicare Other

## 2016-06-27 ENCOUNTER — Telehealth: Payer: Self-pay

## 2016-06-27 DIAGNOSIS — I4891 Unspecified atrial fibrillation: Secondary | ICD-10-CM | POA: Diagnosis not present

## 2016-06-27 LAB — POCT INR: INR: 4.3

## 2016-07-04 MED ORDER — OXYCODONE-ACETAMINOPHEN 5-325 MG PO TABS: 1.0000 | ORAL_TABLET | Freq: Two times a day (BID) | ORAL | 0 refills | Status: DC | PRN

## 2016-07-04 NOTE — Telephone Encounter (Signed)
Pt left VM on triage phone requesting refill on oxyCODONE-acetaminophen (PERCOCET/ROXICET) 5-325 MG tablet states it has been one week and has not had cqllback.  Please advise.

## 2016-07-04 NOTE — Telephone Encounter (Signed)
Spoke to pt. RX up front ready for pickup. Not sure what happened with the original message

## 2016-07-11 ENCOUNTER — Other Ambulatory Visit (INDEPENDENT_AMBULATORY_CARE_PROVIDER_SITE_OTHER): Payer: Medicare Other

## 2016-07-11 DIAGNOSIS — I4891 Unspecified atrial fibrillation: Secondary | ICD-10-CM

## 2016-07-11 LAB — POCT INR: INR: 1.5

## 2016-07-26 ENCOUNTER — Other Ambulatory Visit (INDEPENDENT_AMBULATORY_CARE_PROVIDER_SITE_OTHER): Payer: Medicare Other

## 2016-07-26 DIAGNOSIS — I4891 Unspecified atrial fibrillation: Secondary | ICD-10-CM | POA: Diagnosis not present

## 2016-07-26 LAB — POCT INR: INR: 2.3

## 2016-08-06 ENCOUNTER — Other Ambulatory Visit: Payer: Self-pay | Admitting: Internal Medicine

## 2016-08-22 ENCOUNTER — Other Ambulatory Visit: Payer: Self-pay

## 2016-08-22 MED ORDER — ALPRAZOLAM 1 MG PO TABS: 1.0000 mg | ORAL_TABLET | Freq: Two times a day (BID) | ORAL | 0 refills | Status: DC | PRN

## 2016-08-22 NOTE — Telephone Encounter (Signed)
Spoke to pt. He does want #180. Rx needs to be printed and faxed to [facility]

## 2016-08-22 NOTE — Telephone Encounter (Signed)
Left message to call the office.

## 2016-08-22 NOTE — Telephone Encounter (Signed)
Approved:  #180 x 0 See if he still wants that much if it lasts that long (not long enough to go bad though)

## 2016-08-22 NOTE — Telephone Encounter (Signed)
Rx faxed from external fax to [facility]

## 2016-08-22 NOTE — Telephone Encounter (Signed)
LAST FILLED ON 01/07/16 #180, LAST OV 06/29/15. OK TO REFILL?

## 2016-08-29 ENCOUNTER — Ambulatory Visit (INDEPENDENT_AMBULATORY_CARE_PROVIDER_SITE_OTHER): Payer: Medicare Other

## 2016-08-29 ENCOUNTER — Other Ambulatory Visit: Payer: Self-pay

## 2016-08-29 ENCOUNTER — Other Ambulatory Visit (INDEPENDENT_AMBULATORY_CARE_PROVIDER_SITE_OTHER): Payer: Medicare Other

## 2016-08-29 DIAGNOSIS — I4891 Unspecified atrial fibrillation: Secondary | ICD-10-CM

## 2016-08-29 DIAGNOSIS — Z23 Encounter for immunization: Secondary | ICD-10-CM | POA: Diagnosis not present

## 2016-08-29 LAB — POCT INR: INR: 3

## 2016-08-29 MED ORDER — OXYCODONE-ACETAMINOPHEN 5-325 MG PO TABS: 1.0000 | ORAL_TABLET | Freq: Two times a day (BID) | ORAL | 0 refills | Status: DC | PRN

## 2016-08-29 NOTE — Telephone Encounter (Signed)
Last filled 07-04-16 #60 Last OV 01-07-16 Next OV 01-11-17. Forwarding to Dr Lorelei Pont in Dr Alla German absence. Please send back to [facility] when complete.

## 2016-08-29 NOTE — Telephone Encounter (Signed)
Spoke to pt. RX up front ready for pickup 

## 2016-09-22 ENCOUNTER — Other Ambulatory Visit: Payer: Self-pay | Admitting: Internal Medicine

## 2016-09-26 ENCOUNTER — Other Ambulatory Visit: Payer: Medicare Other

## 2016-09-29 ENCOUNTER — Other Ambulatory Visit (INDEPENDENT_AMBULATORY_CARE_PROVIDER_SITE_OTHER): Payer: Medicare Other

## 2016-09-29 DIAGNOSIS — I4891 Unspecified atrial fibrillation: Secondary | ICD-10-CM

## 2016-09-29 DIAGNOSIS — Z5181 Encounter for therapeutic drug level monitoring: Secondary | ICD-10-CM

## 2016-09-29 LAB — POCT INR: INR: 2.8

## 2016-10-06 NOTE — Telephone Encounter (Signed)
Opened in error

## 2016-10-31 ENCOUNTER — Other Ambulatory Visit: Payer: Self-pay | Admitting: *Deleted

## 2016-10-31 MED ORDER — OXYCODONE-ACETAMINOPHEN 5-325 MG PO TABS: 1.0000 | ORAL_TABLET | Freq: Two times a day (BID) | ORAL | 0 refills | Status: DC | PRN

## 2016-10-31 NOTE — Telephone Encounter (Signed)
Spoke to pt. Rx up front ready for pickup 

## 2016-10-31 NOTE — Telephone Encounter (Signed)
Patient left a voicemail requesting refill on Oxycodone Last refill 08/29/16 #60 Last office visit 01/07/16

## 2016-11-04 ENCOUNTER — Ambulatory Visit (INDEPENDENT_AMBULATORY_CARE_PROVIDER_SITE_OTHER): Payer: Medicare Other | Admitting: Family Medicine

## 2016-11-04 ENCOUNTER — Encounter: Payer: Self-pay | Admitting: Family Medicine

## 2016-11-04 DIAGNOSIS — L299 Pruritus, unspecified: Secondary | ICD-10-CM | POA: Diagnosis not present

## 2016-11-04 DIAGNOSIS — R059 Cough, unspecified: Secondary | ICD-10-CM | POA: Insufficient documentation

## 2016-11-04 DIAGNOSIS — B9789 Other viral agents as the cause of diseases classified elsewhere: Secondary | ICD-10-CM

## 2016-11-04 DIAGNOSIS — R05 Cough: Secondary | ICD-10-CM

## 2016-11-04 DIAGNOSIS — H59033 Cystoid macular edema following cataract surgery, bilateral: Secondary | ICD-10-CM | POA: Diagnosis not present

## 2016-11-04 DIAGNOSIS — J069 Acute upper respiratory infection, unspecified: Secondary | ICD-10-CM | POA: Insufficient documentation

## 2016-11-04 DIAGNOSIS — Z961 Presence of intraocular lens: Secondary | ICD-10-CM | POA: Diagnosis not present

## 2016-11-04 DIAGNOSIS — R21 Rash and other nonspecific skin eruption: Secondary | ICD-10-CM | POA: Diagnosis not present

## 2016-11-04 DIAGNOSIS — H3530 Unspecified macular degeneration: Secondary | ICD-10-CM | POA: Diagnosis not present

## 2016-11-04 MED ORDER — TRIAMCINOLONE ACETONIDE 0.1 % EX CREA: 1.0000 "application " | TOPICAL_CREAM | Freq: Two times a day (BID) | CUTANEOUS | 0 refills | Status: DC

## 2016-11-04 MED ORDER — AMOXICILLIN 500 MG PO CAPS: 1000.0000 mg | ORAL_CAPSULE | Freq: Two times a day (BID) | ORAL | 0 refills | Status: DC

## 2016-11-04 NOTE — Progress Notes (Signed)
   Subjective:    Patient ID: Mark Padilla, male    DOB: [birth year], 81 y.o.   MRN: [id number]  Cough  This is a new problem. The current episode started 1 to 4 weeks ago (2 weeks). The problem has been unchanged. The cough is productive of sputum. Associated symptoms include chills, a fever, nasal congestion, postnasal drip, shortness of breath and wheezing. Pertinent negatives include no ear congestion, ear pain or sore throat. Associated symptoms comments: Subjective fever last night  no sinus pain. Risk factors: remote light smoker. He has tried OTC cough suppressant (use nasal azeltine and flonase spray, singulair) for the symptoms. The treatment provided mild relief. His past medical history is significant for environmental allergies. There is no history of COPD or emphysema.  Fever   Associated symptoms include coughing and wheezing. Pertinent negatives include no ear pain or sore throat.   Bothering much more is the following: He has noted itching  under  Upper arm, chest , armpit and in groin in last  month  Same issue off and on for years.  has tried lamisil foot cream.. Helps. Some.  No new meds, no new exposures  Review of Systems  Constitutional: Positive for chills and fever.  HENT: Positive for postnasal drip. Negative for ear pain and sore throat.   Respiratory: Positive for cough, shortness of breath and wheezing.   Allergic/Immunologic: Positive for environmental allergies.       Objective:   Physical Exam  Constitutional: Vital signs are normal. He appears well-developed and well-nourished.  Non-toxic appearance. He does not appear ill. No distress.  HENT:  Head: Normocephalic and atraumatic.  Right Ear: Hearing, tympanic membrane, external ear and ear canal normal. No tenderness. No foreign bodies. Tympanic membrane is not retracted and not bulging.  Left Ear: Hearing, tympanic membrane, external ear and ear canal normal. No tenderness. No foreign bodies. Tympanic  membrane is not retracted and not bulging.  Nose: Nose normal. No mucosal edema or rhinorrhea. Right sinus exhibits no maxillary sinus tenderness and no frontal sinus tenderness. Left sinus exhibits no maxillary sinus tenderness and no frontal sinus tenderness.  Mouth/Throat: Uvula is midline, oropharynx is clear and moist and mucous membranes are normal. Normal dentition. No dental caries. No oropharyngeal exudate or tonsillar abscesses.  Eyes: Conjunctivae, EOM and lids are normal. Pupils are equal, round, and reactive to light. Lids are everted and swept, no foreign bodies found.  Neck: Trachea normal, normal range of motion and phonation normal. Neck supple. Carotid bruit is not present. No thyroid mass and no thyromegaly present.  Cardiovascular: Normal rate, regular rhythm, S1 normal, S2 normal, normal heart sounds, intact distal pulses and normal pulses.  Exam reveals no gallop.   No murmur heard. Pulmonary/Chest: Effort normal and breath sounds normal. No respiratory distress. He has no wheezes. He has no rhonchi. He has no rales.  Abdominal: Soft. Normal appearance and bowel sounds are normal. There is no hepatosplenomegaly. There is no tenderness. There is no rebound, no guarding and no CVA tenderness. No hernia.  Neurological: He is alert. He has normal reflexes.  Skin: Skin is warm, dry and intact. Rash noted. Rash is nodular. Rash is not pustular and not vesicular.  Excoriations and erythematous nodules on torso and neck, lower legs and arms.  Psychiatric: He has a normal mood and affect. His speech is normal and behavior is normal. Judgment normal.          Assessment & Plan:

## 2016-11-04 NOTE — Progress Notes (Signed)
Pre visit review using our clinic review tool, if applicable. No additional management support is needed unless otherwise documented below in the visit note. 

## 2016-11-04 NOTE — Patient Instructions (Addendum)
Given new fever and length of symtpoms: will cover for bacterial infeciton with  antibiotics  Apply cetaphil moisturizing cream to body twice daily and after bath/shower.  Apply topical steroid cream to itchy areas twice daily x 2 weeks.  if not improving follow up with  Dr. Silvio Pate.

## 2016-11-08 NOTE — Assessment & Plan Note (Addendum)
Rash only in areas  He can scratch. He has very dry skin.  ? Contact or allergic dermatitis versus rash from severe dry skin and associated scratching. Apply cetaphil moisturizing cream to body twice daily and after bath/shower. Apply topical steroid cream to itchy areas twice daily x 2 weeks.  If not improving follow up with  Dr. Silvio Pate.

## 2016-11-08 NOTE — Assessment & Plan Note (Signed)
Given new fever and length of symtpoms: will cover for bacterial infeciton with  Antibiotics.

## 2016-11-08 NOTE — Assessment & Plan Note (Signed)
No current rash.. descricption of location and appearance of rash sounds fungal in past. Can use topical antifungal on this area if recurs.

## 2016-11-11 ENCOUNTER — Ambulatory Visit (INDEPENDENT_AMBULATORY_CARE_PROVIDER_SITE_OTHER): Payer: Medicare Other

## 2016-11-11 DIAGNOSIS — I4891 Unspecified atrial fibrillation: Secondary | ICD-10-CM

## 2016-11-11 DIAGNOSIS — Z5181 Encounter for therapeutic drug level monitoring: Secondary | ICD-10-CM | POA: Diagnosis not present

## 2016-11-11 LAB — POCT INR: INR: 2

## 2016-11-11 NOTE — Patient Instructions (Signed)
Pre visit review using our clinic review tool, if applicable. No additional management support is needed unless otherwise documented below in the visit note. 

## 2016-11-15 ENCOUNTER — Ambulatory Visit: Payer: Medicare Other

## 2016-11-17 ENCOUNTER — Encounter: Payer: Self-pay | Admitting: Cardiovascular Disease

## 2016-11-17 ENCOUNTER — Ambulatory Visit (INDEPENDENT_AMBULATORY_CARE_PROVIDER_SITE_OTHER): Payer: Medicare Other | Admitting: Cardiovascular Disease

## 2016-11-17 VITALS — BP 140/74 | HR 53 | Ht 67.0 in | Wt 186.0 lb

## 2016-11-17 DIAGNOSIS — E782 Mixed hyperlipidemia: Secondary | ICD-10-CM | POA: Diagnosis not present

## 2016-11-17 DIAGNOSIS — I10 Essential (primary) hypertension: Secondary | ICD-10-CM | POA: Diagnosis not present

## 2016-11-17 DIAGNOSIS — I4891 Unspecified atrial fibrillation: Secondary | ICD-10-CM

## 2016-11-17 NOTE — Patient Instructions (Addendum)
Medication Instructions:   Please try to cut the atenolol in 1/2 daily  Labwork:  No new labs needed  Testing/Procedures:  No further testing at this time   I recommend watching educational videos on topics of interest to you at:       [email or web address]  Enter code: [email or web address]    Follow-Up: It was a pleasure seeing you in the office today. Please call us if you have new issues that need to be addressed before your next appt.  [phone]  Your physician wants you to follow-up in: 12 months.  You will receive a reminder letter in the mail two months in advance. If you don't receive a letter, please call our office to schedule the follow-up appointment.  If you need a refill on your cardiac medications before your next appointment, please call your pharmacy.

## 2016-11-17 NOTE — Progress Notes (Signed)
Cardiology Office Note  Date:  11/17/2016   ID:  Mark Padilla, DOB Feb 07, 1935, MRN [id number]  PCP:  Viviana Simpler, MD   Chief Complaint  Patient presents with  . other    79mo f/u. Pt states he is doing well. Reviewed meds with pt verbally.    HPI:  81 year-old gentleman with chronic atrial fibrillation on warfarin, HTN, remote hx of  DC cardioversion that was unsuccessful, hyperlipidemia, hip replacement on the left in April 2012, right knee replacement August 2012. Recent back surgery July 2014 with decompression  previously on Zocor and this was stopped after he had significant weight loss, malaise Changed to Lipitor He presents for routine follow-up of his atrial fibrillation   reports that he is doing very well He walks 3 miles per day on average, Total cholesterol 138, LDL 76 Denies any chest pain concerning for angina, no shortness of breath Continues to have chronic low back pain, joint pain  EKG on today's visit shows atrial fibrillation with ventricular rate 53  bpm, no significant ST or T-wave changes   Other past medical history He reports he is doing better since his low back surgery in July 2014. He is walking 30 minutes per day. Recent upper respiratory infection. Took Mucinex D  and it and had malaise, palpitations. Took in one week to get over this feeling. Now feels back to normal.  no complaints on low-dose Lipitor   He denies chest pain, shortness of breath, edema, or palpitations. no lightheadedness or near syncope.     PMH:   has a past medical history of Allergic rhinitis; Atrial fibrillation ([place]); BPH (benign prostatic hypertrophy); Cervical disc disease; Diverticulitis; ED (erectile dysfunction); GERD (gastroesophageal reflux disease); Hyperlipidemia; Hypertension; Impaired fasting glucose; Lumbar disc disease; Osteoarthritis; and Sleep disturbance.  PSH:    Past Surgical History:  Procedure Laterality Date  . CATARACT EXTRACTION, BILATERAL   2014  . COLONOSCOPY  2013  . HEMIARTHROPLASTY SHOULDER FRACTURE  10/2004   left  . JOINT REPLACEMENT  4/12   Left hip--Dr Jefm Reagle  . KNEE ARTHROSCOPY  06/1998   right Jefm Wrubel)  . LUMBAR LAMINECTOMY  06/10/13   [facility]  . REPLACEMENT UNICONDYLAR JOINT KNEE  8/12   Dr Jefm Kloepfer  . SHOULDER SURGERY  1950's   dislocation  . SHOULDER SURGERY  12/2004   right  . THR--left  4/12   Dr Jefm Silverstein    Current Outpatient Prescriptions  Medication Sig Dispense Refill  . ALPRAZolam (XANAX) 1 MG tablet Take 1 tablet (1 mg total) by mouth 2 (two) times daily as needed. 180 tablet 0  . Ascorbic Acid (VITAMIN C) 500 MG tablet Take 500 mg by mouth daily.      Marland Kitchen atenolol (TENORMIN) 25 MG tablet TAKE 1 TABLET BY MOUTH  DAILY 90 tablet 1  . atorvastatin (LIPITOR) 10 MG tablet TAKE 1 TABLET BY MOUTH  DAILY 90 tablet 1  . azelastine (ASTELIN) 0.1 % nasal spray Place 2 sprays into both nostrils at bedtime. 90 mL 3  . [facility] SENNA PLUS 8.6-50 MG per tablet TAKE 2 TABLETS BY MOUTH TWICE A DAY 180 tablet 7  . fluticasone (FLONASE) 50 MCG/ACT nasal spray USE 2 SPRAYS IN EACH  NOSTRIL DAILY 48 g 3  . gabapentin (NEURONTIN) 300 MG capsule TAKE 1 CAPSULE BY MOUTH 3  TIMES DAILY 270 capsule 1  . hydrochlorothiazide (HYDRODIURIL) 25 MG tablet Take 1 tablet (25 mg total) by mouth daily. 90 tablet 3  . loratadine (CLARITIN)  10 MG tablet Take 1 tablet (10 mg total) by mouth daily. 90 tablet 3  . meloxicam (MOBIC) 7.5 MG tablet Take 1 tablet by mouth  daily 90 tablet 1  . montelukast (SINGULAIR) 10 MG tablet Take 1 tablet by mouth at  bedtime 90 tablet 3  . Multiple Vitamin (MULTIVITAMIN) capsule Take 1 capsule by mouth daily.      Marland Kitchen omeprazole (PRILOSEC) 40 MG capsule Take 1 capsule (40 mg total) by mouth daily. 90 capsule 3  . oxyCODONE-acetaminophen (PERCOCET/ROXICET) 5-325 MG tablet Take 1 tablet by mouth 2 (two) times daily as needed. 60 tablet 0  . Potassium 75 MG TABS Take by mouth daily.      . tamsulosin (FLOMAX)  0.4 MG CAPS capsule Take 1 capsule (0.4 mg total) by mouth daily. 90 capsule 3  . warfarin (COUMADIN) 5 MG tablet TAKE AS DIRECTED BY  COUMADIN CLINIC 90 tablet 3   No current facility-administered medications for this visit.      Allergies:   Simvastatin; Mucinex d [pseudoephedrine-guaifenesin er]; Undecylenic acid; Flexeril [cyclobenzaprine hcl]; and Skelaxin   Social History:  The patient  reports that he quit smoking about 54 years ago. His smoking use included Cigarettes. He has never used smokeless tobacco. He reports that he drinks alcohol. He reports that he does not use drugs.   Family History:   family history includes Colon polyps in his brother; Stomach cancer in his mother; Stroke in his father.    Review of Systems: Review of Systems  Constitutional: Negative.   Respiratory: Negative.   Cardiovascular: Negative.   Gastrointestinal: Negative.   Musculoskeletal: Positive for back pain and joint pain.  Neurological: Negative.   Psychiatric/Behavioral: Negative.   All other systems reviewed and are negative.    PHYSICAL EXAM: VS:  BP 140/74 (BP Location: Left Arm, Patient Position: Sitting, Cuff Size: Normal)   Pulse (!) 53   Ht 5\' 7"  (1.702 m)   Wt 186 lb (84.4 kg)   BMI 29.13 kg/m  , BMI Body mass index is 29.13 kg/m. GEN: Well nourished, well developed, in no acute distress  HEENT: normal  Neck: no JVD, carotid bruits, or masses Cardiac: Irregularly irregular, no murmurs, rubs, or gallops,no edema  Respiratory:  clear to auscultation bilaterally, normal work of breathing GI: soft, nontender, nondistended, + BS MS: no deformity or atrophy  Skin: warm and dry, no rash Neuro:  Strength and sensation are intact Psych: euthymic mood, full affect    Recent Labs: 01/07/2016: ALT 23; BUN 16; Creatinine, Ser 0.84; Hemoglobin 14.7; Platelets 211.0; Potassium 4.2; Sodium 136    Lipid Panel Lab Results  Component Value Date   CHOL 138 01/07/2016   HDL 49.60  01/07/2016   LDLCALC 76 01/07/2016   TRIG 59.0 01/07/2016      Wt Readings from Last 3 Encounters:  11/17/16 186 lb (84.4 kg)  11/04/16 186 lb 12 oz (84.7 kg)  01/07/16 181 lb (82.1 kg)       ASSESSMENT AND PLAN:  Atrial fibrillation, unspecified type ([place]) - Plan: EKG 12-Lead Rate well controlled, tolerating warfarin Given bradycardia, recommended he decrease atenolol down to 12.5 mg daily  Mixed hyperlipidemia Cholesterol is at goal on the current lipid regimen. No changes to the medications were made. Recent weight gain from overeating at the beach  Essential hypertension Blood pressure is well controlled on today's visit. No changes made to the medications.   Total encounter time more than 15 minutes  Greater than 50%  was spent in counseling and coordination of care with the patient   Disposition:   F/U  12 months   Orders Placed This Encounter  Procedures  . EKG 12-Lead     Signed, Esmond Plants, M.D., Ph.D. 11/17/2016  [place], [place]

## 2016-12-20 ENCOUNTER — Other Ambulatory Visit: Payer: Self-pay

## 2016-12-20 ENCOUNTER — Ambulatory Visit (INDEPENDENT_AMBULATORY_CARE_PROVIDER_SITE_OTHER): Payer: Medicare Other

## 2016-12-20 DIAGNOSIS — Z5181 Encounter for therapeutic drug level monitoring: Secondary | ICD-10-CM

## 2016-12-20 DIAGNOSIS — I4891 Unspecified atrial fibrillation: Secondary | ICD-10-CM

## 2016-12-20 LAB — POCT INR: INR: 2.5

## 2016-12-20 MED ORDER — GABAPENTIN 300 MG PO CAPS: 300.0000 mg | ORAL_CAPSULE | Freq: Three times a day (TID) | ORAL | 3 refills | Status: DC

## 2016-12-20 MED ORDER — ATORVASTATIN CALCIUM 10 MG PO TABS: 10.0000 mg | ORAL_TABLET | Freq: Every day | ORAL | 3 refills | Status: DC

## 2016-12-20 MED ORDER — ATENOLOL 25 MG PO TABS: 25.0000 mg | ORAL_TABLET | Freq: Every day | ORAL | 3 refills | Status: DC

## 2016-12-20 MED ORDER — FLUTICASONE PROPIONATE 50 MCG/ACT NA SUSP: 2.0000 | Freq: Every day | NASAL | 3 refills | Status: DC

## 2016-12-20 NOTE — Telephone Encounter (Signed)
Pt brought in a letter asking for refills and to be notified when done.

## 2016-12-20 NOTE — Telephone Encounter (Signed)
Spoke to pt. Rxs sent electronically.

## 2016-12-20 NOTE — Patient Instructions (Signed)
Pre visit review using our clinic review tool, if applicable. No additional management support is needed unless otherwise documented below in the visit note. 

## 2016-12-23 ENCOUNTER — Ambulatory Visit: Payer: Medicare Other

## 2016-12-23 ENCOUNTER — Other Ambulatory Visit: Payer: Self-pay | Admitting: Internal Medicine

## 2016-12-28 ENCOUNTER — Other Ambulatory Visit: Payer: Self-pay

## 2016-12-28 NOTE — Telephone Encounter (Signed)
Pt left v/m requesting rx oxycodone apap. Call when ready for pick up. Last printed # 60 on 10/31/16. Last annual 01/07/16 and next annual scheduled at 01/11/17.

## 2016-12-29 MED ORDER — OXYCODONE-ACETAMINOPHEN 5-325 MG PO TABS: 1.0000 | ORAL_TABLET | Freq: Two times a day (BID) | ORAL | 0 refills | Status: DC | PRN

## 2017-01-05 ENCOUNTER — Encounter: Payer: Self-pay | Admitting: Gastroenterology

## 2017-01-11 ENCOUNTER — Encounter: Payer: Self-pay | Admitting: Internal Medicine

## 2017-01-11 ENCOUNTER — Ambulatory Visit (INDEPENDENT_AMBULATORY_CARE_PROVIDER_SITE_OTHER): Payer: Medicare Other | Admitting: Internal Medicine

## 2017-01-11 VITALS — BP 108/78 | HR 63 | Temp 97.8°F | Ht 65.5 in | Wt 180.0 lb

## 2017-01-11 DIAGNOSIS — I10 Essential (primary) hypertension: Secondary | ICD-10-CM

## 2017-01-11 DIAGNOSIS — I481 Persistent atrial fibrillation: Secondary | ICD-10-CM

## 2017-01-11 DIAGNOSIS — Z7189 Other specified counseling: Secondary | ICD-10-CM | POA: Diagnosis not present

## 2017-01-11 DIAGNOSIS — F39 Unspecified mood [affective] disorder: Secondary | ICD-10-CM

## 2017-01-11 DIAGNOSIS — M5136 Other intervertebral disc degeneration, lumbar region: Secondary | ICD-10-CM

## 2017-01-11 DIAGNOSIS — Z0000 Encounter for general adult medical examination without abnormal findings: Secondary | ICD-10-CM | POA: Diagnosis not present

## 2017-01-11 DIAGNOSIS — N400 Enlarged prostate without lower urinary tract symptoms: Secondary | ICD-10-CM | POA: Diagnosis not present

## 2017-01-11 DIAGNOSIS — I4819 Other persistent atrial fibrillation: Secondary | ICD-10-CM

## 2017-01-11 LAB — COMPREHENSIVE METABOLIC PANEL
ALT: 28 U/L (ref 0–53)
AST: 38 U/L — ABNORMAL HIGH (ref 0–37)
Albumin: 4.8 g/dL (ref 3.5–5.2)
Alkaline Phosphatase: 66 U/L (ref 39–117)
BUN: 15 mg/dL (ref 6–23)
CO2: 33 mEq/L — ABNORMAL HIGH (ref 19–32)
Calcium: 9.8 mg/dL (ref 8.4–10.5)
Chloride: 99 mEq/L (ref 96–112)
Creatinine, Ser: 0.96 mg/dL (ref 0.40–1.50)
GFR: 80.08 mL/min (ref >60.00)
Glucose, Bld: 97 mg/dL (ref 70–99)
Potassium: 4.2 mEq/L (ref 3.5–5.1)
Sodium: 136 mEq/L (ref 135–145)
Total Bilirubin: 0.7 mg/dL (ref 0.2–1.2)
Total Protein: 7.5 g/dL (ref 6.0–8.3)

## 2017-01-11 LAB — LIPID PANEL
Cholesterol: 137 mg/dL (ref 0–200)
HDL: 50.1 mg/dL (ref >39.00)
LDL Cholesterol: 75 mg/dL (ref 0–99)
NonHDL: 86.71
Total CHOL/HDL Ratio: 3
Triglycerides: 60 mg/dL (ref 0.0–149.0)
VLDL: 12 mg/dL (ref 0.0–40.0)

## 2017-01-11 LAB — CBC WITH DIFFERENTIAL/PLATELET
Basophils Absolute: 0 10*3/uL (ref 0.0–0.1)
Basophils Relative: 0.8 % (ref 0.0–3.0)
Eosinophils Absolute: 0.4 10*3/uL (ref 0.0–0.7)
Eosinophils Relative: 7.4 % — ABNORMAL HIGH (ref 0.0–5.0)
HCT: 45.1 % (ref 39.0–52.0)
Hemoglobin: 15.6 g/dL (ref 13.0–17.0)
Lymphocytes Relative: 32.1 % (ref 12.0–46.0)
Lymphs Abs: 1.7 10*3/uL (ref 0.7–4.0)
MCHC: 34.6 g/dL (ref 30.0–36.0)
MCV: 91.5 fl (ref 78.0–100.0)
Monocytes Absolute: 0.5 10*3/uL (ref 0.1–1.0)
Monocytes Relative: 8.7 % (ref 3.0–12.0)
Neutro Abs: 2.7 10*3/uL (ref 1.4–7.7)
Neutrophils Relative %: 51 % (ref 43.0–77.0)
Platelets: 227 10*3/uL (ref 150.0–400.0)
RBC: 4.93 Mil/uL (ref 4.22–5.81)
RDW: 13 % (ref 11.5–15.5)
WBC: 5.2 10*3/uL (ref 4.0–10.5)

## 2017-01-11 MED ORDER — TAMSULOSIN HCL 0.4 MG PO CAPS: 0.4000 mg | ORAL_CAPSULE | Freq: Every day | ORAL | 3 refills | Status: DC

## 2017-01-11 MED ORDER — MONTELUKAST SODIUM 10 MG PO TABS: 10.0000 mg | ORAL_TABLET | Freq: Every day | ORAL | 3 refills | Status: DC

## 2017-01-11 MED ORDER — AZELASTINE HCL 0.1 % NA SOLN: 2.0000 | Freq: Every day | NASAL | 3 refills | Status: DC

## 2017-01-11 MED ORDER — WARFARIN SODIUM 5 MG PO TABS: ORAL_TABLET | ORAL | 3 refills | Status: DC

## 2017-01-11 MED ORDER — OMEPRAZOLE 40 MG PO CPDR: 40.0000 mg | DELAYED_RELEASE_CAPSULE | Freq: Every day | ORAL | 3 refills | Status: DC

## 2017-01-11 MED ORDER — HYDROCHLOROTHIAZIDE 25 MG PO TABS: 25.0000 mg | ORAL_TABLET | Freq: Every day | ORAL | 3 refills | Status: DC

## 2017-01-11 MED ORDER — MELOXICAM 7.5 MG PO TABS: 7.5000 mg | ORAL_TABLET | Freq: Every day | ORAL | 3 refills | Status: DC

## 2017-01-11 NOTE — Progress Notes (Signed)
Pre visit review using our clinic review tool, if applicable. No additional management support is needed unless otherwise documented below in the visit note. 

## 2017-01-11 NOTE — Assessment & Plan Note (Signed)
I have personally reviewed the Medicare Annual Wellness questionnaire and have noted 1. The patient's medical and social history 2. Their use of alcohol, tobacco or illicit drugs 3. Their current medications and supplements 4. The patient's functional ability including ADL's, fall risks, home safety risks and hearing or visual             impairment. 5. Diet and physical activities 6. Evidence for depression or mood disorders  The patients weight, height, BMI and visual acuity have been recorded in the chart I have made referrals, counseling and provided education to the patient based review of the above and I have provided the pt with a written personalized care plan for preventive services.  I have provided you with a copy of your personalized plan for preventive services. Please take the time to review along with your updated medication list.  Keeps in shape No cancer screening due to age UTD on imms

## 2017-01-11 NOTE — Assessment & Plan Note (Signed)
Rate fine on lower atenolol dose On coumadin

## 2017-01-11 NOTE — Assessment & Plan Note (Signed)
Voiding well on the tamsulosin

## 2017-01-11 NOTE — Assessment & Plan Note (Signed)
BP Readings from Last 3 Encounters:  01/11/17 108/78  11/17/16 140/74  11/04/16 120/62   Running good now--even with the decreased atenolol

## 2017-01-11 NOTE — Assessment & Plan Note (Signed)
See social history Still hasn't done formal papers

## 2017-01-11 NOTE — Assessment & Plan Note (Signed)
Continues on the regular narcotic Checked CSRS---only here (3 Rx in past 6 months)

## 2017-01-11 NOTE — Progress Notes (Signed)
Subjective:    Patient ID: Mark Padilla, male    DOB: Jan 09, 1935, 82 y.o.   MRN: [id number]  HPI Here for Medicare wellness visit and follow up of chronic health conditions Reviewed form and advanced directives Reviewed other doctors Occasional drink if alcohol No tobacco Tries to exercise regularly No depression or anhedonia Vision is a problem--- trouble with prescriptions Hearing is poor--concerned about cerumen Had 1 fall --no injury Independent with instrumental ADLs No significant memory problems  Having pain in his joints--- "I ache all the time" Ongoing anxiety and stress--he relates to "not being able to do the things I used to" Has had to moderate his exercise--- but still walks 3 miles a day Does get back pain from disc--and into hip Stiff after walking Uses the oxycodone in AM--and that will help him loosen up for the day. Rarely after that Also uses the meloxicam regularly  Ongoing follow up with Dr Rockey Situ No new problems No palpitations No chest pain or SOB--exercise tolerance is stable Atenolol dose cut in half due to bradycardia  Voids okay on tamsulosin Often does not have nocturia No sig daytime issues  Current Outpatient Prescriptions on File Prior to Visit  Medication Sig Dispense Refill  . ALPRAZolam (XANAX) 1 MG tablet Take 1 tablet (1 mg total) by mouth 2 (two) times daily as needed. 180 tablet 0  . Ascorbic Acid (VITAMIN C) 500 MG tablet Take 500 mg by mouth daily.      Marland Kitchen atenolol (TENORMIN) 25 MG tablet Take 1 tablet (25 mg total) by mouth daily. 90 tablet 3  . atorvastatin (LIPITOR) 10 MG tablet Take 1 tablet (10 mg total) by mouth daily. 90 tablet 3  . azelastine (ASTELIN) 0.1 % nasal spray Place 2 sprays into both nostrils at bedtime. 90 mL 3  . [facility] SENNA PLUS 8.6-50 MG per tablet TAKE 2 TABLETS BY MOUTH TWICE A DAY 180 tablet 7  . fluticasone (FLONASE) 50 MCG/ACT nasal spray Place 2 sprays into both nostrils daily. 48 g 3  . gabapentin  (NEURONTIN) 300 MG capsule Take 1 capsule (300 mg total) by mouth 3 (three) times daily. 270 capsule 3  . hydrochlorothiazide (HYDRODIURIL) 25 MG tablet TAKE 1 TABLET BY MOUTH  DAILY 90 tablet 3  . loratadine (CLARITIN) 10 MG tablet Take 1 tablet (10 mg total) by mouth daily. 90 tablet 3  . meloxicam (MOBIC) 7.5 MG tablet TAKE 1 TABLET BY MOUTH  DAILY 90 tablet 3  . montelukast (SINGULAIR) 10 MG tablet TAKE 1 TABLET BY MOUTH AT  BEDTIME 90 tablet 3  . Multiple Vitamin (MULTIVITAMIN) capsule Take 1 capsule by mouth daily.      Marland Kitchen omeprazole (PRILOSEC) 40 MG capsule TAKE 1 CAPSULE BY MOUTH  DAILY 90 capsule 3  . oxyCODONE-acetaminophen (PERCOCET/ROXICET) 5-325 MG tablet Take 1 tablet by mouth 2 (two) times daily as needed. 60 tablet 0  . Potassium 75 MG TABS Take by mouth daily.      . tamsulosin (FLOMAX) 0.4 MG CAPS capsule TAKE 1 CAPSULE BY MOUTH  DAILY 90 capsule 3  . warfarin (COUMADIN) 5 MG tablet TAKE AS DIRECTED BY  COUMADIN CLINIC 90 tablet 3   No current facility-administered medications on file prior to visit.     Allergies  Allergen Reactions  . Simvastatin     Listlessness, fatigue  . Mucinex D [Pseudoephedrine-Guaifenesin Er]   . Undecylenic Acid   . Flexeril [Cyclobenzaprine Hcl] Anxiety  . Skelaxin Anxiety  Past Medical History:  Diagnosis Date  . Allergic rhinitis   . Atrial fibrillation ([place])    chronic persistent, failed DCCV x3 in 2000  . BPH (benign prostatic hypertrophy)   . Cervical disc disease   . Diverticulitis   . ED (erectile dysfunction)   . GERD (gastroesophageal reflux disease)   . Hyperlipidemia   . Hypertension   . Impaired fasting glucose   . Lumbar disc disease   . Osteoarthritis   . Sleep disturbance     Past Surgical History:  Procedure Laterality Date  . CATARACT EXTRACTION, BILATERAL  2014  . COLONOSCOPY  2013  . HEMIARTHROPLASTY SHOULDER FRACTURE  10/2004   left  . JOINT REPLACEMENT  4/12   Left hip--Dr Jefm Zylka  . KNEE  ARTHROSCOPY  06/1998   right Jefm Garrott)  . LUMBAR LAMINECTOMY  06/10/13   [facility]  . REPLACEMENT UNICONDYLAR JOINT KNEE  8/12   Dr Jefm Maestre  . SHOULDER SURGERY  1950's   dislocation  . SHOULDER SURGERY  12/2004   right  . THR--left  4/12   Dr Jefm Forrest    Family History  Problem Relation Age of Onset  . Stomach cancer Mother   . Stroke Father   . Colon polyps Brother   . Colon cancer Neg Hx     Social History   Social History  . Marital status: Divorced    Spouse name: N/A  . Number of children: 2  . Years of education: N/A   Occupational History  . retired, [employer] works part time [employer] Retired   Social History Main Topics  . Smoking status: Former Smoker    Types: Cigarettes    Quit date: 11/21/1962  . Smokeless tobacco: Never Used  . Alcohol use 0.0 oz/week     Comment: occasional  . Drug use: No  . Sexual activity: Not on file   Other Topics Concern  . Not on file   Social History Narrative   Plans to do living will   Will designate daughter [name] as health care POA.    Would accept resuscitation but no prolonged artificial ventilation   Would not want tube feeds if cognitively unaware   Review of Systems Appetite is good Weight up slightly--hard to control with his arthritis Sleeps okay--uses the alprazolam every night (and occasionally in day) Wears seat belt Teeth okay--- full dentures Has rash on penis--itchy. Uses lamisil--that helps No heartburn (or very rare on the PPI) or dysphagia Bowels are fine--no blood in stool     Objective:   Physical Exam  Constitutional: He is oriented to person, place, and time. He appears well-developed and well-nourished. No distress.  HENT:  Mouth/Throat: Oropharynx is clear and moist. No oropharyngeal exudate.  No cerumen  Neck: Neck supple. No thyromegaly present.  Cardiovascular: Normal rate, normal heart sounds and intact distal pulses.  Exam reveals no gallop.   No murmur heard. irregular    Pulmonary/Chest: Effort normal and breath sounds normal. No respiratory distress. He has no wheezes. He has no rales.  Abdominal: Soft. There is no tenderness.  Musculoskeletal: He exhibits no edema or tenderness.  Lymphadenopathy:    He has no cervical adenopathy.  Neurological: He is alert and oriented to person, place, and time.  President--- "Trump, Obama, CLinton--no 1 inbetween" [phone] [id number]-[id number] Recall 2/3   Skin: No rash noted. No erythema.  Psychiatric: He has a normal mood and affect. His behavior is normal.          Assessment &  Plan:

## 2017-01-11 NOTE — Assessment & Plan Note (Signed)
Mild anxiety--not depressed Continues on the alprazolam--once or twice a day

## 2017-01-31 ENCOUNTER — Ambulatory Visit (INDEPENDENT_AMBULATORY_CARE_PROVIDER_SITE_OTHER): Payer: Medicare Other

## 2017-01-31 DIAGNOSIS — I481 Persistent atrial fibrillation: Secondary | ICD-10-CM

## 2017-01-31 DIAGNOSIS — I4819 Other persistent atrial fibrillation: Secondary | ICD-10-CM

## 2017-01-31 DIAGNOSIS — Z5181 Encounter for therapeutic drug level monitoring: Secondary | ICD-10-CM | POA: Diagnosis not present

## 2017-01-31 LAB — POCT INR: INR: 2.9

## 2017-01-31 NOTE — Patient Instructions (Signed)
Pre visit review using our clinic review tool, if applicable. No additional management support is needed unless otherwise documented below in the visit note. 

## 2017-02-13 ENCOUNTER — Other Ambulatory Visit: Payer: Self-pay

## 2017-02-13 MED ORDER — ALPRAZOLAM 1 MG PO TABS: 1.0000 mg | ORAL_TABLET | Freq: Two times a day (BID) | ORAL | 0 refills | Status: DC | PRN

## 2017-02-13 NOTE — Telephone Encounter (Signed)
Left refill on voice mail at pharmacy  

## 2017-02-13 NOTE — Telephone Encounter (Signed)
Pt left /vm requesting refill xanax; last printed # 180 on 08/22/16; last seen annual exam on 01/11/17. [facility].

## 2017-02-13 NOTE — Telephone Encounter (Signed)
Approved:  #[id number] x 0 

## 2017-02-27 ENCOUNTER — Other Ambulatory Visit: Payer: Self-pay

## 2017-02-27 MED ORDER — OXYCODONE-ACETAMINOPHEN 5-325 MG PO TABS: 1.0000 | ORAL_TABLET | Freq: Two times a day (BID) | ORAL | 0 refills | Status: DC | PRN

## 2017-02-27 NOTE — Telephone Encounter (Signed)
Spoke to pt. Rx up front ready for pickup 

## 2017-02-27 NOTE — Telephone Encounter (Signed)
Pt left v/m requesting rx for oxycodone apap. Call when ready for pick up. Last printed # 60 on 12/29/16; last annual 01/11/17.

## 2017-02-28 ENCOUNTER — Encounter: Payer: Self-pay | Admitting: Internal Medicine

## 2017-02-28 ENCOUNTER — Other Ambulatory Visit: Payer: Medicare Other

## 2017-02-28 DIAGNOSIS — Z0283 Encounter for blood-alcohol and blood-drug test: Secondary | ICD-10-CM

## 2017-02-28 DIAGNOSIS — G8929 Other chronic pain: Secondary | ICD-10-CM | POA: Diagnosis not present

## 2017-03-03 LAB — TOXASSURE SELECT 13 (MW), URINE

## 2017-03-14 ENCOUNTER — Ambulatory Visit (INDEPENDENT_AMBULATORY_CARE_PROVIDER_SITE_OTHER): Payer: Medicare Other

## 2017-03-14 DIAGNOSIS — I4819 Other persistent atrial fibrillation: Secondary | ICD-10-CM

## 2017-03-14 DIAGNOSIS — Z5181 Encounter for therapeutic drug level monitoring: Secondary | ICD-10-CM

## 2017-03-14 DIAGNOSIS — I481 Persistent atrial fibrillation: Secondary | ICD-10-CM

## 2017-03-14 LAB — POCT INR: INR: 2

## 2017-03-14 NOTE — Patient Instructions (Signed)
Pre visit review using our clinic review tool, if applicable. No additional management support is needed unless otherwise documented below in the visit note. 

## 2017-04-25 ENCOUNTER — Ambulatory Visit: Payer: Medicare Other

## 2017-04-28 ENCOUNTER — Ambulatory Visit (INDEPENDENT_AMBULATORY_CARE_PROVIDER_SITE_OTHER): Payer: Medicare Other

## 2017-04-28 ENCOUNTER — Other Ambulatory Visit: Payer: Self-pay

## 2017-04-28 DIAGNOSIS — Z5181 Encounter for therapeutic drug level monitoring: Secondary | ICD-10-CM | POA: Diagnosis not present

## 2017-04-28 DIAGNOSIS — I4819 Other persistent atrial fibrillation: Secondary | ICD-10-CM

## 2017-04-28 DIAGNOSIS — I481 Persistent atrial fibrillation: Secondary | ICD-10-CM

## 2017-04-28 LAB — POCT INR: INR: 2.4

## 2017-04-28 MED ORDER — OXYCODONE-ACETAMINOPHEN 5-325 MG PO TABS: 1.0000 | ORAL_TABLET | Freq: Two times a day (BID) | ORAL | 0 refills | Status: DC | PRN

## 2017-04-28 NOTE — Telephone Encounter (Signed)
Spoke to pt. Rx up front ready for pickup 

## 2017-04-28 NOTE — Telephone Encounter (Signed)
While in for coag visit today patient requests a refill on his oxycodone.  Last OV:  01/11/2017 Last Refill:  # 60 on 02/27/17  [facility] lab completed: 02/28/2017

## 2017-04-28 NOTE — Patient Instructions (Signed)
Pre visit review using our clinic review tool, if applicable. No additional management support is needed unless otherwise documented below in the visit note. 

## 2017-06-06 ENCOUNTER — Ambulatory Visit (INDEPENDENT_AMBULATORY_CARE_PROVIDER_SITE_OTHER): Payer: Medicare Other

## 2017-06-06 DIAGNOSIS — Z5181 Encounter for therapeutic drug level monitoring: Secondary | ICD-10-CM | POA: Diagnosis not present

## 2017-06-06 DIAGNOSIS — I481 Persistent atrial fibrillation: Secondary | ICD-10-CM | POA: Diagnosis not present

## 2017-06-06 DIAGNOSIS — I4819 Other persistent atrial fibrillation: Secondary | ICD-10-CM

## 2017-06-06 LAB — POCT INR: INR: 2.4

## 2017-06-06 NOTE — Patient Instructions (Signed)
Pre visit review using our clinic review tool, if applicable. No additional management support is needed unless otherwise documented below in the visit note. 

## 2017-06-27 ENCOUNTER — Other Ambulatory Visit: Payer: Self-pay

## 2017-06-27 NOTE — Telephone Encounter (Signed)
Pt left v/m requesting refill on oxycodone apap. Call when ready for pick up. Pt last seen 01/11/17 for annual. rx last printed # 60 on 04/28/17

## 2017-06-28 MED ORDER — OXYCODONE-ACETAMINOPHEN 5-325 MG PO TABS: 1.0000 | ORAL_TABLET | Freq: Two times a day (BID) | ORAL | 0 refills | Status: DC | PRN

## 2017-06-28 NOTE — Telephone Encounter (Signed)
Spoke to pt and informed him Rx is available for pickup from the front desk 

## 2017-07-18 ENCOUNTER — Ambulatory Visit (INDEPENDENT_AMBULATORY_CARE_PROVIDER_SITE_OTHER): Payer: Medicare Other

## 2017-07-18 DIAGNOSIS — I4819 Other persistent atrial fibrillation: Secondary | ICD-10-CM

## 2017-07-18 DIAGNOSIS — Z5181 Encounter for therapeutic drug level monitoring: Secondary | ICD-10-CM

## 2017-07-18 LAB — POCT INR: INR: 2.1

## 2017-07-18 NOTE — Patient Instructions (Signed)
Pre visit review using our clinic review tool, if applicable. No additional management support is needed unless otherwise documented below in the visit note. 

## 2017-08-01 ENCOUNTER — Other Ambulatory Visit: Payer: Self-pay

## 2017-08-01 MED ORDER — ALPRAZOLAM 1 MG PO TABS: 1.0000 mg | ORAL_TABLET | Freq: Two times a day (BID) | ORAL | 0 refills | Status: DC | PRN

## 2017-08-01 NOTE — Telephone Encounter (Signed)
Pt left v/m requesting refill [facility]. Last refilled # 180 on 02/13/17; last annual 01/11/17.

## 2017-08-01 NOTE — Telephone Encounter (Signed)
Rx faxed to mail order pharmacy as requested

## 2017-08-18 ENCOUNTER — Other Ambulatory Visit: Payer: Self-pay

## 2017-08-18 MED ORDER — OXYCODONE-ACETAMINOPHEN 5-325 MG PO TABS: 1.0000 | ORAL_TABLET | Freq: Two times a day (BID) | ORAL | 0 refills | Status: DC | PRN

## 2017-08-18 NOTE — Telephone Encounter (Signed)
Pt left vm requesting a refill.

## 2017-08-18 NOTE — Telephone Encounter (Signed)
He needs an appointment within the next month for controlled substance visit

## 2017-08-18 NOTE — Telephone Encounter (Signed)
Spoke to pt. Rx up front ready for pickup. He will schedule appt when he comes to get the rx.

## 2017-09-05 ENCOUNTER — Ambulatory Visit (INDEPENDENT_AMBULATORY_CARE_PROVIDER_SITE_OTHER): Payer: Medicare Other

## 2017-09-05 ENCOUNTER — Ambulatory Visit (INDEPENDENT_AMBULATORY_CARE_PROVIDER_SITE_OTHER): Payer: Medicare Other | Admitting: Internal Medicine

## 2017-09-05 ENCOUNTER — Encounter: Payer: Self-pay | Admitting: Internal Medicine

## 2017-09-05 VITALS — BP 138/70 | HR 59 | Temp 97.9°F | Wt 186.0 lb

## 2017-09-05 DIAGNOSIS — L821 Other seborrheic keratosis: Secondary | ICD-10-CM

## 2017-09-05 DIAGNOSIS — F1120 Opioid dependence, uncomplicated: Secondary | ICD-10-CM | POA: Insufficient documentation

## 2017-09-05 DIAGNOSIS — Z5181 Encounter for therapeutic drug level monitoring: Secondary | ICD-10-CM | POA: Diagnosis not present

## 2017-09-05 DIAGNOSIS — Z23 Encounter for immunization: Secondary | ICD-10-CM | POA: Diagnosis not present

## 2017-09-05 DIAGNOSIS — I4819 Other persistent atrial fibrillation: Secondary | ICD-10-CM

## 2017-09-05 LAB — POCT INR: INR: 2.7

## 2017-09-05 MED ORDER — ATENOLOL 25 MG PO TABS
25.0000 mg | ORAL_TABLET | Freq: Every day | ORAL | 3 refills | Status: DC
Start: 2017-09-05 — End: 2018-09-17

## 2017-09-05 MED ORDER — GABAPENTIN 300 MG PO CAPS: 300.0000 mg | ORAL_CAPSULE | Freq: Three times a day (TID) | ORAL | 3 refills | Status: DC

## 2017-09-05 MED ORDER — ATORVASTATIN CALCIUM 10 MG PO TABS: 10.0000 mg | ORAL_TABLET | Freq: Every day | ORAL | 3 refills | Status: DC

## 2017-09-05 MED ORDER — FLUTICASONE PROPIONATE 50 MCG/ACT NA SUSP: 2.0000 | Freq: Every day | NASAL | 3 refills | Status: DC

## 2017-09-05 NOTE — Assessment & Plan Note (Signed)
Reassured No precancerous lesions seen

## 2017-09-05 NOTE — Addendum Note (Signed)
Addended by: Pilar Grammes on: 09/05/2017 12:06 PM   Modules accepted: Orders

## 2017-09-05 NOTE — Progress Notes (Signed)
Subjective:    Patient ID: Mark Padilla, male    DOB: 1935-02-21, 82 y.o.   MRN: [id number]  HPI Here for review of his chronic narcotic use due to pain  Chronic back pain Uses 1-2 of the oxycodone daily--mostly one Pain in all joints---especially knee, hands and shoulders --as well Usually takes it in the morning--- then able to do his normal activities by a short time later Tried off it for a week but had terrible pain  Feels better when he stays active Walks as much as 3.5 miles --though very sore the next day Takes the meloxicam and gabapentin (300AM, 600PM) every day  He has some skin lesions he wants checked Lots of itching for one on left neck and upper left flank  Current Outpatient Prescriptions on File Prior to Visit  Medication Sig Dispense Refill  . ALPRAZolam (XANAX) 1 MG tablet Take 1 tablet (1 mg total) by mouth 2 (two) times daily as needed. 180 tablet 0  . Ascorbic Acid (VITAMIN C) 500 MG tablet Take 500 mg by mouth daily.      Marland Kitchen atenolol (TENORMIN) 25 MG tablet Take 1 tablet (25 mg total) by mouth daily. 90 tablet 3  . atorvastatin (LIPITOR) 10 MG tablet Take 1 tablet (10 mg total) by mouth daily. 90 tablet 3  . azelastine (ASTELIN) 0.1 % nasal spray Place 2 sprays into both nostrils at bedtime. 90 mL 3  . [facility] SENNA PLUS 8.6-50 MG per tablet TAKE 2 TABLETS BY MOUTH TWICE A DAY 180 tablet 7  . fluticasone (FLONASE) 50 MCG/ACT nasal spray Place 2 sprays into both nostrils daily. 48 g 3  . gabapentin (NEURONTIN) 300 MG capsule Take 1 capsule (300 mg total) by mouth 3 (three) times daily. 270 capsule 3  . hydrochlorothiazide (HYDRODIURIL) 25 MG tablet Take 1 tablet (25 mg total) by mouth daily. 90 tablet 3  . loratadine (CLARITIN) 10 MG tablet Take 1 tablet (10 mg total) by mouth daily. 90 tablet 3  . meloxicam (MOBIC) 7.5 MG tablet Take 1 tablet (7.5 mg total) by mouth daily. 90 tablet 3  . montelukast (SINGULAIR) 10 MG tablet Take 1 tablet (10 mg total) by mouth  at bedtime. 90 tablet 3  . Multiple Vitamin (MULTIVITAMIN) capsule Take 1 capsule by mouth daily.      Marland Kitchen omeprazole (PRILOSEC) 40 MG capsule Take 1 capsule (40 mg total) by mouth daily. 90 capsule 3  . oxyCODONE-acetaminophen (PERCOCET/ROXICET) 5-325 MG tablet Take 1 tablet by mouth 2 (two) times daily as needed. 60 tablet 0  . Potassium 75 MG TABS Take by mouth daily.      . tamsulosin (FLOMAX) 0.4 MG CAPS capsule Take 1 capsule (0.4 mg total) by mouth daily. 90 capsule 3  . warfarin (COUMADIN) 5 MG tablet TAKE AS DIRECTED BY  COUMADIN CLINIC 90 tablet 3   No current facility-administered medications on file prior to visit.     Allergies  Allergen Reactions  . Simvastatin     Listlessness, fatigue  . Mucinex D [Pseudoephedrine-Guaifenesin Er]   . Undecylenic Acid   . Flexeril [Cyclobenzaprine Hcl] Anxiety  . Skelaxin Anxiety    Past Medical History:  Diagnosis Date  . Allergic rhinitis   . Atrial fibrillation ([place])    chronic persistent, failed DCCV x3 in 2000  . BPH (benign prostatic hypertrophy)   . Cervical disc disease   . Diverticulitis   . ED (erectile dysfunction)   . GERD (gastroesophageal reflux disease)   .  Hyperlipidemia   . Hypertension   . Impaired fasting glucose   . Lumbar disc disease   . Osteoarthritis   . Sleep disturbance     Past Surgical History:  Procedure Laterality Date  . CATARACT EXTRACTION, BILATERAL  2014  . COLONOSCOPY  2013  . HEMIARTHROPLASTY SHOULDER FRACTURE  10/2004   left  . JOINT REPLACEMENT  4/12   Left hip--Dr Jefm Muzzy  . KNEE ARTHROSCOPY  06/1998   right Jefm Thon)  . LUMBAR LAMINECTOMY  06/10/13   [facility]  . REPLACEMENT UNICONDYLAR JOINT KNEE  8/12   Dr Jefm Oriordan  . SHOULDER SURGERY  1950's   dislocation  . SHOULDER SURGERY  12/2004   right  . THR--left  4/12   Dr Jefm Cannata    Family History  Problem Relation Age of Onset  . Stomach cancer Mother   . Stroke Father   . Colon polyps Brother   . Colon cancer Neg Hx      Social History   Social History  . Marital status: Divorced    Spouse name: N/A  . Number of children: 2  . Years of education: N/A   Occupational History  . retired, [employer] works part time [employer] Retired   Social History Main Topics  . Smoking status: Former Smoker    Types: Cigarettes    Quit date: 11/21/1962  . Smokeless tobacco: Never Used  . Alcohol use 0.0 oz/week     Comment: occasional  . Drug use: No  . Sexual activity: Not on file   Other Topics Concern  . Not on file   Social History Narrative   Plans to do living will   Will designate daughter [name] as health care POA.    Would accept resuscitation but no prolonged artificial ventilation   Would not want tube feeds if cognitively unaware   Review of Systems Sleeps fairly well --needs the alprazolam--and does deep breathing exercises Appetite is good    Objective:   Physical Exam  Constitutional: He appears well-nourished. No distress.  Skin:  Several large seb keratosis on back/chest Small skin tag on left neck Small hypopigmented spot on mid left flank (looks like remnants of seb keratosis that was scratched off)  Psychiatric: He has a normal mood and affect. His behavior is normal.          Assessment & Plan:

## 2017-09-05 NOTE — Assessment & Plan Note (Signed)
Chronic pain in back and generalized osteoarthritis Uses 1 oxycodone most days--helps him stay active meloxicam and gabapentin (can try weaning this as tolerated) CSRS reviewed--no concerns

## 2017-09-06 ENCOUNTER — Ambulatory Visit: Payer: Medicare Other | Admitting: Internal Medicine

## 2017-09-06 NOTE — Patient Instructions (Signed)
Pre visit review using our clinic review tool, if applicable. No additional management support is needed unless otherwise documented below in the visit note. 

## 2017-10-17 ENCOUNTER — Ambulatory Visit: Payer: Medicare Other

## 2017-10-17 DIAGNOSIS — Z5181 Encounter for therapeutic drug level monitoring: Secondary | ICD-10-CM

## 2017-10-17 DIAGNOSIS — Z7901 Long term (current) use of anticoagulants: Secondary | ICD-10-CM

## 2017-10-17 DIAGNOSIS — I4819 Other persistent atrial fibrillation: Secondary | ICD-10-CM

## 2017-10-17 LAB — POCT INR: INR: 2.6

## 2017-10-17 NOTE — Patient Instructions (Signed)
INR today 2.6  Continue taking 5 mg daily. Recheck in 6 weeks.    Patient overall doing very well, without any changes in health, medications or general health.

## 2017-10-26 ENCOUNTER — Other Ambulatory Visit: Payer: Self-pay | Admitting: Internal Medicine

## 2017-10-26 MED ORDER — OXYCODONE-ACETAMINOPHEN 5-325 MG PO TABS: 1.0000 | ORAL_TABLET | Freq: Two times a day (BID) | ORAL | 0 refills | Status: DC | PRN

## 2017-10-26 NOTE — Telephone Encounter (Signed)
Last OV 08/2017. Last Rx 08/18/2017

## 2017-10-26 NOTE — Telephone Encounter (Signed)
Oxycodone refill. Last OV 09/05/17. Last refill 08/18/17

## 2017-10-26 NOTE — Telephone Encounter (Signed)
Copied from [place]. Topic: Quick Communication - See Telephone Encounter >> Oct 26, 2017 11:08 AM Bea Graff, NT wrote: CRM for notification. See Telephone encounter for: Pt needing refill of oxycodone. Please call when ready for pick up.   10/26/17.

## 2017-10-26 NOTE — Telephone Encounter (Signed)
Spoke to pt. Rx up front ready for pickup 

## 2017-11-28 ENCOUNTER — Ambulatory Visit: Payer: Medicare Other

## 2017-11-30 ENCOUNTER — Other Ambulatory Visit: Payer: Self-pay

## 2017-11-30 ENCOUNTER — Ambulatory Visit (INDEPENDENT_AMBULATORY_CARE_PROVIDER_SITE_OTHER): Payer: Medicare Other | Admitting: General Practice

## 2017-11-30 DIAGNOSIS — Z7901 Long term (current) use of anticoagulants: Secondary | ICD-10-CM | POA: Diagnosis not present

## 2017-11-30 DIAGNOSIS — Z5181 Encounter for therapeutic drug level monitoring: Secondary | ICD-10-CM

## 2017-11-30 LAB — POCT INR: INR: 2.3

## 2017-11-30 MED ORDER — ALPRAZOLAM 1 MG PO TABS: 1.0000 mg | ORAL_TABLET | Freq: Two times a day (BID) | ORAL | 0 refills | Status: DC | PRN

## 2017-11-30 NOTE — Telephone Encounter (Signed)
Approved:  #[id number] x 0 

## 2017-11-30 NOTE — Telephone Encounter (Signed)
Fax form for [facility] placed in Dr Alla German INbox on his desk to sign and fax back to [facility] for the refill.  Last refill 08-01-17 Last OV 09-05-17 Next OV 01-16-18

## 2017-11-30 NOTE — Patient Instructions (Addendum)
Pre visit review using our clinic review tool, if applicable. No additional management support is needed unless otherwise documented below in the visit note.  Continue taking 5 mg daily. Recheck in 6 weeks.

## 2017-11-30 NOTE — Telephone Encounter (Signed)
rx faxed to [facility].  

## 2017-12-01 DIAGNOSIS — H35373 Puckering of macula, bilateral: Secondary | ICD-10-CM | POA: Diagnosis not present

## 2017-12-01 DIAGNOSIS — H52223 Regular astigmatism, bilateral: Secondary | ICD-10-CM | POA: Diagnosis not present

## 2017-12-01 DIAGNOSIS — H524 Presbyopia: Secondary | ICD-10-CM | POA: Diagnosis not present

## 2017-12-01 DIAGNOSIS — H5203 Hypermetropia, bilateral: Secondary | ICD-10-CM | POA: Diagnosis not present

## 2017-12-23 NOTE — Progress Notes (Signed)
Cardiology Office Note  Date:  12/25/2017   ID:  SHASHANK KWASNIK, DOB 05-Nov-1935, MRN [id number]  PCP:  Venia Carbon, MD   Chief Complaint  Patient presents with  . OTHER    12 month f/u. Meds reviewed verbally with pt.    HPI:  83 year-old gentleman with PMH of  chronic atrial fibrillation on warfarin,  HTN,  DC cardioversion that was unsuccessful,  hyperlipidemia,  hip replacement on the left in April 2012,  right knee replacement August 2012 back surgery July 2014 with decompression  previously on Zocor and this was stopped after he had significant weight loss, malaise Changed to Lipitor He presents for routine follow-up of his atrial fibrillation  herniated disk, L3 to 4 Seen previously at [facility], Dr. Benson Setting Sometimes does not want to do anything, pain is bad Gaining weight Still trying to walk, hurts after he finishes walking, 30 min later  Denies any chest pain concerning for angina, no shortness of breath  Labs reviewed Total cholesterol 138, LDL 76 Tolerating his Lipitor  EKG on today's visit shows atrial fibrillation with ventricular rate 68  bpm, no significant ST or T-wave changes   Other past medical history He reports he is doing better since his low back surgery in July 2014. He is walking 30 minutes per day. Recent upper respiratory infection. Took Mucinex D  and it and had malaise, palpitations. Took in one week to get over this feeling. Now feels back to normal.  no complaints on low-dose Lipitor   He denies chest pain, shortness of breath, edema, or palpitations. no lightheadedness or near syncope.     PMH:   has a past medical history of Allergic rhinitis, Atrial fibrillation (HCC), BPH (benign prostatic hypertrophy), Cervical disc disease, Diverticulitis, ED (erectile dysfunction), GERD (gastroesophageal reflux disease), Hyperlipidemia, Hypertension, Impaired fasting glucose, Lumbar disc disease, Osteoarthritis, and Sleep disturbance.  PSH:     Past Surgical History:  Procedure Laterality Date  . CATARACT EXTRACTION, BILATERAL  2014  . COLONOSCOPY  2013  . HEMIARTHROPLASTY SHOULDER FRACTURE  10/2004   left  . JOINT REPLACEMENT  4/12   Left hip--Dr Jefm Laino  . KNEE ARTHROSCOPY  06/1998   right Jefm Marcom)  . LUMBAR LAMINECTOMY  06/10/13   [facility]  . REPLACEMENT UNICONDYLAR JOINT KNEE  8/12   Dr Jefm Narine  . SHOULDER SURGERY  1950's   dislocation  . SHOULDER SURGERY  12/2004   right  . THR--left  4/12   Dr Jefm Verdell    Current Outpatient Medications  Medication Sig Dispense Refill  . ALPRAZolam (XANAX) 1 MG tablet Take 1 tablet (1 mg total) by mouth 2 (two) times daily as needed. 180 tablet 0  . Ascorbic Acid (VITAMIN C) 500 MG tablet Take 500 mg by mouth daily.      Marland Kitchen atenolol (TENORMIN) 25 MG tablet Take 1 tablet (25 mg total) by mouth daily. 90 tablet 3  . atorvastatin (LIPITOR) 10 MG tablet Take 1 tablet (10 mg total) by mouth daily. 90 tablet 3  . azelastine (ASTELIN) 0.1 % nasal spray Place 2 sprays into both nostrils at bedtime. 90 mL 3  . [facility] SENNA PLUS 8.6-50 MG per tablet TAKE 2 TABLETS BY MOUTH TWICE A DAY 180 tablet 7  . fluticasone (FLONASE) 50 MCG/ACT nasal spray Place 2 sprays into both nostrils daily. 48 g 3  . gabapentin (NEURONTIN) 300 MG capsule Take 1 capsule (300 mg total) by mouth 3 (three) times daily. 270 capsule 3  .  hydrochlorothiazide (HYDRODIURIL) 25 MG tablet Take 1 tablet (25 mg total) by mouth daily. 90 tablet 3  . loratadine (CLARITIN) 10 MG tablet Take 1 tablet (10 mg total) by mouth daily. 90 tablet 3  . meloxicam (MOBIC) 7.5 MG tablet Take 1 tablet (7.5 mg total) by mouth daily. 90 tablet 3  . montelukast (SINGULAIR) 10 MG tablet Take 1 tablet (10 mg total) by mouth at bedtime. 90 tablet 3  . Multiple Vitamin (MULTIVITAMIN) capsule Take 1 capsule by mouth daily.      Marland Kitchen omeprazole (PRILOSEC) 40 MG capsule Take 1 capsule (40 mg total) by mouth daily. 90 capsule 3  . oxyCODONE-acetaminophen  (PERCOCET/ROXICET) 5-325 MG tablet Take 1 tablet by mouth 2 (two) times daily as needed. 60 tablet 0  . Potassium 75 MG TABS Take by mouth daily.      . tamsulosin (FLOMAX) 0.4 MG CAPS capsule Take 1 capsule (0.4 mg total) by mouth daily. 90 capsule 3  . warfarin (COUMADIN) 5 MG tablet TAKE AS DIRECTED BY  COUMADIN CLINIC 90 tablet 3   No current facility-administered medications for this visit.      Allergies:   Simvastatin; Mucinex d [pseudoephedrine-guaifenesin er]; Undecylenic acid; Flexeril [cyclobenzaprine hcl]; and Skelaxin   Social History:  The patient  reports that he quit smoking about 55 years ago. His smoking use included cigarettes. he has never used smokeless tobacco. He reports that he drinks alcohol. He reports that he does not use drugs.   Family History:   family history includes Colon polyps in his brother; Stomach cancer in his mother; Stroke in his father.    Review of Systems: Review of Systems  Constitutional: Negative.        Weight gain  Respiratory: Negative.   Cardiovascular: Negative.   Gastrointestinal: Negative.   Musculoskeletal: Positive for back pain and joint pain.  Neurological: Negative.   Psychiatric/Behavioral: Negative.   All other systems reviewed and are negative.    PHYSICAL EXAM: VS:  BP 130/60 (BP Location: Left Arm, Patient Position: Sitting, Cuff Size: Normal)   Pulse 64   Ht 5\' 7"  (1.702 m)   Wt 191 lb 4 oz (86.8 kg)   BMI 29.95 kg/m  , BMI Body mass index is 29.95 kg/m. Constitutional:  oriented to person, place, and time. No distress.  HENT:  Head: Normocephalic and atraumatic.  Eyes:  no discharge. No scleral icterus.  Neck: Normal range of motion. Neck supple. No JVD present.  Cardiovascular: Irregularly irregular,   and intact distal pulses. Exam reveals no gallop and no friction rub. No edema No murmur heard. Pulmonary/Chest: Effort normal and breath sounds normal. No stridor. No respiratory distress.  no wheezes.  no  rales.  no tenderness.  Abdominal: Soft.  no distension.  no tenderness.  Musculoskeletal: Normal range of motion.  no  tenderness or deformity.  Neurological:  normal muscle tone. Coordination normal. No atrophy Skin: Skin is warm and dry. No rash noted. not diaphoretic.  Psychiatric:  normal mood and affect. behavior is normal. Thought content normal.  We did talk about changing to a  Recent Labs: 01/11/2017: ALT 28; BUN 15; Creatinine, Ser 0.96; Hemoglobin 15.6; Platelets 227.0; Potassium 4.2; Sodium 136    Lipid Panel Lab Results  Component Value Date   CHOL 137 01/11/2017   HDL 50.10 01/11/2017   LDLCALC 75 01/11/2017   TRIG 60.0 01/11/2017      Wt Readings from Last 3 Encounters:  12/25/17 191 lb 4 oz (86.8  kg)  09/05/17 186 lb (84.4 kg)  01/11/17 180 lb (81.6 kg)     ASSESSMENT AND PLAN:  Atrial fibrillation, unspecified type ([place]) - Plan: EKG 12-Lead Rate well controlled, tolerating warfarin With did talk about changing to a NOAC, He is not particularly interested at this time Reports has been on warfarin for 30 years managed well atenolol 12.5 mg daily  Mixed hyperlipidemia Cholesterol is at goal on the current lipid regimen. No changes to the medications were made.  Overeating at times, weight continues to trend up over the past 2 years Unable to exercise secondary to chronic back pain  Chronic back pain Long discussion with him, previously had laminectomy at [facility] Dr. Delilah Shan he has retired, requesting new names for evaluation Back pain getting worse, less exercise, more weight gain  Still trying to walk but has pain 30 minutes later and bad in the mornings  Essential hypertension Blood pressure is well controlled on today's visit. No changes made to the medications. Stable   Total encounter time more than 25 minutes  Greater than 50% was spent in counseling and coordination of care with the patient   Disposition:   F/U  12 months   Orders Placed This  Encounter  Procedures  . EKG 12-Lead     Signed, Esmond Plants, M.D., Ph.D. 12/25/2017  [place], [place]

## 2017-12-25 ENCOUNTER — Other Ambulatory Visit: Payer: Self-pay | Admitting: Internal Medicine

## 2017-12-25 ENCOUNTER — Encounter: Payer: Self-pay | Admitting: Cardiovascular Disease

## 2017-12-25 ENCOUNTER — Ambulatory Visit: Payer: Medicare Other | Admitting: Cardiovascular Disease

## 2017-12-25 VITALS — BP 130/60 | HR 64 | Ht 67.0 in | Wt 191.2 lb

## 2017-12-25 DIAGNOSIS — I482 Chronic atrial fibrillation: Secondary | ICD-10-CM | POA: Diagnosis not present

## 2017-12-25 DIAGNOSIS — I10 Essential (primary) hypertension: Secondary | ICD-10-CM

## 2017-12-25 DIAGNOSIS — E782 Mixed hyperlipidemia: Secondary | ICD-10-CM | POA: Diagnosis not present

## 2017-12-25 DIAGNOSIS — I4821 Permanent atrial fibrillation: Secondary | ICD-10-CM

## 2017-12-25 MED ORDER — OXYCODONE-ACETAMINOPHEN 5-325 MG PO TABS: 1.0000 | ORAL_TABLET | Freq: Two times a day (BID) | ORAL | 0 refills | Status: DC | PRN

## 2017-12-25 NOTE — Patient Instructions (Signed)

## 2017-12-25 NOTE — Telephone Encounter (Signed)
Spoke to pt. Rx sent to pharmacy. 

## 2017-12-25 NOTE — Telephone Encounter (Signed)
  Pt requesting refill Hydrocodone apap Last refilled and qty;# 60 on 10/26/2017 Last seen:09/05/17 Pharmacy:[facility]. UDS: 02/28/17

## 2017-12-25 NOTE — Telephone Encounter (Signed)
Copied from [place] [id number]. Topic: Quick Communication - Rx Refill/Question >> Dec 25, 2017 11:05 AM Scherrie Gerlach wrote: Medication:   oxyCODONE-acetaminophen (PERCOCET/ROXICET) 5-325 MG tablet  [id number] [address], [place] [phone] (Phone) [phone] (Fax)

## 2018-01-02 DIAGNOSIS — J40 Bronchitis, not specified as acute or chronic: Secondary | ICD-10-CM | POA: Diagnosis not present

## 2018-01-11 ENCOUNTER — Telehealth: Payer: Self-pay

## 2018-01-11 DIAGNOSIS — I4821 Permanent atrial fibrillation: Secondary | ICD-10-CM

## 2018-01-11 DIAGNOSIS — Z7901 Long term (current) use of anticoagulants: Secondary | ICD-10-CM

## 2018-01-11 NOTE — Telephone Encounter (Signed)
Spoke with patient to reschedule his coumadin clinic appointment on 2/26.  He informed me while on the phone that he has had bronchitis and been on doxycycline.  He has finished abx therapy now.    Patient will be seeing Dr. Silvio Pate for physical on 01/16/18.  I will have Dr send patient around to the lab for PT/INR that day and we will call patient with results.  I will be out of the office on Monday 2/25 and Tuesday 2/26 and will inform lab and PCP to please manage result as needed until I return.    I have placed order for PT/INR for 2/26.  Thanks.

## 2018-01-11 NOTE — Telephone Encounter (Signed)
Made a note for myself

## 2018-01-11 NOTE — Telephone Encounter (Signed)
Okay Let's make a note of this

## 2018-01-15 ENCOUNTER — Other Ambulatory Visit: Payer: Self-pay | Admitting: Internal Medicine

## 2018-01-16 ENCOUNTER — Ambulatory Visit (INDEPENDENT_AMBULATORY_CARE_PROVIDER_SITE_OTHER): Payer: Medicare Other | Admitting: Internal Medicine

## 2018-01-16 ENCOUNTER — Ambulatory Visit: Payer: Self-pay

## 2018-01-16 ENCOUNTER — Other Ambulatory Visit (INDEPENDENT_AMBULATORY_CARE_PROVIDER_SITE_OTHER): Payer: Medicare Other

## 2018-01-16 ENCOUNTER — Encounter: Payer: Self-pay | Admitting: Internal Medicine

## 2018-01-16 ENCOUNTER — Ambulatory Visit: Payer: Medicare Other

## 2018-01-16 VITALS — BP 130/82 | HR 45 | Temp 97.7°F | Ht 66.0 in | Wt 182.5 lb

## 2018-01-16 DIAGNOSIS — I482 Chronic atrial fibrillation: Secondary | ICD-10-CM

## 2018-01-16 DIAGNOSIS — I4821 Permanent atrial fibrillation: Secondary | ICD-10-CM

## 2018-01-16 DIAGNOSIS — I10 Essential (primary) hypertension: Secondary | ICD-10-CM

## 2018-01-16 DIAGNOSIS — Z23 Encounter for immunization: Secondary | ICD-10-CM | POA: Diagnosis not present

## 2018-01-16 DIAGNOSIS — I4891 Unspecified atrial fibrillation: Secondary | ICD-10-CM | POA: Diagnosis not present

## 2018-01-16 DIAGNOSIS — Z7189 Other specified counseling: Secondary | ICD-10-CM | POA: Diagnosis not present

## 2018-01-16 DIAGNOSIS — Z0000 Encounter for general adult medical examination without abnormal findings: Secondary | ICD-10-CM | POA: Diagnosis not present

## 2018-01-16 DIAGNOSIS — F1120 Opioid dependence, uncomplicated: Secondary | ICD-10-CM

## 2018-01-16 DIAGNOSIS — Z5181 Encounter for therapeutic drug level monitoring: Secondary | ICD-10-CM

## 2018-01-16 DIAGNOSIS — F39 Unspecified mood [affective] disorder: Secondary | ICD-10-CM | POA: Diagnosis not present

## 2018-01-16 LAB — COMPREHENSIVE METABOLIC PANEL
ALT: 19 U/L (ref 0–53)
AST: 29 U/L (ref 0–37)
Albumin: 4.3 g/dL (ref 3.5–5.2)
Alkaline Phosphatase: 63 U/L (ref 39–117)
BUN: 15 mg/dL (ref 6–23)
CO2: 31 mEq/L (ref 19–32)
Calcium: 9.9 mg/dL (ref 8.4–10.5)
Chloride: 99 mEq/L (ref 96–112)
Creatinine, Ser: 0.84 mg/dL (ref 0.40–1.50)
GFR: 93.18 mL/min (ref >60.00)
Glucose, Bld: 77 mg/dL (ref 70–99)
Potassium: 4.1 mEq/L (ref 3.5–5.1)
Sodium: 135 mEq/L (ref 135–145)
Total Bilirubin: 0.6 mg/dL (ref 0.2–1.2)
Total Protein: 7.3 g/dL (ref 6.0–8.3)

## 2018-01-16 LAB — CBC
HCT: 44.2 % (ref 39.0–52.0)
Hemoglobin: 15.1 g/dL (ref 13.0–17.0)
MCHC: 34.2 g/dL (ref 30.0–36.0)
MCV: 91.8 fl (ref 78.0–100.0)
Platelets: 241 10*3/uL (ref 150.0–400.0)
RBC: 4.82 Mil/uL (ref 4.22–5.81)
RDW: 13.6 % (ref 11.5–15.5)
WBC: 4.9 10*3/uL (ref 4.0–10.5)

## 2018-01-16 LAB — LIPID PANEL
Cholesterol: 132 mg/dL (ref 0–200)
HDL: 46 mg/dL (ref >39.00)
LDL Cholesterol: 71 mg/dL (ref 0–99)
NonHDL: 85.65
Total CHOL/HDL Ratio: 3
Triglycerides: 74 mg/dL (ref 0.0–149.0)
VLDL: 14.8 mg/dL (ref 0.0–40.0)

## 2018-01-16 LAB — T4, FREE: Free T4: 0.75 ng/dL (ref 0.60–1.60)

## 2018-01-16 LAB — POCT INR: INR: 2.2

## 2018-01-16 MED ORDER — MELOXICAM 7.5 MG PO TABS: 7.5000 mg | ORAL_TABLET | Freq: Every day | ORAL | 3 refills | Status: DC

## 2018-01-16 MED ORDER — WARFARIN SODIUM 5 MG PO TABS: ORAL_TABLET | ORAL | 3 refills | Status: DC

## 2018-01-16 MED ORDER — OMEPRAZOLE 40 MG PO CPDR: 40.0000 mg | DELAYED_RELEASE_CAPSULE | Freq: Every day | ORAL | 3 refills | Status: DC

## 2018-01-16 MED ORDER — HYDROCHLOROTHIAZIDE 25 MG PO TABS: 25.0000 mg | ORAL_TABLET | Freq: Every day | ORAL | 3 refills | Status: DC

## 2018-01-16 MED ORDER — MONTELUKAST SODIUM 10 MG PO TABS: 10.0000 mg | ORAL_TABLET | Freq: Every day | ORAL | 3 refills | Status: DC

## 2018-01-16 MED ORDER — TAMSULOSIN HCL 0.4 MG PO CAPS: 0.4000 mg | ORAL_CAPSULE | Freq: Every day | ORAL | 3 refills | Status: DC

## 2018-01-16 NOTE — Assessment & Plan Note (Signed)
Fills the oxycodone every 2 months generally CSRS fine Will recheck UDS

## 2018-01-16 NOTE — Assessment & Plan Note (Signed)
I have personally reviewed the Medicare Annual Wellness questionnaire and have noted 1. The patient's medical and social history 2. Their use of alcohol, tobacco or illicit drugs 3. Their current medications and supplements 4. The patient's functional ability including ADL's, fall risks, home safety risks and hearing or visual             impairment. 5. Diet and physical activities 6. Evidence for depression or mood disorders  The patients weight, height, BMI and visual acuity have been recorded in the chart I have made referrals, counseling and provided education to the patient based review of the above and I have provided the pt with a written personalized care plan for preventive services.  I have provided you with a copy of your personalized plan for preventive services. Please take the time to review along with your updated medication list.  Will give pneumovax booster No cancer screening due to age [place] regularly

## 2018-01-16 NOTE — Assessment & Plan Note (Signed)
BP Readings from Last 3 Encounters:  01/16/18 130/82  12/25/17 130/60  09/05/17 138/70   Good control

## 2018-01-16 NOTE — Addendum Note (Signed)
Addended by: Pilar Grammes on: 01/16/2018 11:48 AM   Modules accepted: Orders

## 2018-01-16 NOTE — Assessment & Plan Note (Signed)
Nothing formal but see social history

## 2018-01-16 NOTE — Progress Notes (Signed)
Subjective:    Patient ID: Mark Padilla, male    DOB: [birth year], 83 y.o.   MRN: [id number]  HPI Here for Medicare wellness visit and follow up of chronic medical problems Reviewed form and advanced directives Reviewed other doctors No tobacco Occasional wine or vodka Vision is okay--new lenses Hearing tests poorly--he gets along [facility] regularly still ---getting over bronchitis No falls No depression ---but gets lonely at times. No anhedonia Independent with instrumental ADLs No sig memory issues  Feels his handwriting is getting "awful" Hand is shaky--no other known tremor No stiffness of note Still walks 3 miles at a time Using the albuterol now---for wheezing. Discussed it could be from that  Uses the alprazolam mostly for sleep Will rarely use during the day---for rare anxiety (and for the tremor)  Ongoing back pain Oxycodone every morning and then prn Still taking the meloxicam daily Takes the omeprazole daily also--no heartburn or dysphagia on that  No palpitations No chest pain Still some mild SOB with the bronchitis Occasional dizzy spell if bends over and gets up too quick. No syncope  Current Outpatient Medications on File Prior to Visit  Medication Sig Dispense Refill  . albuterol (PROVENTIL HFA) 108 (90 Base) MCG/ACT inhaler Inhale into the lungs.    . ALPRAZolam (XANAX) 1 MG tablet Take 1 tablet (1 mg total) by mouth 2 (two) times daily as needed. 180 tablet 0  . Ascorbic Acid (VITAMIN C) 500 MG tablet Take 500 mg by mouth daily.      Marland Kitchen atenolol (TENORMIN) 25 MG tablet Take 1 tablet (25 mg total) by mouth daily. 90 tablet 3  . atorvastatin (LIPITOR) 10 MG tablet Take 1 tablet (10 mg total) by mouth daily. 90 tablet 3  . azelastine (ASTELIN) 0.1 % nasal spray Place 2 sprays into both nostrils at bedtime. 90 mL 3  . [facility] SENNA PLUS 8.6-50 MG per tablet TAKE 2 TABLETS BY MOUTH TWICE A DAY 180 tablet 7  . fluticasone (FLONASE) 50 MCG/ACT nasal spray Place 2  sprays into both nostrils daily. 48 g 3  . gabapentin (NEURONTIN) 300 MG capsule Take 1 capsule (300 mg total) by mouth 3 (three) times daily. 270 capsule 3  . guaiFENesin-codeine 100-10 MG/5ML syrup Take by mouth.    . hydrochlorothiazide (HYDRODIURIL) 25 MG tablet TAKE 1 TABLET BY MOUTH  DAILY 90 tablet 3  . loratadine (CLARITIN) 10 MG tablet Take 1 tablet (10 mg total) by mouth daily. 90 tablet 3  . meloxicam (MOBIC) 7.5 MG tablet TAKE 1 TABLET BY MOUTH  DAILY 90 tablet 3  . montelukast (SINGULAIR) 10 MG tablet TAKE 1 TABLET BY MOUTH AT  BEDTIME 90 tablet 3  . Multiple Vitamin (MULTIVITAMIN) capsule Take 1 capsule by mouth daily.      Marland Kitchen omeprazole (PRILOSEC) 40 MG capsule TAKE 1 CAPSULE BY MOUTH  DAILY 90 capsule 3  . oxyCODONE-acetaminophen (PERCOCET/ROXICET) 5-325 MG tablet Take 1 tablet by mouth 2 (two) times daily as needed. 60 tablet 0  . Potassium 75 MG TABS Take by mouth daily.      . tamsulosin (FLOMAX) 0.4 MG CAPS capsule TAKE 1 CAPSULE BY MOUTH  DAILY 90 capsule 3  . warfarin (COUMADIN) 5 MG tablet TAKE AS DIRECTED BY  COUMADIN CLINIC 90 tablet 3   No current facility-administered medications on file prior to visit.     Allergies  Allergen Reactions  . Simvastatin     Listlessness, fatigue  . Mucinex D [Pseudoephedrine-Guaifenesin Er]   .  Undecylenic Acid   . Flexeril [Cyclobenzaprine Hcl] Anxiety  . Skelaxin Anxiety    Past Medical History:  Diagnosis Date  . Allergic rhinitis   . Atrial fibrillation ([place])    chronic persistent, failed DCCV x3 in 2000  . BPH (benign prostatic hypertrophy)   . Cervical disc disease   . Diverticulitis   . ED (erectile dysfunction)   . GERD (gastroesophageal reflux disease)   . Hyperlipidemia   . Hypertension   . Impaired fasting glucose   . Lumbar disc disease   . Osteoarthritis   . Sleep disturbance     Past Surgical History:  Procedure Laterality Date  . CATARACT EXTRACTION, BILATERAL  2014  . COLONOSCOPY  2013  .  HEMIARTHROPLASTY SHOULDER FRACTURE  10/2004   left  . JOINT REPLACEMENT  4/12   Left hip--Dr Jefm Bartling  . KNEE ARTHROSCOPY  06/1998   right Jefm Hale)  . LUMBAR LAMINECTOMY  06/10/13   [facility]  . REPLACEMENT UNICONDYLAR JOINT KNEE  8/12   Dr Jefm Walder  . SHOULDER SURGERY  1950's   dislocation  . SHOULDER SURGERY  12/2004   right  . THR--left  4/12   Dr Jefm Kucinski    Family History  Problem Relation Age of Onset  . Stomach cancer Mother   . Stroke Father   . Colon polyps Brother   . Colon cancer Neg Hx     Social History   Socioeconomic History  . Marital status: Divorced    Spouse name: Not on file  . Number of children: 2  . Years of education: Not on file  . Highest education level: Not on file  Social Needs  . Financial resource strain: Not on file  . Food insecurity - worry: Not on file  . Food insecurity - inability: Not on file  . Transportation needs - medical: Not on file  . Transportation needs - non-medical: Not on file  Occupational History  . Occupation: retired, [employer] works part time [employer]: RETIRED  Tobacco Use  . Smoking status: Former Smoker    Types: Cigarettes    Last attempt to quit: 11/21/1962    Years since quitting: 55.1  . Smokeless tobacco: Never Used  Substance and Sexual Activity  . Alcohol use: Yes    Alcohol/week: 0.0 oz    Comment: occasional  . Drug use: No  . Sexual activity: Not on file  Other Topics Concern  . Not on file  Social History Narrative   Plans to do living will   Will designate daughter [name] as health care POA.    Would accept resuscitation but no prolonged artificial ventilation   Would not want tube feeds if cognitively unaware   Review of Systems Codeine cough syrup constipated him---he stopped it. No blood in stool Stays on the senna Appetite is good Weight is fairly stable Sleeps okay with alprazolam Wears seat belt Teeth okay---full dentures Some itching. Wants low back  lesion rechecked. No dermatologist No regular headaches    Objective:   Physical Exam  Constitutional: He is oriented to person, place, and time. He appears well-developed. No distress.  HENT:  Mouth/Throat: Oropharynx is clear and moist. No oropharyngeal exudate.  Neck: No thyromegaly present.  Cardiovascular: Normal rate, normal heart sounds and intact distal pulses. Exam reveals no gallop.  No murmur heard. irregular  Musculoskeletal: He exhibits no edema or tenderness.  Lymphadenopathy:    He has no cervical adenopathy.  Neurological: He is alert and  oriented to person, place, and time.  President--- "Dwaine Deter, Bush" [phone] [id number] Recall 3/3  Normal gait No bradykinesia No sig resting tremor  Skin: No rash noted. No erythema.  ?scratched off keratosis along left flank  Psychiatric: He has a normal mood and affect. His behavior is normal.          Assessment & Plan:

## 2018-01-16 NOTE — Progress Notes (Signed)
Hearing Screening (Inadequate exam)   Method: Audiometry   125Hz  250Hz  500Hz  1000Hz  2000Hz  3000Hz  4000Hz  6000Hz  8000Hz   Right ear:   40 0 40  0    Left ear:   40 0 0  0    Vision Screening Comments: Last eye exam December 2018

## 2018-01-16 NOTE — Assessment & Plan Note (Signed)
Rate control good On coumadin still

## 2018-01-16 NOTE — Assessment & Plan Note (Signed)
No depression Mostly sleep issues and occasional mild anxiety Uses the clonazepam mostly at night

## 2018-01-20 LAB — PAIN MGMT, PROFILE 8 W/CONF, U
6 Acetylmorphine: NEGATIVE ng/mL (ref <10)
Alcohol Metabolites: NEGATIVE ng/mL (ref <500)
Alphahydroxyalprazolam: 284 ng/mL — ABNORMAL HIGH (ref <25)
Alphahydroxymidazolam: NEGATIVE ng/mL (ref <50)
Alphahydroxytriazolam: NEGATIVE ng/mL (ref <50)
Aminoclonazepam: NEGATIVE ng/mL (ref <25)
Amphetamines: NEGATIVE ng/mL (ref <500)
Benzodiazepines: POSITIVE ng/mL — AB (ref <100)
Buprenorphine, Urine: NEGATIVE ng/mL (ref <5)
Cocaine Metabolite: NEGATIVE ng/mL (ref <150)
Codeine: NEGATIVE ng/mL (ref <50)
Creatinine: 193.5 mg/dL
Hydrocodone: NEGATIVE ng/mL (ref <50)
Hydromorphone: NEGATIVE ng/mL (ref <50)
Hydroxyethylflurazepam: NEGATIVE ng/mL (ref <50)
Lorazepam: NEGATIVE ng/mL (ref <50)
MDMA: NEGATIVE ng/mL (ref <500)
Marijuana Metabolite: NEGATIVE ng/mL (ref <20)
Morphine: NEGATIVE ng/mL (ref <50)
Nordiazepam: NEGATIVE ng/mL (ref <50)
Norhydrocodone: NEGATIVE ng/mL (ref <50)
Noroxycodone: 1553 ng/mL — ABNORMAL HIGH (ref <50)
Opiates: NEGATIVE ng/mL (ref <100)
Oxazepam: NEGATIVE ng/mL (ref <50)
Oxidant: NEGATIVE ug/mL (ref <200)
Oxycodone: 905 ng/mL — ABNORMAL HIGH (ref <50)
Oxycodone: POSITIVE ng/mL — AB (ref <100)
Oxymorphone: 2772 ng/mL — ABNORMAL HIGH (ref <50)
Temazepam: NEGATIVE ng/mL (ref <50)
pH: 6.64 (ref 4.5–9.0)

## 2018-01-22 ENCOUNTER — Ambulatory Visit: Payer: Self-pay | Admitting: *Deleted

## 2018-01-22 NOTE — Telephone Encounter (Signed)
Pt seen at walk in clinic and treated for bronchitis approximately 4 weeks ago.  Pt also seen by Dr. Silvio Pate on 2/26. Dr. Silvio Pate will not be in the office for the remainder of the week. Pt states he is still experiencing some SOB with walking and has been coughing but pt states it is not bothering him a whole lot.Pt states he also has wheezing at times. Rena,flow coordinator contacted to see if pt could be worked in for an appt on tomorrow.Per Mearl Latin, pt has appt scheduled at 1:45pm to be seen by Jacquelynn Cree. Pt notified of appt time and advised to go to the ED if symptoms worsened before seen at appt. Pt verbalized understanding.   Reason for Disposition . [1] MILD difficulty breathing (e.g., minimal/no SOB at rest, SOB with walking, pulse <100) AND [2] NEW-onset or WORSE than normal  Answer Assessment - Initial Assessment Questions 1. RESPIRATORY STATUS: "Describe your breathing?" (e.g., wheezing, shortness of breath, unable to speak, severe coughing)      SOB at times 2. ONSET: "When did this breathing problem begin?"      Got worse yesterday and today 3. PATTERN "Does the difficult breathing come and go, or has it been constant since it started?"      Comes and goes 4. SEVERITY: "How bad is your breathing?" (e.g., mild, moderate, severe)    - MILD: No SOB at rest, mild SOB with walking, speaks normally in sentences, can lay down, no retractions, pulse < 100.    - MODERATE: SOB at rest, SOB with minimal exertion and prefers to sit, cannot lie down flat, speaks in phrases, mild retractions, audible wheezing, pulse 100-120.    - SEVERE: Very SOB at rest, speaks in single words, struggling to breathe, sitting hunched forward, retractions, pulse > 120      Moderate 5. RECURRENT SYMPTOM: "Have you had difficulty breathing before?" If so, ask: "When was the last time?" and "What happened that time?"     Yes, before being treated at walk in clinic 6. CARDIAC HISTORY: "Do you have any history of heart  disease?" (e.g., heart attack, angina, bypass surgery, angioplasty)      A-fib 7. LUNG HISTORY: "Do you have any history of lung disease?"  (e.g., pulmonary embolus, asthma, emphysema)     No 8. CAUSE: "What do you think is causing the breathing problem?"      History of bronchitis 9. OTHER SYMPTOMS: "Do you have any other symptoms? (e.g., dizziness, runny nose, cough, chest pain, fever)      Cough- productive at times 11. TRAVEL: "Have you traveled out of the country in the last month?" (e.g., travel history, exposures)       No  Protocols used: BREATHING DIFFICULTY-A-AH

## 2018-01-23 ENCOUNTER — Encounter: Payer: Self-pay | Admitting: Internal Medicine

## 2018-01-23 ENCOUNTER — Ambulatory Visit: Payer: Medicare Other | Admitting: Internal Medicine

## 2018-01-23 VITALS — BP 130/74 | HR 64 | Temp 97.9°F | Wt 187.0 lb

## 2018-01-23 DIAGNOSIS — R062 Wheezing: Secondary | ICD-10-CM

## 2018-01-23 MED ORDER — PREDNISONE 10 MG PO TABS: ORAL_TABLET | ORAL | 0 refills | Status: DC

## 2018-01-23 NOTE — Patient Instructions (Signed)
Bronchospasm, Adult Bronchospasm is a tightening of the airways going into the lungs. During an episode, it may be harder to breathe. You may cough, and you may make a whistling sound when you breathe (wheeze). This condition often affects people with asthma. What are the causes? This condition is caused by swelling and irritation in the airways. It can be triggered by:  An infection (common).  Seasonal allergies.  An allergic reaction.  Exercise.  Irritants. These include pollution, cigarette smoke, strong odors, aerosol sprays, and paint fumes.  Weather changes. Winds increase molds and pollens in the air. Cold air may cause swelling.  Stress and emotional upset.  What are the signs or symptoms? Symptoms of this condition include:  Wheezing. If the episode was triggered by an allergy, wheezing may start right away or hours later.  Nighttime coughing.  Frequent or severe coughing with a simple cold.  Chest tightness.  Shortness of breath.  Decreased ability to exercise.  How is this diagnosed? This condition is usually diagnosed with a review of your medical history and a physical exam. Tests, such as lung function tests, are sometimes done to look for other conditions. The need for a chest X-ray depends on where the wheezing occurs and whether it is the first time you have wheezed. How is this treated? This condition may be treated with:  Inhaled medicines. These open up the airways and help you breathe. They can be taken with an inhaler or a nebulizer device.  Corticosteroid medicines. These may be given for severe bronchospasm, usually when it is associated with asthma.  Avoiding triggers, such as irritants, infection, or allergies.  Follow these instructions at home: Medicines  Take over-the-counter and prescription medicines only as told by your health care provider.  If you need to use an inhaler or nebulizer to take your medicine, ask your health care  provider to explain how to use it correctly. If you were given a spacer, always use it with your inhaler. Lifestyle  Reduce the number of triggers in your home. To do this: ? Change your heating and air conditioning filter at least once a month. ? Limit your use of fireplaces and wood stoves. ? Do not smoke. Do not allow smoking in your home. ? Avoid using perfumes and fragrances. ? Get rid of pests, such as roaches and mice, and their droppings. ? Remove any mold from your home. ? Keep your house clean and dust free. Use unscented cleaning products. ? Replace carpet with wood, tile, or vinyl flooring. Carpet can trap dander and dust. ? Use allergy-proof pillows, mattress covers, and box spring covers. ? Wash bed sheets and blankets every week in hot water. Dry them in a dryer. ? Use blankets that are made of polyester or cotton. ? Wash your hands often. ? Do not allow pets in your bedroom.  Avoid breathing in cold air when you exercise. General instructions  Have a plan for seeking medical care. Know when to call your health care provider and local emergency services, and where to get emergency care.  Stay up to date on your immunizations.  When you have an episode of bronchospasm, stay calm. Try to relax and breathe more slowly.  If you have asthma, make sure you have an asthma action plan.  Keep all follow-up visits as told by your health care provider. This is important. Contact a health care provider if:  You have muscle aches.  You have chest pain.  The mucus that you   cough up (sputum) changes from clear or white to yellow, green, gray, or bloody.  You have a fever.  Your sputum gets thicker. Get help right away if:  Your wheezing and coughing get worse, even after you take your prescribed medicines.  It gets even harder to breathe.  You develop severe chest pain. Summary  Bronchospasm is a tightening of the airways going into the lungs.  During an episode of  bronchospasm, you may have a harder time breathing. You may cough and make a whistling sound when you breathe (wheeze).  Avoid exposure to triggers such as smoke, dust, mold, animal dander, and fragrances.  When you have an episode of bronchospasm, stay calm. Try to relax and breathe more slowly. This information is not intended to replace advice given to you by your health care provider. Make sure you discuss any questions you have with your health care provider. Document Released: 11/10/2003 Document Revised: 11/03/2016 Document Reviewed: 11/03/2016 Elsevier Interactive Patient Education  2017 Elsevier Inc.  

## 2018-01-23 NOTE — Progress Notes (Signed)
HPI  Pt presents to the clinic today with c/o cough and shortness of breath. He reports this has been going on for about a month. The cough is productive of clear mucous. He is mainly short of breath with exertion, not at rest. He has felt like he has been wheezing. He denies chest pain, chest tightness. He was seen 1 month ago for the same, diagnosed with bronchitis and treated with a 7 day course of Doxycycline. He has a history of GERD, HLD, HTN and Afib. He is taking Omeprazole as prescribed. ECG from 12/25/2017 reviewed.  Review of Systems      Past Medical History:  Diagnosis Date  . Allergic rhinitis   . Atrial fibrillation ([place])    chronic persistent, failed DCCV x3 in 2000  . BPH (benign prostatic hypertrophy)   . Cervical disc disease   . Diverticulitis   . ED (erectile dysfunction)   . GERD (gastroesophageal reflux disease)   . Hyperlipidemia   . Hypertension   . Impaired fasting glucose   . Lumbar disc disease   . Osteoarthritis   . Sleep disturbance     Family History  Problem Relation Age of Onset  . Stomach cancer Mother   . Stroke Father   . Colon polyps Brother   . Colon cancer Neg Hx     Social History   Socioeconomic History  . Marital status: Divorced    Spouse name: Not on file  . Number of children: 2  . Years of education: Not on file  . Highest education level: Not on file  Social Needs  . Financial resource strain: Not on file  . Food insecurity - worry: Not on file  . Food insecurity - inability: Not on file  . Transportation needs - medical: Not on file  . Transportation needs - non-medical: Not on file  Occupational History  . Occupation: retired, [employer] works part time [employer]: RETIRED  Tobacco Use  . Smoking status: Former Smoker    Types: Cigarettes    Last attempt to quit: 11/21/1962    Years since quitting: 55.2  . Smokeless tobacco: Never Used  Substance and Sexual Activity  . Alcohol use: Yes   Alcohol/week: 0.0 oz    Comment: occasional  . Drug use: No  . Sexual activity: Not on file  Other Topics Concern  . Not on file  Social History Narrative   Plans to do living will   Will designate daughter [name] as health care POA.    Would accept resuscitation but no prolonged artificial ventilation   Would not want tube feeds if cognitively unaware    Allergies  Allergen Reactions  . Simvastatin     Listlessness, fatigue  . Mucinex D [Pseudoephedrine-Guaifenesin Er]   . Undecylenic Acid   . Flexeril [Cyclobenzaprine Hcl] Anxiety  . Skelaxin Anxiety     Constitutional:  Denies headache, fatigue, fever or abrupt weight changes.  HEENT: Denies eye redness, eye pain, pressure behind the eyes, facial pain, nasal congestion, ear pain, ringing in the ears, wax buildup, runny nose or sore throat. Respiratory: Positive cough and shortness of breath. Denies difficulty breathing.  Cardiovascular: Denies chest pain, chest tightness, palpitations or swelling in the hands or feet.   No other specific complaints in a complete review of systems (except as listed in HPI above).  Objective:   BP 130/74   Pulse 64   Temp 97.9 F (36.6 C) (Oral)   Wt 187  lb (84.8 kg)   SpO2 98%   BMI 30.18 kg/m   Wt Readings from Last 3 Encounters:  01/23/18 187 lb (84.8 kg)  01/16/18 182 lb 8 oz (82.8 kg)  12/25/17 191 lb 4 oz (86.8 kg)     General: Appears his stated age,in NAD. HEENT: Head: normal shape and size; Throat/Mouth: Teeth present, mucosa pink and moist, no exudate noted, no lesions or ulcerations noted.  Neck: No cervical lymphadenopathy.  Cardiovascular: Normal rate with irregular rhythm.   Pulmonary/Chest: Normal effort with intermittent expiratory wheezing noted. No respiratory distress. No rales or ronchi noted.       Assessment & Plan:   Wheezing:  Get some rest and drink plenty of water No indication for additional abx at this time eRx for Pred Taper x 6 days If  worse, consider chest xray  RTC as needed or if symptoms persist.   Webb Silversmith, NP

## 2018-02-09 ENCOUNTER — Telehealth: Payer: Self-pay | Admitting: Internal Medicine

## 2018-02-09 NOTE — Telephone Encounter (Signed)
Copied from [place] [id number]. Topic: Quick Communication - See Telephone Encounter >> Feb 09, 2018 10:01 AM Hewitt Shorts wrote: CRM for notification. See Telephone encounter for: 02/09/18.pt states that he saw Webb Silversmith on 01/23/18 for SOB and he is still having symptoms and would like to know what she would like to be done next-the next available for Rollene Fare is on Monday and patient did not want to wait that long   Best number [phone]

## 2018-02-09 NOTE — Telephone Encounter (Signed)
Pt states he has SOB that he has been dealing with since Feb. Pt has had multiple visits about the same issue with the most recent being with Jacquelynn Cree on 3/5. Pt states symptoms have not worsened but they are not getting better. Pt states he is not struggling to breath and hears a whistling sound with breathing out. Pt advised that there are no appts available for today and if his symptoms continued or worsened he would need to seek treatment in the ED. Pt verbalized understanding. Appt scheduled with pt's PCP on Monday 3/25. Explained to the pt again if symptoms worsened before appt on Monday to seek treatment at Urgent Care.

## 2018-02-12 ENCOUNTER — Encounter: Payer: Self-pay | Admitting: Internal Medicine

## 2018-02-12 ENCOUNTER — Ambulatory Visit: Payer: Medicare Other | Admitting: Internal Medicine

## 2018-02-12 ENCOUNTER — Ambulatory Visit (INDEPENDENT_AMBULATORY_CARE_PROVIDER_SITE_OTHER)
Admission: RE | Admit: 2018-02-12 | Discharge: 2018-02-12 | Disposition: A | Discharge status: Home / Self Care | Payer: Medicare Other | Source: Ambulatory Visit | Attending: Internal Medicine | Admitting: Internal Medicine

## 2018-02-12 VITALS — BP 122/84 | HR 65 | Temp 97.9°F | Resp 12 | Ht 66.0 in | Wt 182.0 lb

## 2018-02-12 DIAGNOSIS — R062 Wheezing: Secondary | ICD-10-CM

## 2018-02-12 DIAGNOSIS — R05 Cough: Secondary | ICD-10-CM | POA: Diagnosis not present

## 2018-02-12 MED ORDER — PREDNISONE 20 MG PO TABS: 40.0000 mg | ORAL_TABLET | Freq: Every day | ORAL | 0 refills | Status: DC

## 2018-02-12 NOTE — Patient Instructions (Signed)
Please take the prednisone again---2 tabs daily for 4 days, then 1 tab daily for 4 days. Try the albuterol inhaler as needed---at least at bedtime (use a spacer so the inhaler is not right in your mouth when you push it down). If you have ongoing symptoms, we can consider a referral to a pulmonary specialist.

## 2018-02-12 NOTE — Progress Notes (Signed)
Subjective:    Patient ID: Mark Padilla, male    DOB: [birth year], 83 y.o.   MRN: [id number]  HPI Here due to breathing problems Mostly bothering him at night since "little cold" in February Seen 3 weeks ago--- prednisone taper didn't help really (or it just came back)  He hears wheezing--when he tries to blow air out Worst at night--especially through nose when trying to breathe through nose at night Sleeps fairly flat Not really SOB No fever  Still some cough--especially after tries   Taking robitussin in AM--when stopped up  Current Outpatient Medications on File Prior to Visit  Medication Sig Dispense Refill  . ALPRAZolam (XANAX) 1 MG tablet Take 1 tablet (1 mg total) by mouth 2 (two) times daily as needed. 180 tablet 0  . Ascorbic Acid (VITAMIN C) 500 MG tablet Take 500 mg by mouth daily.      Marland Kitchen atenolol (TENORMIN) 25 MG tablet Take 1 tablet (25 mg total) by mouth daily. 90 tablet 3  . atorvastatin (LIPITOR) 10 MG tablet Take 1 tablet (10 mg total) by mouth daily. 90 tablet 3  . azelastine (ASTELIN) 0.1 % nasal spray Place 2 sprays into both nostrils at bedtime. 90 mL 3  . [facility] SENNA PLUS 8.6-50 MG per tablet TAKE 2 TABLETS BY MOUTH TWICE A DAY 180 tablet 7  . fluticasone (FLONASE) 50 MCG/ACT nasal spray Place 2 sprays into both nostrils daily. 48 g 3  . gabapentin (NEURONTIN) 300 MG capsule Take 1 capsule (300 mg total) by mouth 3 (three) times daily. 270 capsule 3  . hydrochlorothiazide (HYDRODIURIL) 25 MG tablet Take 1 tablet (25 mg total) by mouth daily. 90 tablet 3  . loratadine (CLARITIN) 10 MG tablet Take 1 tablet (10 mg total) by mouth daily. 90 tablet 3  . meloxicam (MOBIC) 7.5 MG tablet Take 1 tablet (7.5 mg total) by mouth daily. 90 tablet 3  . montelukast (SINGULAIR) 10 MG tablet Take 1 tablet (10 mg total) by mouth at bedtime. 90 tablet 3  . Multiple Vitamin (MULTIVITAMIN) capsule Take 1 capsule by mouth daily.      Marland Kitchen omeprazole (PRILOSEC) 40 MG capsule Take 1  capsule (40 mg total) by mouth daily. 90 capsule 3  . oxyCODONE-acetaminophen (PERCOCET/ROXICET) 5-325 MG tablet Take 1 tablet by mouth 2 (two) times daily as needed. 60 tablet 0  . Potassium 75 MG TABS Take by mouth daily.      . tamsulosin (FLOMAX) 0.4 MG CAPS capsule Take 1 capsule (0.4 mg total) by mouth daily. 90 capsule 3  . warfarin (COUMADIN) 5 MG tablet TAKE AS DIRECTED BY  COUMADIN CLINIC 90 tablet 3  . albuterol (PROVENTIL HFA) 108 (90 Base) MCG/ACT inhaler Inhale into the lungs.     No current facility-administered medications on file prior to visit.     Allergies  Allergen Reactions  . Simvastatin     Listlessness, fatigue  . Mucinex D [Pseudoephedrine-Guaifenesin Er]   . Undecylenic Acid   . Flexeril [Cyclobenzaprine Hcl] Anxiety  . Skelaxin Anxiety    Past Medical History:  Diagnosis Date  . Allergic rhinitis   . Atrial fibrillation ([place])    chronic persistent, failed DCCV x3 in 2000  . BPH (benign prostatic hypertrophy)   . Cervical disc disease   . Diverticulitis   . ED (erectile dysfunction)   . GERD (gastroesophageal reflux disease)   . Hyperlipidemia   . Hypertension   . Impaired fasting glucose   . Lumbar disc  disease   . Osteoarthritis   . Sleep disturbance     Past Surgical History:  Procedure Laterality Date  . CATARACT EXTRACTION, BILATERAL  2014  . COLONOSCOPY  2013  . HEMIARTHROPLASTY SHOULDER FRACTURE  10/2004   left  . JOINT REPLACEMENT  4/12   Left hip--Dr Jefm Hochman  . KNEE ARTHROSCOPY  06/1998   right Jefm Macqueen)  . LUMBAR LAMINECTOMY  06/10/13   [facility]  . REPLACEMENT UNICONDYLAR JOINT KNEE  8/12   Dr Jefm Provence  . SHOULDER SURGERY  1950's   dislocation  . SHOULDER SURGERY  12/2004   right  . THR--left  4/12   Dr Jefm Laban    Family History  Problem Relation Age of Onset  . Stomach cancer Mother   . Stroke Father   . Colon polyps Brother   . Colon cancer Neg Hx     Social History   Socioeconomic History  . Marital status:  Divorced    Spouse name: Not on file  . Number of children: 2  . Years of education: Not on file  . Highest education level: Not on file  Occupational History  . Occupation: retired, [employer] works part time [employer]: RETIRED  Social Needs  . Financial resource strain: Not on file  . Food insecurity:    Worry: Not on file    Inability: Not on file  . Transportation needs:    Medical: Not on file    Non-medical: Not on file  Tobacco Use  . Smoking status: Former Smoker    Types: Cigarettes    Last attempt to quit: 11/21/1962    Years since quitting: 55.2  . Smokeless tobacco: Never Used  Substance and Sexual Activity  . Alcohol use: Yes    Alcohol/week: 0.0 oz    Comment: occasional  . Drug use: No  . Sexual activity: Not on file  Lifestyle  . Physical activity:    Days per week: Not on file    Minutes per session: Not on file  . Stress: Not on file  Relationships  . Social connections:    Talks on phone: Not on file    Gets together: Not on file    Attends religious service: Not on file    Active member of club or organization: Not on file    Attends meetings of clubs or organizations: Not on file    Relationship status: Not on file  . Intimate partner violence:    Fear of current or ex partner: Not on file    Emotionally abused: Not on file    Physically abused: Not on file    Forced sexual activity: Not on file  Other Topics Concern  . Not on file  Social History Narrative   Plans to do living will   Will designate daughter [name] as health care POA.    Would accept resuscitation but no prolonged artificial ventilation   Would not want tube feeds if cognitively unaware   Review of Systems   no major heartburn issues--except 2 days ago after having big slice of onion Appetite is okay     Objective:   Physical Exam  Constitutional: No distress.  HENT:  Mouth/Throat: Oropharynx is clear and moist. No oropharyngeal exudate.    Pulmonary/Chest: Effort normal. No respiratory distress. He has no rales.  Not tight but mild end expiratory wheezes scattered          Assessment & Plan:

## 2018-02-12 NOTE — Assessment & Plan Note (Addendum)
Seems like post infectious bronchial spasm Due to persistence, will check CXR CXR---?some prominent markings but no lobar pneumonia Will consider z-pak if radiologist calls anything Otherwise prednisone again and restart albuterol at bedtime

## 2018-02-22 ENCOUNTER — Telehealth: Payer: Self-pay

## 2018-02-22 ENCOUNTER — Other Ambulatory Visit: Payer: Self-pay | Admitting: General Practice

## 2018-02-22 ENCOUNTER — Ambulatory Visit: Payer: Medicare Other | Admitting: General Practice

## 2018-02-22 DIAGNOSIS — Z5181 Encounter for therapeutic drug level monitoring: Secondary | ICD-10-CM | POA: Diagnosis not present

## 2018-02-22 DIAGNOSIS — I4891 Unspecified atrial fibrillation: Secondary | ICD-10-CM

## 2018-02-22 LAB — POCT INR: INR: 3.6

## 2018-02-22 MED ORDER — OXYCODONE-ACETAMINOPHEN 5-325 MG PO TABS: 1.0000 | ORAL_TABLET | Freq: Two times a day (BID) | ORAL | 0 refills | Status: DC | PRN

## 2018-02-22 NOTE — Telephone Encounter (Signed)
Last filled 12-25-17 #60 Last OV 02-12-18 Next OV 07-17-18 UDS/CSA 01-16-18

## 2018-02-22 NOTE — Patient Instructions (Addendum)
Pre visit review using our clinic review tool, if applicable. No additional management support is needed unless otherwise documented below in the visit note.  Skip coumadin tomorrow and then continue taking 5 mg daily. Recheck in 4 weeks

## 2018-02-22 NOTE — Telephone Encounter (Signed)
Opened in error

## 2018-02-22 NOTE — Telephone Encounter (Signed)
Patient is requesting that his oxycodone be re-filled.

## 2018-03-08 ENCOUNTER — Other Ambulatory Visit: Payer: Self-pay | Admitting: Internal Medicine

## 2018-03-16 DIAGNOSIS — I482 Chronic atrial fibrillation: Secondary | ICD-10-CM | POA: Diagnosis not present

## 2018-03-16 DIAGNOSIS — J019 Acute sinusitis, unspecified: Secondary | ICD-10-CM | POA: Diagnosis not present

## 2018-03-16 DIAGNOSIS — J209 Acute bronchitis, unspecified: Secondary | ICD-10-CM | POA: Diagnosis not present

## 2018-03-16 DIAGNOSIS — R05 Cough: Secondary | ICD-10-CM | POA: Diagnosis not present

## 2018-03-16 DIAGNOSIS — B9689 Other specified bacterial agents as the cause of diseases classified elsewhere: Secondary | ICD-10-CM | POA: Diagnosis not present

## 2018-03-22 ENCOUNTER — Ambulatory Visit: Payer: Medicare Other

## 2018-03-22 DIAGNOSIS — Z5181 Encounter for therapeutic drug level monitoring: Secondary | ICD-10-CM

## 2018-03-22 DIAGNOSIS — I4891 Unspecified atrial fibrillation: Secondary | ICD-10-CM

## 2018-03-22 LAB — POCT INR: INR: 2.1

## 2018-03-22 NOTE — Patient Instructions (Signed)
NR today 2.1  *patient is on abx and prednisone since last week.  INR is therapeutic and he will be finishing tx in next few days.     Patient is to continue taking 5 mg daily. Recheck in 4 weeks.  He verbalizes understanding of all instructions given today.

## 2018-03-23 DIAGNOSIS — J45998 Other asthma: Secondary | ICD-10-CM | POA: Diagnosis not present

## 2018-03-23 DIAGNOSIS — J019 Acute sinusitis, unspecified: Secondary | ICD-10-CM | POA: Diagnosis not present

## 2018-03-23 DIAGNOSIS — B9689 Other specified bacterial agents as the cause of diseases classified elsewhere: Secondary | ICD-10-CM | POA: Diagnosis not present

## 2018-03-23 DIAGNOSIS — J209 Acute bronchitis, unspecified: Secondary | ICD-10-CM | POA: Diagnosis not present

## 2018-04-19 ENCOUNTER — Ambulatory Visit: Payer: Medicare Other | Admitting: General Practice

## 2018-04-19 DIAGNOSIS — I4891 Unspecified atrial fibrillation: Secondary | ICD-10-CM

## 2018-04-19 DIAGNOSIS — Z5181 Encounter for therapeutic drug level monitoring: Secondary | ICD-10-CM | POA: Diagnosis not present

## 2018-04-19 LAB — POCT INR: INR: 2.5 (ref 2.0–3.0)

## 2018-04-19 NOTE — Patient Instructions (Addendum)
Pre visit review using our clinic review tool, if applicable. No additional management support is needed unless otherwise documented below in the visit note.  Patient is to continue taking 5 mg daily. Recheck in 4 weeks.

## 2018-04-25 ENCOUNTER — Telehealth: Payer: Self-pay | Admitting: Internal Medicine

## 2018-04-25 ENCOUNTER — Other Ambulatory Visit: Payer: Self-pay | Admitting: Internal Medicine

## 2018-04-25 NOTE — Telephone Encounter (Unsigned)
Copied from [place] [id number]. Topic: Quick Communication - Rx Refill/Question >> Apr 25, 2018 11:45 AM Carolyn Stare wrote: Medication   oxyCODONE-acetaminophen (PERCOCET/ROXICET) 5-325 MG tablet   Preferred Pharmacy  [place]   Agent: Please be advised that RX refills may take up to 3 business days. We ask that you follow-up with your pharmacy.

## 2018-04-25 NOTE — Telephone Encounter (Signed)
Copied from [place] [id number]. Topic: Quick Communication - Rx Refill/Question >> Apr 25, 2018 11:47 AM Carolyn Stare wrote: Medication  oxyCODONE-acetaminophen (PERCOCET/ROXICET) 5-325 MG tablet    Preferred Pharmacy  [facility]   Agent: Please be advised that RX refills may take up to 3 business days. We ask that you follow-up with your pharmacy.

## 2018-04-26 MED ORDER — OXYCODONE-ACETAMINOPHEN 5-325 MG PO TABS: 1.0000 | ORAL_TABLET | Freq: Two times a day (BID) | ORAL | 0 refills | Status: DC | PRN

## 2018-04-26 NOTE — Telephone Encounter (Signed)
Name of Medication: oxycodone apap Name of [facility]acy[facility] [place] or Written Date and Quantity: # 62 on 02/22/18 Last Office Visit and Type: annual on 01/16/18; acute on 02/12/18 Next Office Visit and Type: 07/17/18 for 6 mth f/u Last Controlled Substance Agreement Date: 01/16/18 Last UDS: 01/16/18

## 2018-04-26 NOTE — Telephone Encounter (Signed)
LOV 02/12/18 Dr. Silvio Pate Last refill  02/22/18  # 60 with 0 refill

## 2018-05-31 ENCOUNTER — Ambulatory Visit: Payer: Medicare Other | Admitting: General Practice

## 2018-05-31 DIAGNOSIS — Z5181 Encounter for therapeutic drug level monitoring: Secondary | ICD-10-CM | POA: Diagnosis not present

## 2018-05-31 DIAGNOSIS — I4891 Unspecified atrial fibrillation: Secondary | ICD-10-CM

## 2018-05-31 LAB — POCT INR: INR: 2.1 (ref 2.0–3.0)

## 2018-05-31 NOTE — Patient Instructions (Addendum)
Pre visit review using our clinic review tool, if applicable. No additional management support is needed unless otherwise documented below in the visit note.  Patient is to continue taking 5 mg daily. Recheck in 6 weeks at patient request.  

## 2018-06-18 ENCOUNTER — Other Ambulatory Visit: Payer: Self-pay | Admitting: Internal Medicine

## 2018-06-18 NOTE — Telephone Encounter (Signed)
Last filled 11-30-17 #180 Last OV Acute 02-12-18 Next OV 07-17-18  [facility]

## 2018-06-25 ENCOUNTER — Other Ambulatory Visit: Payer: Self-pay | Admitting: Internal Medicine

## 2018-06-25 MED ORDER — OXYCODONE-ACETAMINOPHEN 5-325 MG PO TABS: 1.0000 | ORAL_TABLET | Freq: Two times a day (BID) | ORAL | 0 refills | Status: DC | PRN

## 2018-06-25 NOTE — Telephone Encounter (Signed)
Name of Medication: oxycodone apap 5-325 mg Name of Pharmacy: [facility] [place] or Written Date and Quantity: #60 on 04/26/18 Last Office Visit and Type: 02/12/18 acute; 01/16/18 annual and 3 mth narcotic FU Next Office Visit and Type:07/17/18  -  6 mth FU  Last Controlled Substance Agreement Date: 01/16/18 Last UDS:01/16/18

## 2018-06-25 NOTE — Telephone Encounter (Signed)
Percocet refill Last Refill:04/26/18 #60 Last OV: 01/16/18 PCP: Dr. Silvio Pate [facility] [address]   [place]

## 2018-06-25 NOTE — Telephone Encounter (Signed)
Copied from [place] [id number]. Topic: Quick Communication - Rx Refill/Question >> Jun 25, 2018 10:23 AM Synthia Innocent wrote: Medication: oxyCODONE-acetaminophen (PERCOCET/ROXICET) 5-325 MG tablet   Has the patient contacted their pharmacy? Yes.   (Agent: If no, request that the patient contact the pharmacy for the refill.) (Agent: If yes, when and what did the pharmacy advise?)  Preferred Pharmacy (with phone number or street name[facility] [address] [facility] [place]  Agent: Please be advised that RX refills may take up to 3 business days. We ask that you follow-up with your pharmacy.

## 2018-07-12 ENCOUNTER — Ambulatory Visit: Payer: Medicare Other | Admitting: General Practice

## 2018-07-12 DIAGNOSIS — I4891 Unspecified atrial fibrillation: Secondary | ICD-10-CM

## 2018-07-12 DIAGNOSIS — Z5181 Encounter for therapeutic drug level monitoring: Secondary | ICD-10-CM

## 2018-07-12 LAB — POCT INR: INR: 2 (ref 2.0–3.0)

## 2018-07-12 NOTE — Patient Instructions (Addendum)
Pre visit review using our clinic review tool, if applicable. No additional management support is needed unless otherwise documented below in the visit note.  Patient is to continue taking 5 mg daily. Recheck in 6 weeks at patient request.  

## 2018-07-17 ENCOUNTER — Ambulatory Visit: Payer: Medicare Other | Admitting: Internal Medicine

## 2018-07-17 ENCOUNTER — Encounter: Payer: Self-pay | Admitting: Internal Medicine

## 2018-07-17 VITALS — BP 138/70 | HR 47 | Temp 97.8°F | Ht 66.0 in | Wt 183.0 lb

## 2018-07-17 DIAGNOSIS — L570 Actinic keratosis: Secondary | ICD-10-CM | POA: Diagnosis not present

## 2018-07-17 DIAGNOSIS — F1120 Opioid dependence, uncomplicated: Secondary | ICD-10-CM | POA: Diagnosis not present

## 2018-07-17 DIAGNOSIS — F1320 Sedative, hypnotic or anxiolytic dependence, uncomplicated: Secondary | ICD-10-CM | POA: Insufficient documentation

## 2018-07-17 DIAGNOSIS — M5136 Other intervertebral disc degeneration, lumbar region: Secondary | ICD-10-CM | POA: Diagnosis not present

## 2018-07-17 NOTE — Assessment & Plan Note (Signed)
Needs the daily oxycodone to "get going" No issues with this

## 2018-07-17 NOTE — Assessment & Plan Note (Signed)
Verbal consent Liquid nitrogen 30 seconds x 2  Tolerated well Discussed home care

## 2018-07-17 NOTE — Assessment & Plan Note (Signed)
Uses xanax to sleep nightly

## 2018-07-17 NOTE — Progress Notes (Signed)
Subjective:    Patient ID: Mark Padilla, male    DOB: September 07, 1935, 83 y.o.   MRN: [id number]  HPI Here for follow up of pain and chronic narcotic dependence Also uses benzodiazepine regularly as well  Still uses the oxycodone once in the morning "Gets me up and going" [place]--will be gone for a month or more (but this will be after he is already due for refill) Driving in camper  Uses the alprazolam at bedtime Can't go to sleep otherwise--mind just goes  Has some spots on skin to be checked Believes he has precancers on right cheek Also knot on extensor right arm  Current Outpatient Medications on File Prior to Visit  Medication Sig Dispense Refill  . ALPRAZolam (XANAX) 1 MG tablet TAKE 1 TABLET BY MOUTH TWO  TIMES DAILY AS NEEDED 180 tablet 0  . Ascorbic Acid (VITAMIN C) 500 MG tablet Take 500 mg by mouth daily.      Marland Kitchen atenolol (TENORMIN) 25 MG tablet Take 1 tablet (25 mg total) by mouth daily. 90 tablet 3  . atorvastatin (LIPITOR) 10 MG tablet Take 1 tablet (10 mg total) by mouth daily. 90 tablet 3  . azelastine (ASTELIN) 0.1 % nasal spray INSTILL 2 SPRAYS INTO BOTH  NOSTRILS AT BEDTIME. 60 mL 5  . [facility] SENNA PLUS 8.6-50 MG per tablet TAKE 2 TABLETS BY MOUTH TWICE A DAY 180 tablet 7  . fluticasone (FLONASE) 50 MCG/ACT nasal spray Place 2 sprays into both nostrils daily. 48 g 3  . gabapentin (NEURONTIN) 300 MG capsule Take 1 capsule (300 mg total) by mouth 3 (three) times daily. 270 capsule 3  . hydrochlorothiazide (HYDRODIURIL) 25 MG tablet Take 1 tablet (25 mg total) by mouth daily. 90 tablet 3  . loratadine (CLARITIN) 10 MG tablet Take 1 tablet (10 mg total) by mouth daily. 90 tablet 3  . meloxicam (MOBIC) 7.5 MG tablet Take 1 tablet (7.5 mg total) by mouth daily. 90 tablet 3  . montelukast (SINGULAIR) 10 MG tablet Take 1 tablet (10 mg total) by mouth at bedtime. 90 tablet 3  . Multiple Vitamin (MULTIVITAMIN) capsule Take 1 capsule by mouth  daily.      Marland Kitchen omeprazole (PRILOSEC) 40 MG capsule Take 1 capsule (40 mg total) by mouth daily. 90 capsule 3  . oxyCODONE-acetaminophen (PERCOCET/ROXICET) 5-325 MG tablet Take 1 tablet by mouth 2 (two) times daily as needed. 60 tablet 0  . Potassium 75 MG TABS Take by mouth daily.      . tamsulosin (FLOMAX) 0.4 MG CAPS capsule Take 1 capsule (0.4 mg total) by mouth daily. 90 capsule 3  . warfarin (COUMADIN) 5 MG tablet TAKE AS DIRECTED BY  COUMADIN CLINIC 90 tablet 3   No current facility-administered medications on file prior to visit.     Allergies  Allergen Reactions  . Simvastatin     Listlessness, fatigue  . Mucinex D [Pseudoephedrine-Guaifenesin Er]   . Undecylenic Acid   . Flexeril [Cyclobenzaprine Hcl] Anxiety  . Skelaxin Anxiety    Past Medical History:  Diagnosis Date  . Allergic rhinitis   . Atrial fibrillation ([place])    chronic persistent, failed DCCV x3 in 2000  . BPH (benign prostatic hypertrophy)   . Cervical disc disease   . Diverticulitis   . ED (erectile dysfunction)   . GERD (gastroesophageal reflux disease)   . Hyperlipidemia   . Hypertension   . Impaired fasting glucose   .  Lumbar disc disease   . Osteoarthritis   . Sleep disturbance     Past Surgical History:  Procedure Laterality Date  . CATARACT EXTRACTION, BILATERAL  2014  . COLONOSCOPY  2013  . HEMIARTHROPLASTY SHOULDER FRACTURE  10/2004   left  . JOINT REPLACEMENT  4/12   Left hip--Dr Jefm Noseworthy  . KNEE ARTHROSCOPY  06/1998   right Jefm Bruhn)  . LUMBAR LAMINECTOMY  06/10/13   [facility]  . REPLACEMENT UNICONDYLAR JOINT KNEE  8/12   Dr Jefm Mcinerney  . SHOULDER SURGERY  1950's   dislocation  . SHOULDER SURGERY  12/2004   right  . THR--left  4/12   Dr Jefm Zielke    Family History  Problem Relation Age of Onset  . Stomach cancer Mother   . Stroke Father   . Colon polyps Brother   . Colon cancer Neg Hx     Social History   Socioeconomic History  . Marital status: Divorced    Spouse name:  Not on file  . Number of children: 2  . Years of education: Not on file  . Highest education level: Not on file  Occupational History  . Occupation: retired, [employer] works part time [employer]: RETIRED  Social Needs  . Financial resource strain: Not on file  . Food insecurity:    Worry: Not on file    Inability: Not on file  . Transportation needs:    Medical: Not on file    Non-medical: Not on file  Tobacco Use  . Smoking status: Former Smoker    Types: Cigarettes    Last attempt to quit: 11/21/1962    Years since quitting: 55.6  . Smokeless tobacco: Never Used  Substance and Sexual Activity  . Alcohol use: Yes    Alcohol/week: 0.0 standard drinks    Comment: occasional  . Drug use: No  . Sexual activity: Not on file  Lifestyle  . Physical activity:    Days per week: Not on file    Minutes per session: Not on file  . Stress: Not on file  Relationships  . Social connections:    Talks on phone: Not on file    Gets together: Not on file    Attends religious service: Not on file    Active member of club or organization: Not on file    Attends meetings of clubs or organizations: Not on file    Relationship status: Not on file  . Intimate partner violence:    Fear of current or ex partner: Not on file    Emotionally abused: Not on file    Physically abused: Not on file    Forced sexual activity: Not on file  Other Topics Concern  . Not on file  Social History Narrative   Plans to do living will   Will designate daughter [name] as health care POA.    Would accept resuscitation but no prolonged artificial ventilation   Would not want tube feeds if cognitively unaware   Review of Systems  Appetite is fine Weight stable     Objective:   Physical Exam  Constitutional: No distress.  Skin:  Small brown keratoses on right cheek--no clear AK 77mm actinic right extensor arm 20mm other actinic closer to right elbow  Psychiatric: He has a normal mood  and affect. His behavior is normal.           Assessment & Plan:

## 2018-07-17 NOTE — Assessment & Plan Note (Signed)
Reviewed CSRS Narcotic cough syrup Rxs---discussed with him

## 2018-07-25 ENCOUNTER — Other Ambulatory Visit: Payer: Self-pay | Admitting: Internal Medicine

## 2018-07-25 MED ORDER — OXYCODONE-ACETAMINOPHEN 5-325 MG PO TABS: 1.0000 | ORAL_TABLET | Freq: Two times a day (BID) | ORAL | 0 refills | Status: DC | PRN

## 2018-07-25 NOTE — Telephone Encounter (Signed)
Name of Medication: oxycodone apap 5-325 Name of Pharmacy: [facility] [place] or Written Date and Quantity: # 66 on 06/25/18 Last Office Visit and Type: 07/17/18 Narcotic FU Next Office Visit and Type: 01/29/19 AWV Last Controlled Substance Agreement Date:01/16/18  Last UDS:01/16/18

## 2018-07-25 NOTE — Telephone Encounter (Signed)
Copied from [place] [id number]. Topic: Quick Communication - Rx Refill/Question >> Jul 25, 2018  9:38 AM Scherrie Gerlach wrote: Medication: oxyCODONE-acetaminophen (PERCOCET/ROXICET) 5-325 MG tablet  [id number] [address], [place] [phone] (Phone) [phone] (Fax)

## 2018-07-25 NOTE — Telephone Encounter (Signed)
It is just a week or 2 past 6 months.

## 2018-07-25 NOTE — Telephone Encounter (Signed)
Looks like he may be due for follow up before the AMW

## 2018-09-06 ENCOUNTER — Ambulatory Visit: Payer: Medicare Other | Admitting: General Practice

## 2018-09-06 DIAGNOSIS — Z5181 Encounter for therapeutic drug level monitoring: Secondary | ICD-10-CM

## 2018-09-06 DIAGNOSIS — I4891 Unspecified atrial fibrillation: Secondary | ICD-10-CM

## 2018-09-06 LAB — POCT INR: INR: 2.7 (ref 2.0–3.0)

## 2018-09-06 NOTE — Patient Instructions (Addendum)
Pre visit review using our clinic review tool, if applicable. No additional management support is needed unless otherwise documented below in the visit note.  Patient is to continue taking 5 mg daily. Recheck in 6 weeks at patient request.  

## 2018-09-17 ENCOUNTER — Other Ambulatory Visit: Payer: Self-pay | Admitting: Internal Medicine

## 2018-09-17 NOTE — Telephone Encounter (Signed)
Last filled 06-18-18 #180 Last OV 07-17-18 Next OV 01-29-19

## 2018-10-25 ENCOUNTER — Ambulatory Visit: Payer: Medicare Other | Admitting: General Practice

## 2018-10-25 ENCOUNTER — Telehealth: Payer: Self-pay | Admitting: Internal Medicine

## 2018-10-25 ENCOUNTER — Ambulatory Visit (INDEPENDENT_AMBULATORY_CARE_PROVIDER_SITE_OTHER): Payer: Medicare Other

## 2018-10-25 DIAGNOSIS — Z23 Encounter for immunization: Secondary | ICD-10-CM | POA: Diagnosis not present

## 2018-10-25 DIAGNOSIS — I4891 Unspecified atrial fibrillation: Secondary | ICD-10-CM

## 2018-10-25 DIAGNOSIS — Z5181 Encounter for therapeutic drug level monitoring: Secondary | ICD-10-CM

## 2018-10-25 LAB — POCT INR: INR: 2.1 (ref 2.0–3.0)

## 2018-10-25 MED ORDER — OXYCODONE-ACETAMINOPHEN 5-325 MG PO TABS: 1.0000 | ORAL_TABLET | Freq: Two times a day (BID) | ORAL | 0 refills | Status: DC | PRN

## 2018-10-25 NOTE — Telephone Encounter (Signed)
Name of Medication: Oxycodone Name of [facility]acy: [facility] [address] Last [place] or Written Date and Quantity: 07-25-18 #60 Last Office Visit and Type: 07-17-18 6 Month F/U Next Office Visit and Type: 01-29-19 [facility]W Last Controlled Substance Agreement Date: 01-16-18 Last UDS: 01-16-18

## 2018-10-25 NOTE — Patient Instructions (Addendum)
Pre visit review using our clinic review tool, if applicable. No additional management support is needed unless otherwise documented below in the visit note.  Patient is to continue taking 5 mg daily. Recheck in 6 weeks at patient request.  

## 2018-10-25 NOTE — Telephone Encounter (Signed)
Pt is [facility] a [facility]

## 2018-12-03 DIAGNOSIS — H26492 Other secondary cataract, left eye: Secondary | ICD-10-CM | POA: Diagnosis not present

## 2018-12-03 DIAGNOSIS — Z961 Presence of intraocular lens: Secondary | ICD-10-CM | POA: Diagnosis not present

## 2018-12-03 DIAGNOSIS — H5203 Hypermetropia, bilateral: Secondary | ICD-10-CM | POA: Diagnosis not present

## 2018-12-03 DIAGNOSIS — H43392 Other vitreous opacities, left eye: Secondary | ICD-10-CM | POA: Diagnosis not present

## 2018-12-06 ENCOUNTER — Ambulatory Visit (INDEPENDENT_AMBULATORY_CARE_PROVIDER_SITE_OTHER): Payer: Medicare Other | Admitting: General Practice

## 2018-12-06 DIAGNOSIS — Z5181 Encounter for therapeutic drug level monitoring: Secondary | ICD-10-CM | POA: Diagnosis not present

## 2018-12-06 LAB — POCT INR: INR: 2.1 (ref 2.0–3.0)

## 2018-12-06 NOTE — Patient Instructions (Addendum)
Pre visit review using our clinic review tool, if applicable. No additional management support is needed unless otherwise documented below in the visit note.  Patient is to continue taking 5 mg daily. Recheck in 6 weeks at patient request.  

## 2018-12-17 ENCOUNTER — Other Ambulatory Visit: Payer: Self-pay | Admitting: *Deleted

## 2018-12-17 NOTE — Telephone Encounter (Signed)
Name of Medication: Oxycodone [facility] [place] or Written Date and Quantity: 10/25/18 #60 Last Office Visit and Type: 07/17/18 Next Office Visit and Type: 01/29/19 AWV Last Controlled Substance Agreement Date: 01/16/18 Last UDS: 01/16/18

## 2018-12-18 MED ORDER — OXYCODONE-ACETAMINOPHEN 5-325 MG PO TABS: 1.0000 | ORAL_TABLET | Freq: Two times a day (BID) | ORAL | 0 refills | Status: DC | PRN

## 2019-01-14 DIAGNOSIS — H35033 Hypertensive retinopathy, bilateral: Secondary | ICD-10-CM | POA: Diagnosis not present

## 2019-01-14 DIAGNOSIS — H35372 Puckering of macula, left eye: Secondary | ICD-10-CM | POA: Diagnosis not present

## 2019-01-14 DIAGNOSIS — H26492 Other secondary cataract, left eye: Secondary | ICD-10-CM | POA: Diagnosis not present

## 2019-01-14 DIAGNOSIS — H43812 Vitreous degeneration, left eye: Secondary | ICD-10-CM | POA: Diagnosis not present

## 2019-01-17 ENCOUNTER — Ambulatory Visit (INDEPENDENT_AMBULATORY_CARE_PROVIDER_SITE_OTHER): Payer: Medicare Other | Admitting: General Practice

## 2019-01-17 DIAGNOSIS — Z5181 Encounter for therapeutic drug level monitoring: Secondary | ICD-10-CM

## 2019-01-17 LAB — POCT INR: INR: 3 (ref 2.0–3.0)

## 2019-01-17 NOTE — Patient Instructions (Addendum)
Pre visit review using our clinic review tool, if applicable. No additional management support is needed unless otherwise documented below in the visit note.  Patient is to continue taking 5 mg daily. Recheck in 6 weeks at patient request.  

## 2019-01-23 NOTE — Progress Notes (Signed)
Cardiology Office Note  Date:  01/25/2019   ID:  Mark Padilla, DOB 25-May-1935, MRN [id number]  PCP:  Venia Carbon, MD   Chief Complaint  Patient presents with  . other    12 month f/u c/o unbalance/dizzy spell Tuesday morning. Meds reviewed verbally with pt.    HPI:  84 y.o. gentleman with PMH of  chronic atrial fibrillation on warfarin,  HTN,  DC cardioversion that was unsuccessful,  hyperlipidemia,  hip replacement on the left in April 2012,  right knee replacement August 2012 back surgery July 2014 with decompression  previously on Zocor and this was stopped after he had significant weight loss, malaise Changed to Lipitor He presents for routine follow-up of his atrial fibrillation  INTERVAL HISTORY: The patient reports today for follow up. Patient is doing well. On Monday morning 01/25/2019 at 4 am he got up to use the restroom when he felt weakness in his legs and had to lean against the wall.  Uncertain if he was dizzy ,  he did not feel well for the rest of the day after that episode. Still taking atenolol 25 mg.  His older brother had syncope and had a pacemaker placed. He passed away about 7-8 years ago in his 83's due to MI.   Regularly walks a couple of miles everyday, up to 3-3.5 miles.  Denies any significant shortness of breath or chest pain on exertion  Has an appointment with is PCP on 01/29/2019.   His blood pressure at home is usually between 118-123/68-70. His heart rate is usually in the 50's.    Blood pressure 150/71 Total Chol 132/ LDL 71 CR 0.84 Glucose 77   HR BP SYMPTOMS  Lying 68  127/79   Sitting 56  136/77   Standing (57min) 78 139/69   Standing (56min) 79 118/75     EKG personally reviewed by myself on todays visit Shows atrial fibrillation with variable rate rhythm 2.5 s pauses. 47 bpm. Abnormal ECG.    OTHER PAST MEDICAL HISTORY REVIEWED BY ME FOR TODAY'S VISIT:  He reports he is doing better since his low back surgery in July  2014. He is walking 30 minutes per day. Recent upper respiratory infection. Took Mucinex D  and it and had malaise, palpitations. Took in one week to get over this feeling. Now feels back to normal.  no complaints on low-dose Lipitor   He denies chest pain, shortness of breath, edema, or palpitations. no lightheadedness or near syncope.    PMH:   has a past medical history of Allergic rhinitis, Atrial fibrillation (HCC), BPH (benign prostatic hypertrophy), Cervical disc disease, Diverticulitis, ED (erectile dysfunction), GERD (gastroesophageal reflux disease), Hyperlipidemia, Hypertension, Impaired fasting glucose, Lumbar disc disease, Osteoarthritis, and Sleep disturbance.  PSH:    Past Surgical History:  Procedure Laterality Date  . CATARACT EXTRACTION, BILATERAL  2014  . COLONOSCOPY  2013  . HEMIARTHROPLASTY SHOULDER FRACTURE  10/2004   left  . JOINT REPLACEMENT  4/12   Left hip--Dr Jefm Desjardin  . KNEE ARTHROSCOPY  06/1998   right Jefm Collett)  . LUMBAR LAMINECTOMY  06/10/13   [facility]  . REPLACEMENT UNICONDYLAR JOINT KNEE  8/12   Dr Jefm Pham  . SHOULDER SURGERY  1950's   dislocation  . SHOULDER SURGERY  12/2004   right  . THR--left  4/12   Dr Jefm Pirie    Current Outpatient Medications  Medication Sig Dispense Refill  . ALPRAZolam (XANAX) 1 MG tablet TAKE 1 TABLET BY MOUTH TWO  TIMES DAILY AS NEEDED 180 tablet 0  . Ascorbic Acid (VITAMIN C) 500 MG tablet Take 500 mg by mouth daily.      Marland Kitchen atorvastatin (LIPITOR) 10 MG tablet TAKE 1 TABLET BY MOUTH  DAILY 90 tablet 3  . azelastine (ASTELIN) 0.1 % nasal spray INSTILL 2 SPRAYS INTO BOTH  NOSTRILS AT BEDTIME. 60 mL 5  . [facility] SENNA PLUS 8.6-50 MG per tablet TAKE 2 TABLETS BY MOUTH TWICE A DAY 180 tablet 7  . fluticasone (FLONASE) 50 MCG/ACT nasal spray USE 2 SPRAYS IN EACH  NOSTRIL DAILY 48 g 3  . gabapentin (NEURONTIN) 300 MG capsule TAKE 1 CAPSULE BY MOUTH 3  TIMES DAILY 270 capsule 3  . hydrochlorothiazide (HYDRODIURIL) 25 MG tablet Take  1 tablet (25 mg total) by mouth daily. 90 tablet 3  . loratadine (CLARITIN) 10 MG tablet Take 1 tablet (10 mg total) by mouth daily. 90 tablet 3  . meloxicam (MOBIC) 7.5 MG tablet Take 1 tablet (7.5 mg total) by mouth daily. 90 tablet 3  . montelukast (SINGULAIR) 10 MG tablet Take 1 tablet (10 mg total) by mouth at bedtime. 90 tablet 3  . Multiple Vitamin (MULTIVITAMIN) capsule Take 1 capsule by mouth daily.      Marland Kitchen omeprazole (PRILOSEC) 40 MG capsule Take 1 capsule (40 mg total) by mouth daily. 90 capsule 3  . oxyCODONE-acetaminophen (PERCOCET/ROXICET) 5-325 MG tablet Take 1 tablet by mouth 2 (two) times daily as needed. 60 tablet 0  . Potassium 75 MG TABS Take by mouth daily.      . tamsulosin (FLOMAX) 0.4 MG CAPS capsule Take 1 capsule (0.4 mg total) by mouth daily. 90 capsule 3  . warfarin (COUMADIN) 5 MG tablet TAKE AS DIRECTED BY  COUMADIN CLINIC 90 tablet 3   No current facility-administered medications for this visit.      Allergies:   Simvastatin; Mucinex d [pseudoephedrine-guaifenesin er]; Undecylenic acid; Flexeril [cyclobenzaprine hcl]; and Skelaxin   Social History:  The patient  reports that he quit smoking about 56 years ago. His smoking use included cigarettes. He has never used smokeless tobacco. He reports current alcohol use. He reports that he does not use drugs.   Family History:   family history includes Colon polyps in his brother; Stomach cancer in his mother; Stroke in his father.    Review of Systems: Review of Systems  Constitutional: Negative.   Eyes: Negative.   Respiratory: Negative.   Cardiovascular: Negative.   Gastrointestinal: Negative.   Genitourinary: Negative.   Musculoskeletal: Negative.   Neurological: Positive for dizziness and weakness.  Psychiatric/Behavioral: Negative.   All other systems reviewed and are negative.    PHYSICAL EXAM: VS:  BP (!) 150/71 (BP Location: Right Arm, Patient Position: Sitting, Cuff Size: Normal)   Pulse (!) 47    Ht 5\' 7"  (1.702 m)   Wt 185 lb 4 oz (84 kg)   BMI 29.01 kg/m  , BMI Body mass index is 29.01 kg/m.  Constitutional:  oriented to person, place, and time. No distress.  HENT:  Head: Grossly normal Eyes:  no discharge. No scleral icterus.  Neck: No JVD, no carotid bruits  Cardiovascular: Atrial fibrillation with varying rates and 2.5 pauses, no murmurs appreciated  Pulmonary/Chest: Clear to auscultation bilaterally, no wheezes or rales Abdominal: Soft.  no distension.  no tenderness.  Musculoskeletal: Normal range of motion Neurological:  normal muscle tone. Coordination normal. No atrophy Skin: Skin warm and dry Psychiatric: normal affect, pleasant   Recent  Labs: No results found for requested labs within last 8760 hours.    Lipid Panel Lab Results  Component Value Date   CHOL 132 01/16/2018   HDL 46.00 01/16/2018   LDLCALC 71 01/16/2018   TRIG 74.0 01/16/2018      Wt Readings from Last 3 Encounters:  01/25/19 185 lb 4 oz (84 kg)  07/17/18 183 lb (83 kg)  02/12/18 182 lb (82.6 kg)     ASSESSMENT AND PLAN:  Atrial fibrillation, unspecified type ([place])  Plan: EKG 12-Lead Recommend discontinuing atenolol. Come in one week for a Zio monitor placement.  2.5-second pause on EKG today.  Will need to monitor closely Results discussed with him in detail in relation to his EKG and his brother who had syncope requiring pacemaker  Bradycardia As detailed above, with pauses Possibly exacerbated by atenolol.  Atenolol will be held with close monitoring as detailed above  Mixed hyperlipidemia Plan: Exercising on a regular basis, cholesterol well controlled  Chronic back pain Plan: Previous laminectomy at [facility] Dr. Delilah Shan Recommend he continue his exercise program  Essential hypertension Plan: EKG 12-Lead Recommend he hold atenolol today given bradycardia   Total encounter time more than 25 minutes  Greater than 50% was spent in counseling and coordination of care  with the patient   Disposition:   F/U 6 months   Orders Placed This Encounter  Procedures  . LONG TERM MONITOR (3-14 DAYS)  . EKG 12-Lead   I, Jesus Reyes am acting as a scribe for Ida Rogue, M.D., Ph.D.  I, Ida Rogue, M.D. Ph.D., have reviewed the above documentation for accuracy and completeness, and I agree with the above.    Signed, Esmond Plants, M.D., Ph.D. 01/25/2019  [place], [place]

## 2019-01-25 ENCOUNTER — Ambulatory Visit: Payer: Medicare Other | Admitting: Cardiovascular Disease

## 2019-01-25 ENCOUNTER — Encounter: Payer: Self-pay | Admitting: Cardiovascular Disease

## 2019-01-25 VITALS — BP 150/71 | HR 47 | Ht 67.0 in | Wt 185.2 lb

## 2019-01-25 DIAGNOSIS — I4821 Permanent atrial fibrillation: Secondary | ICD-10-CM

## 2019-01-25 NOTE — Patient Instructions (Addendum)
Medication Instructions:  Your physician has recommended you make the following change in your medication:  1. STOP Atenolol  If you need a refill on your cardiac medications before your next appointment, please call your pharmacy.    Lab work: No new labs needed   If you have labs (blood work) drawn today and your tests are completely normal, you will receive your results only by: Marland Kitchen MyChart Message (if you have MyChart) OR . A paper copy in the mail If you have any lab test that is abnormal or we need to change your treatment, we will call you to review the results.   Testing/Procedures: In one week, Please come in for placement of a ZIO monitor  When you come in to have monitor placed you will not be able to shower for the first 24 hours.   Follow-Up: At [facility], you and your health needs are our priority.  As part of our continuing mission to provide you with exceptional heart care, we have created designated Provider Care Teams.  These Care Teams include your primary Cardiologist (physician) and Advanced Practice Providers (APPs -  Physician Assistants and Nurse Practitioners) who all work together to provide you with the care you need, when you need it.  . You will need a follow up appointment in 6 months .   Please call our office 2 months in advance to schedule this appointment.    . Providers on your designated Care Team:   . Murray Hodgkins, NP . Christell Faith, PA-C . Marrianne Mood, PA-C  Any Other Special Instructions Will Be Listed Below (If Applicable).  For educational health videos Log in to : [email or web address] Or : [email or web address], password : [email or web address]

## 2019-01-29 ENCOUNTER — Encounter: Payer: Self-pay | Admitting: Internal Medicine

## 2019-01-29 ENCOUNTER — Ambulatory Visit (INDEPENDENT_AMBULATORY_CARE_PROVIDER_SITE_OTHER): Payer: Medicare Other | Admitting: Internal Medicine

## 2019-01-29 VITALS — BP 128/84 | HR 83 | Temp 98.0°F | Ht 65.25 in | Wt 182.0 lb

## 2019-01-29 DIAGNOSIS — M5136 Other intervertebral disc degeneration, lumbar region: Secondary | ICD-10-CM

## 2019-01-29 DIAGNOSIS — M51369 Other intervertebral disc degeneration, lumbar region without mention of lumbar back pain or lower extremity pain: Secondary | ICD-10-CM

## 2019-01-29 DIAGNOSIS — F39 Unspecified mood [affective] disorder: Secondary | ICD-10-CM

## 2019-01-29 DIAGNOSIS — Z0000 Encounter for general adult medical examination without abnormal findings: Secondary | ICD-10-CM | POA: Diagnosis not present

## 2019-01-29 DIAGNOSIS — I4891 Unspecified atrial fibrillation: Secondary | ICD-10-CM

## 2019-01-29 DIAGNOSIS — Z7189 Other specified counseling: Secondary | ICD-10-CM

## 2019-01-29 DIAGNOSIS — I10 Essential (primary) hypertension: Secondary | ICD-10-CM | POA: Diagnosis not present

## 2019-01-29 DIAGNOSIS — F1120 Opioid dependence, uncomplicated: Secondary | ICD-10-CM

## 2019-01-29 DIAGNOSIS — F1320 Sedative, hypnotic or anxiolytic dependence, uncomplicated: Secondary | ICD-10-CM

## 2019-01-29 LAB — CBC
HCT: 41.9 % (ref 39.0–52.0)
Hemoglobin: 14.6 g/dL (ref 13.0–17.0)
MCHC: 34.7 g/dL (ref 30.0–36.0)
MCV: 90.7 fl (ref 78.0–100.0)
Platelets: 181 10*3/uL (ref 150.0–400.0)
RBC: 4.62 Mil/uL (ref 4.22–5.81)
RDW: 13.2 % (ref 11.5–15.5)
WBC: 5.2 10*3/uL (ref 4.0–10.5)

## 2019-01-29 LAB — COMPREHENSIVE METABOLIC PANEL
ALT: 19 U/L (ref 0–53)
AST: 27 U/L (ref 0–37)
Albumin: 4.4 g/dL (ref 3.5–5.2)
Alkaline Phosphatase: 69 U/L (ref 39–117)
BUN: 18 mg/dL (ref 6–23)
CO2: 29 mEq/L (ref 19–32)
Calcium: 9.3 mg/dL (ref 8.4–10.5)
Chloride: 97 mEq/L (ref 96–112)
Creatinine, Ser: 0.88 mg/dL (ref 0.40–1.50)
GFR: 82.87 mL/min (ref >60.00)
Glucose, Bld: 92 mg/dL (ref 70–99)
Potassium: 4 mEq/L (ref 3.5–5.1)
Sodium: 133 mEq/L — ABNORMAL LOW (ref 135–145)
Total Bilirubin: 0.4 mg/dL (ref 0.2–1.2)
Total Protein: 6.9 g/dL (ref 6.0–8.3)

## 2019-01-29 LAB — LIPID PANEL
Cholesterol: 125 mg/dL (ref 0–200)
HDL: 52 mg/dL (ref >39.00)
LDL Cholesterol: 61 mg/dL (ref 0–99)
NonHDL: 72.52
Total CHOL/HDL Ratio: 2
Triglycerides: 59 mg/dL (ref 0.0–149.0)
VLDL: 11.8 mg/dL (ref 0.0–40.0)

## 2019-01-29 NOTE — Assessment & Plan Note (Signed)
I have personally reviewed the Medicare Annual Wellness questionnaire and have noted 1. The patient's medical and social history 2. Their use of alcohol, tobacco or illicit drugs 3. Their current medications and supplements 4. The patient's functional ability including ADL's, fall risks, home safety risks and hearing or visual             impairment. 5. Diet and physical activities 6. Evidence for depression or mood disorders  The patients weight, height, BMI and visual acuity have been recorded in the chart I have made referrals, counseling and provided education to the patient based review of the above and I have provided the pt with a written personalized care plan for preventive services.  I have provided you with a copy of your personalized plan for preventive services. Please take the time to review along with your updated medication list.  Yearly flu vaccine Discussed continued exercise Mild URI--symptomatic Rx for now Consider shingrix

## 2019-01-29 NOTE — Progress Notes (Signed)
Hearing Screening   Method: Audiometry   125Hz  250Hz  500Hz  1000Hz  2000Hz  3000Hz  4000Hz  6000Hz  8000Hz   Right ear:   40 0 40  0    Left ear:   40 0 0  0    Vision Screening Comments: February 2020

## 2019-01-29 NOTE — Assessment & Plan Note (Signed)
Reviewed CSRS--no problems Will check UDS

## 2019-01-29 NOTE — Assessment & Plan Note (Signed)
Rate is better now off the atenolol On coumadin

## 2019-01-29 NOTE — Assessment & Plan Note (Signed)
Chronic pain--especially in low back Uses the oxycodone every morning and sometimes later in day

## 2019-01-29 NOTE — Progress Notes (Signed)
Subjective:    Patient ID: Mark Padilla, male    DOB: 04/08/1935, 84 y.o.   MRN: [id number]  HPI Here for Medicare wellness visit and follow up of chronic health conditons Reviewed advanced directives Reviewed other doctors--Mark Padilla--cardiology, Mark Padilla--optometry,  Hearing poor in left ear--right is okay No tobacco Occasional vodka/grapefruit Still walks regularly--holding off during cold Has fallen a couple of times--only minor scratches. Sidewalk was irregular No regular depression or anhedonia Independent with instrumental ADLs No memory problems other than names and some mild short term issues  Has had a cold for several days Cough --dry at first. Rhinorrhea, etc Tried mucines and robitussin--some help. Tylenol for headache Improved now No recent travel  Had laser procedure on left eye ---had film after cataract surgery years ago Vision is great now No hospitalizations  Does get some anxiety at times Mostly has sleep problems--the alprazolam does help at night  Recent cardiology appt Atenolol stopped due to bradycardia--and he did feel "down" No chest pain No palpitations No dizziness or syncope No edema  Back pain is about the same Severe at times---hard to even stand up Oxycodone does help Takes every morning--but tries to limit the other dose  Current Outpatient Medications on File Prior to Visit  Medication Sig Dispense Refill  . ALPRAZolam (XANAX) 1 MG tablet TAKE 1 TABLET BY MOUTH TWO  TIMES DAILY AS NEEDED 180 tablet 0  . Ascorbic Acid (VITAMIN C) 500 MG tablet Take 500 mg by mouth daily.      Marland Kitchen atorvastatin (LIPITOR) 10 MG tablet TAKE 1 TABLET BY MOUTH  DAILY 90 tablet 3  . azelastine (ASTELIN) 0.1 % nasal spray INSTILL 2 SPRAYS INTO BOTH  NOSTRILS AT BEDTIME. 60 mL 5  . [facility] SENNA PLUS 8.6-50 MG per tablet TAKE 2 TABLETS BY MOUTH TWICE A DAY 180 tablet 7  . fluticasone (FLONASE) 50 MCG/ACT nasal spray USE 2 SPRAYS IN EACH  NOSTRIL DAILY 48 g 3  .  gabapentin (NEURONTIN) 300 MG capsule TAKE 1 CAPSULE BY MOUTH 3  TIMES DAILY 270 capsule 3  . hydrochlorothiazide (HYDRODIURIL) 25 MG tablet Take 1 tablet (25 mg total) by mouth daily. 90 tablet 3  . loratadine (CLARITIN) 10 MG tablet Take 1 tablet (10 mg total) by mouth daily. 90 tablet 3  . meloxicam (MOBIC) 7.5 MG tablet Take 1 tablet (7.5 mg total) by mouth daily. 90 tablet 3  . montelukast (SINGULAIR) 10 MG tablet Take 1 tablet (10 mg total) by mouth at bedtime. 90 tablet 3  . Multiple Vitamin (MULTIVITAMIN) capsule Take 1 capsule by mouth daily.      Marland Kitchen omeprazole (PRILOSEC) 40 MG capsule Take 1 capsule (40 mg total) by mouth daily. 90 capsule 3  . oxyCODONE-acetaminophen (PERCOCET/ROXICET) 5-325 MG tablet Take 1 tablet by mouth 2 (two) times daily as needed. 60 tablet 0  . Potassium 75 MG TABS Take by mouth daily.      . tamsulosin (FLOMAX) 0.4 MG CAPS capsule Take 1 capsule (0.4 mg total) by mouth daily. 90 capsule 3  . warfarin (COUMADIN) 5 MG tablet TAKE AS DIRECTED BY  COUMADIN CLINIC 90 tablet 3   No current facility-administered medications on file prior to visit.     Allergies  Allergen Reactions  . Simvastatin     Listlessness, fatigue  . Mucinex D [Pseudoephedrine-Guaifenesin Er]   . Undecylenic Acid   . Flexeril [Cyclobenzaprine Hcl] Anxiety  . Skelaxin Anxiety    Past Medical History:  Diagnosis Date  .  Allergic rhinitis   . Atrial fibrillation ([place])    chronic persistent, failed DCCV x3 in 2000  . BPH (benign prostatic hypertrophy)   . Cervical disc disease   . Diverticulitis   . ED (erectile dysfunction)   . GERD (gastroesophageal reflux disease)   . Hyperlipidemia   . Hypertension   . Impaired fasting glucose   . Lumbar disc disease   . Osteoarthritis   . Sleep disturbance     Past Surgical History:  Procedure Laterality Date  . CATARACT EXTRACTION, BILATERAL  2014  . COLONOSCOPY  2013  . HEMIARTHROPLASTY SHOULDER FRACTURE  10/2004   left  . JOINT  REPLACEMENT  4/12   Left hip--Dr Mark Padilla  . KNEE ARTHROSCOPY  06/1998   right Mark Padilla)  . LUMBAR LAMINECTOMY  06/10/13   [facility]  . REPLACEMENT UNICONDYLAR JOINT KNEE  8/12   Dr Mark Padilla  . SHOULDER SURGERY  1950's   dislocation  . SHOULDER SURGERY  12/2004   right  . THR--left  4/12   Dr Mark Padilla    Family History  Problem Relation Age of Onset  . Stomach cancer Mother   . Stroke Father   . Colon polyps Brother   . Colon cancer Neg Hx     Social History   Socioeconomic History  . Marital status: Divorced    Spouse name: Not on file  . Number of children: 2  . Years of education: Not on file  . Highest education level: Not on file  Occupational History  . Occupation: retired, [employer] works part time [employer]: RETIRED  Social Needs  . Financial resource strain: Not on file  . Food insecurity:    Worry: Not on file    Inability: Not on file  . Transportation needs:    Medical: Not on file    Non-medical: Not on file  Tobacco Use  . Smoking status: Former Smoker    Types: Cigarettes    Last attempt to quit: 11/21/1962    Years since quitting: 56.2  . Smokeless tobacco: Never Used  Substance and Sexual Activity  . Alcohol use: Yes    Alcohol/week: 0.0 standard drinks    Comment: occasional  . Drug use: No  . Sexual activity: Not on file  Lifestyle  . Physical activity:    Days per week: Not on file    Minutes per session: Not on file  . Stress: Not on file  Relationships  . Social connections:    Talks on phone: Not on file    Gets together: Not on file    Attends religious service: Not on file    Active member of club or organization: Not on file    Attends meetings of clubs or organizations: Not on file    Relationship status: Not on file  . Intimate partner violence:    Fear of current or ex partner: Not on file    Emotionally abused: Not on file    Physically abused: Not on file    Forced sexual activity: Not on file  Other  Topics Concern  . Not on file  Social History Narrative   Plans to do living will   Will designate daughter Mark Padilla as health care POA.    Would accept resuscitation but no prolonged artificial ventilation   Would not want tube feeds if cognitively unaware   Review of Systems Full dentures--no dental follow up Doesn't see dermatologist--did shave off keratosis on right cheek (healing  up now) Appetite is fine Weight stable Sleeps okay with the xanax Wears seat belt  Bowels are fine--does get slight blood on paper (hemorrhoid) Voids okay--stream slow at the end. Nocturia is rare Rare heartburn on the omeprazole. No dysphagia Does have shoulder and other joint pains (knee/hip, etc)    Objective:   Physical Exam  Constitutional: He is oriented to person, place, and time. He appears well-developed. No distress.  HENT:  Mouth/Throat: Oropharynx is clear and moist. No oropharyngeal exudate.  Neck: No thyromegaly present.  Cardiovascular: Normal rate and normal heart sounds. Exam reveals no gallop.  No murmur heard. Rate in 60's irregular  Respiratory: Effort normal and breath sounds normal. No respiratory distress. He has no wheezes. He has no rales.  GI: Soft. There is no abdominal tenderness.  Musculoskeletal:        General: No tenderness or edema.  Lymphadenopathy:    He has no cervical adenopathy.  Neurological: He is alert and oriented to person, place, and time.  President--- "Trump, Obama, Clinton---Bush" [id number] Recall 2/3  Skin: No rash noted. No erythema.  Benign keratosis on right cheek  Psychiatric: He has a normal mood and affect. His behavior is normal.           Assessment & Plan:

## 2019-01-29 NOTE — Assessment & Plan Note (Signed)
Mild anxiety Mostly sleep disorder--uses the xanax

## 2019-01-29 NOTE — Assessment & Plan Note (Signed)
See social history 

## 2019-01-29 NOTE — Assessment & Plan Note (Signed)
Uses the xanax every night

## 2019-01-29 NOTE — Assessment & Plan Note (Signed)
BP Readings from Last 3 Encounters:  01/29/19 128/84  01/25/19 (!) 150/71  07/17/18 138/70   Good control

## 2019-02-01 ENCOUNTER — Ambulatory Visit (INDEPENDENT_AMBULATORY_CARE_PROVIDER_SITE_OTHER): Payer: Medicare Other

## 2019-02-01 ENCOUNTER — Other Ambulatory Visit: Payer: Self-pay

## 2019-02-01 DIAGNOSIS — I4821 Permanent atrial fibrillation: Secondary | ICD-10-CM | POA: Diagnosis not present

## 2019-02-02 LAB — PAIN MGMT, PROFILE 8 W/CONF, U
6 Acetylmorphine: NEGATIVE ng/mL (ref <10)
Alcohol Metabolites: NEGATIVE ng/mL (ref <500)
Alphahydroxyalprazolam: 222 ng/mL — ABNORMAL HIGH (ref <25)
Alphahydroxymidazolam: NEGATIVE ng/mL (ref <50)
Alphahydroxytriazolam: NEGATIVE ng/mL (ref <50)
Aminoclonazepam: NEGATIVE ng/mL (ref <25)
Amphetamines: NEGATIVE ng/mL (ref <500)
Benzodiazepines: POSITIVE ng/mL — AB (ref <100)
Buprenorphine, Urine: NEGATIVE ng/mL (ref <5)
Cocaine Metabolite: NEGATIVE ng/mL (ref <150)
Codeine: NEGATIVE ng/mL (ref <50)
Creatinine: 185.6 mg/dL
Hydrocodone: NEGATIVE ng/mL (ref <50)
Hydromorphone: NEGATIVE ng/mL (ref <50)
Hydroxyethylflurazepam: NEGATIVE ng/mL (ref <50)
Lorazepam: NEGATIVE ng/mL (ref <50)
MDMA: NEGATIVE ng/mL (ref <500)
Marijuana Metabolite: NEGATIVE ng/mL (ref <20)
Morphine: NEGATIVE ng/mL (ref <50)
Nordiazepam: NEGATIVE ng/mL (ref <50)
Norhydrocodone: NEGATIVE ng/mL (ref <50)
Noroxycodone: 1290 ng/mL — ABNORMAL HIGH (ref <50)
Opiates: NEGATIVE ng/mL (ref <100)
Oxazepam: NEGATIVE ng/mL (ref <50)
Oxidant: NEGATIVE ug/mL (ref <200)
Oxycodone: 848 ng/mL — ABNORMAL HIGH (ref <50)
Oxycodone: POSITIVE ng/mL — AB (ref <100)
Oxymorphone: 1113 ng/mL — ABNORMAL HIGH (ref <50)
Temazepam: NEGATIVE ng/mL (ref <50)
pH: 6.59 (ref 4.5–9.0)

## 2019-02-12 ENCOUNTER — Other Ambulatory Visit: Payer: Self-pay | Admitting: Internal Medicine

## 2019-02-18 ENCOUNTER — Other Ambulatory Visit: Payer: Self-pay | Admitting: *Deleted

## 2019-02-18 MED ORDER — OXYCODONE-ACETAMINOPHEN 5-325 MG PO TABS: 1.0000 | ORAL_TABLET | Freq: Two times a day (BID) | ORAL | 0 refills | Status: DC | PRN

## 2019-02-18 NOTE — Telephone Encounter (Signed)
Patient left a voicemail requesting a refill on his Oxycodone   Name of Medication: Oxycodone Name of [facility] or Written Date and Quantity: 12/18/18 #60 Last Office Visit and Type: 01/29/19 CPE Next Office Visit and Type: None scheduled Last Controlled Substance Agreement Date: 01/29/19 Last UDS:01/29/19

## 2019-02-19 DIAGNOSIS — I4821 Permanent atrial fibrillation: Secondary | ICD-10-CM | POA: Diagnosis not present

## 2019-02-21 ENCOUNTER — Other Ambulatory Visit (INDEPENDENT_AMBULATORY_CARE_PROVIDER_SITE_OTHER): Payer: Medicare Other

## 2019-02-21 ENCOUNTER — Ambulatory Visit (INDEPENDENT_AMBULATORY_CARE_PROVIDER_SITE_OTHER): Payer: Medicare Other | Admitting: General Practice

## 2019-02-21 ENCOUNTER — Other Ambulatory Visit: Payer: Self-pay

## 2019-02-21 DIAGNOSIS — I4891 Unspecified atrial fibrillation: Secondary | ICD-10-CM

## 2019-02-21 DIAGNOSIS — Z5181 Encounter for therapeutic drug level monitoring: Secondary | ICD-10-CM

## 2019-02-21 DIAGNOSIS — R6889 Other general symptoms and signs: Secondary | ICD-10-CM | POA: Diagnosis not present

## 2019-02-21 DIAGNOSIS — J069 Acute upper respiratory infection, unspecified: Secondary | ICD-10-CM | POA: Diagnosis not present

## 2019-02-21 LAB — POCT INR: INR: 2.4 (ref 2.0–3.0)

## 2019-02-21 NOTE — Patient Instructions (Signed)
Pre visit review using our clinic review tool, if applicable. No additional management support is needed unless otherwise documented below in the visit note.  Patient is to continue taking 5 mg daily. Recheck in 6 weeks at patient request.

## 2019-03-20 ENCOUNTER — Other Ambulatory Visit: Payer: Self-pay | Admitting: Internal Medicine

## 2019-03-25 ENCOUNTER — Other Ambulatory Visit: Payer: Self-pay | Admitting: Internal Medicine

## 2019-03-25 MED ORDER — ALPRAZOLAM 1 MG PO TABS: ORAL_TABLET | ORAL | 0 refills | Status: DC

## 2019-03-25 NOTE — Telephone Encounter (Signed)
Alprazolam last filled 09-09-18 #180 Last OV 01-29-19 No Future OV [facility]

## 2019-03-25 NOTE — Telephone Encounter (Signed)
Printed rx and faxed to [facility]

## 2019-03-25 NOTE — Addendum Note (Signed)
Addended by: Pilar Grammes on: 03/25/2019 11:21 AM   Modules accepted: Orders

## 2019-04-04 ENCOUNTER — Other Ambulatory Visit (INDEPENDENT_AMBULATORY_CARE_PROVIDER_SITE_OTHER): Payer: Medicare Other

## 2019-04-04 ENCOUNTER — Ambulatory Visit (INDEPENDENT_AMBULATORY_CARE_PROVIDER_SITE_OTHER): Payer: Medicare Other | Admitting: General Practice

## 2019-04-04 DIAGNOSIS — Z5181 Encounter for therapeutic drug level monitoring: Secondary | ICD-10-CM | POA: Diagnosis not present

## 2019-04-04 DIAGNOSIS — I4891 Unspecified atrial fibrillation: Secondary | ICD-10-CM | POA: Diagnosis not present

## 2019-04-04 LAB — POCT INR: INR: 2.2 (ref 2.0–3.0)

## 2019-04-04 NOTE — Patient Instructions (Signed)
Pre visit review using our clinic review tool, if applicable. No additional management support is needed unless otherwise documented below in the visit note.  Patient is to continue taking 5 mg daily. Recheck in 6 weeks at patient request.

## 2019-04-19 ENCOUNTER — Telehealth: Payer: Self-pay | Admitting: *Deleted

## 2019-04-19 MED ORDER — OXYCODONE-ACETAMINOPHEN 5-325 MG PO TABS: 1.0000 | ORAL_TABLET | Freq: Two times a day (BID) | ORAL | 0 refills | Status: DC | PRN

## 2019-04-19 NOTE — Telephone Encounter (Signed)
I left a message on patient's voice mail to return my call.

## 2019-04-19 NOTE — Telephone Encounter (Signed)
Please schedule virtual controlled substance follow up in about a month

## 2019-04-19 NOTE — Telephone Encounter (Addendum)
Patient left a voicemail requesting a refill  Name of Medication: Oxycodone [facility] or Written Date and Quantity: 02/18/19 #60 Last Office Visit and Type: 01/29/19 CPE Next Office Visit and Type: none scheduled Last Controlled Substance Agreement Date: 01/29/19 Last UDS:01/29/19

## 2019-04-23 NOTE — Telephone Encounter (Signed)
Spoke to pt. Suggested he contact his insurance and find out what the issue is with the rx.

## 2019-04-23 NOTE — Telephone Encounter (Signed)
I spoke to patient.  Patient said when he went to pick up the refill for his medication it was only a 7 day supply.  Patient doesn't have video capability, so I scheduled patient for a phone call on 05/17/19.

## 2019-05-15 ENCOUNTER — Other Ambulatory Visit (INDEPENDENT_AMBULATORY_CARE_PROVIDER_SITE_OTHER): Payer: Medicare Other

## 2019-05-15 ENCOUNTER — Ambulatory Visit (INDEPENDENT_AMBULATORY_CARE_PROVIDER_SITE_OTHER): Payer: Medicare Other | Admitting: General Practice

## 2019-05-15 DIAGNOSIS — I4891 Unspecified atrial fibrillation: Secondary | ICD-10-CM

## 2019-05-15 DIAGNOSIS — Z5181 Encounter for therapeutic drug level monitoring: Secondary | ICD-10-CM | POA: Diagnosis not present

## 2019-05-15 LAB — POCT INR: INR: 2 (ref 2.0–3.0)

## 2019-05-15 NOTE — Patient Instructions (Addendum)
Pre visit review using our clinic review tool, if applicable. No additional management support is needed unless otherwise documented below in the visit note.  Patient is to continue taking 5 mg daily. Recheck in 6 weeks at patient request. Dosing instructions left on patient's VM.

## 2019-05-16 ENCOUNTER — Other Ambulatory Visit: Payer: Medicare Other

## 2019-05-17 ENCOUNTER — Telehealth: Payer: Self-pay | Admitting: Internal Medicine

## 2019-05-17 ENCOUNTER — Encounter: Payer: Self-pay | Admitting: Internal Medicine

## 2019-05-17 ENCOUNTER — Ambulatory Visit (INDEPENDENT_AMBULATORY_CARE_PROVIDER_SITE_OTHER): Payer: Medicare Other | Admitting: Internal Medicine

## 2019-05-17 DIAGNOSIS — M5136 Other intervertebral disc degeneration, lumbar region: Secondary | ICD-10-CM | POA: Diagnosis not present

## 2019-05-17 DIAGNOSIS — F1120 Opioid dependence, uncomplicated: Secondary | ICD-10-CM | POA: Diagnosis not present

## 2019-05-17 NOTE — Progress Notes (Signed)
Subjective:    Patient ID: Mark Padilla, male    DOB: [birth year], 84 y.o.   MRN: [id number]  HPI Visit for follow up of chronic narcotic dependence  Interactive audio and video telecommunications were attempted between this provider and patient, however failed, due to patient having technical difficulties OR patient did not have access to video capability.  We continued and completed visit with audio only.   Virtual Visit via Telephone Note  I connected with Mark Padilla on 05/17/19 at 11:00 AM EDT by telephone and verified that I am speaking with the correct person using two identifiers.  Location: Patient: home Provider: office   I discussed the limitations, risks, security and privacy concerns of performing an evaluation and management service by telephone and the availability of in person appointments. I also discussed with the patient that there may be a patient responsible charge related to this service. The patient expressed understanding and agreed to proceed.   History of Present Illness: He is doing okay Recent scare--thought daughter had a stroke Rushed to hospital--- transferred to [facility] (was just a medication reaction fortunately)  Continues to use the oxycodone  Generally takes it in the morning when he gets up Then able to do his normal activities (garden, etc) Did try stopping it for 7-8 days---pain was so bad that he couldn't do his normal activities Had to just lie Restarting it--able to do his normal activities (though gardening does increase his pain) Rarely uses a second one  Uses the xanax just once in a while This will help his back if the pain is severe at times Not very often  Sleeps okay Avoids fluids after dinner to avoid nocturia Tries to make up for it at other times Appetite is fine  Current Outpatient Medications on File Prior to Visit  Medication Sig Dispense Refill  . ALPRAZolam (XANAX) 1 MG tablet TAKE 1 TABLET BY MOUTH TWO  TIMES  DAILY AS NEEDED 180 tablet 0  . Ascorbic Acid (VITAMIN C) 500 MG tablet Take 500 mg by mouth daily.      Marland Kitchen atorvastatin (LIPITOR) 10 MG tablet TAKE 1 TABLET BY MOUTH  DAILY 90 tablet 3  . azelastine (ASTELIN) 0.1 % nasal spray INSTILL 2 SPRAYS INTO BOTH  NOSTRILS AT BEDTIME. 60 mL 5  . [facility] SENNA PLUS 8.6-50 MG per tablet TAKE 2 TABLETS BY MOUTH TWICE A DAY 180 tablet 7  . fluticasone (FLONASE) 50 MCG/ACT nasal spray USE 2 SPRAYS IN EACH  NOSTRIL DAILY 48 g 3  . gabapentin (NEURONTIN) 300 MG capsule TAKE 1 CAPSULE BY MOUTH 3  TIMES DAILY 270 capsule 3  . hydrochlorothiazide (HYDRODIURIL) 25 MG tablet TAKE 1 TABLET BY MOUTH  DAILY 90 tablet 3  . loratadine (CLARITIN) 10 MG tablet Take 1 tablet (10 mg total) by mouth daily. 90 tablet 3  . meloxicam (MOBIC) 7.5 MG tablet TAKE 1 TABLET BY MOUTH  DAILY 90 tablet 3  . montelukast (SINGULAIR) 10 MG tablet TAKE 1 TABLET BY MOUTH AT  BEDTIME 90 tablet 3  . Multiple Vitamin (MULTIVITAMIN) capsule Take 1 capsule by mouth daily.      Marland Kitchen omeprazole (PRILOSEC) 40 MG capsule TAKE 1 CAPSULE BY MOUTH  DAILY 90 capsule 3  . oxyCODONE-acetaminophen (PERCOCET/ROXICET) 5-325 MG tablet Take 1 tablet by mouth 2 (two) times daily as needed. 60 tablet 0  . Potassium 75 MG TABS Take by mouth daily.      . tamsulosin (FLOMAX) 0.4 MG CAPS  capsule TAKE 1 CAPSULE BY MOUTH  DAILY 90 capsule 3  . warfarin (COUMADIN) 5 MG tablet TAKE AS DIRECTED BY  COUMADIN CLINIC 90 tablet 3   No current facility-administered medications on file prior to visit.     Allergies  Allergen Reactions  . Simvastatin     Listlessness, fatigue  . Mucinex D [Pseudoephedrine-Guaifenesin Er]   . Undecylenic Acid   . Flexeril [Cyclobenzaprine Hcl] Anxiety  . Skelaxin Anxiety    Past Medical History:  Diagnosis Date  . Allergic rhinitis   . Atrial fibrillation ([place])    chronic persistent, failed DCCV x3 in 2000  . BPH (benign prostatic hypertrophy)   . Cervical disc disease   .  Diverticulitis   . ED (erectile dysfunction)   . GERD (gastroesophageal reflux disease)   . Hyperlipidemia   . Hypertension   . Impaired fasting glucose   . Lumbar disc disease   . Osteoarthritis   . Sleep disturbance     Past Surgical History:  Procedure Laterality Date  . CATARACT EXTRACTION, BILATERAL  2014  . COLONOSCOPY  2013  . HEMIARTHROPLASTY SHOULDER FRACTURE  10/2004   left  . JOINT REPLACEMENT  4/12   Left hip--Dr Jefm Raulston  . KNEE ARTHROSCOPY  06/1998   right Jefm Olson)  . LUMBAR LAMINECTOMY  06/10/13   [facility]  . REPLACEMENT UNICONDYLAR JOINT KNEE  8/12   Dr Jefm Alf  . SHOULDER SURGERY  1950's   dislocation  . SHOULDER SURGERY  12/2004   right  . THR--left  4/12   Dr Jefm Cercone    Family History  Problem Relation Age of Onset  . Stomach cancer Mother   . Stroke Father   . Colon polyps Brother   . Colon cancer Neg Hx     Social History   Socioeconomic History  . Marital status: Divorced    Spouse name: Not on file  . Number of children: 2  . Years of education: Not on file  . Highest education level: Not on file  Occupational History  . Occupation: retired, [employer] works part time [employer]: RETIRED  Social Needs  . Financial resource strain: Not on file  . Food insecurity    Worry: Not on file    Inability: Not on file  . Transportation needs    Medical: Not on file    Non-medical: Not on file  Tobacco Use  . Smoking status: Former Smoker    Types: Cigarettes    Quit date: 11/21/1962    Years since quitting: 56.5  . Smokeless tobacco: Never Used  Substance and Sexual Activity  . Alcohol use: Yes    Alcohol/week: 0.0 standard drinks    Comment: occasional  . Drug use: No  . Sexual activity: Not on file  Lifestyle  . Physical activity    Days per week: Not on file    Minutes per session: Not on file  . Stress: Not on file  Relationships  . Social Herbalist on phone: Not on file    Gets together: Not on  file    Attends religious service: Not on file    Active member of club or organization: Not on file    Attends meetings of clubs or organizations: Not on file    Relationship status: Not on file  . Intimate partner violence    Fear of current or ex partner: Not on file    Emotionally abused: Not on file  Physically abused: Not on file    Forced sexual activity: Not on file  Other Topics Concern  . Not on file  Social History Narrative   Plans to do living will   Will designate daughter [name] as health care POA.    Would accept resuscitation but no prolonged artificial ventilation   Would not want tube feeds if cognitively unaware     Observations/Objective: Normal conversation No mood issues  Assessment and Plan: See problem list  Follow Up Instructions:    I discussed the assessment and treatment plan with the patient. The patient was provided an opportunity to ask questions and all were answered. The patient agreed with the plan and demonstrated an understanding of the instructions.   The patient was advised to call back or seek an in-person evaluation if the symptoms worsen or if the condition fails to improve as anticipated.  I provided 9 minutes of non-face-to-face time during this encounter.   Viviana Simpler, MD    Review of Systems     Objective:   Physical Exam         Assessment & Plan:

## 2019-05-17 NOTE — Assessment & Plan Note (Signed)
PDMP review shows no concers

## 2019-05-17 NOTE — Assessment & Plan Note (Signed)
Pain is ongoing but he tries to garden still and keeps up with house hold tasks Did not do well stopping the oxycodone

## 2019-05-17 NOTE — Telephone Encounter (Signed)
I left a message on patient's voice mail to call back.  Patient needs to schedule a 3 month f/u with Dr.Letvak the beginning of October.

## 2019-06-19 ENCOUNTER — Other Ambulatory Visit: Payer: Self-pay

## 2019-06-19 MED ORDER — OXYCODONE-ACETAMINOPHEN 5-325 MG PO TABS: 1.0000 | ORAL_TABLET | Freq: Two times a day (BID) | ORAL | 0 refills | Status: DC | PRN

## 2019-06-19 NOTE — Telephone Encounter (Signed)
Name of Medication: oxycodone apap 5-325 mg Name of Pharmacy: [facility] [address] Last [place] or Written Date and Quantity: #60 on 04/19/19 Last Office Visit and Type:05/17/19 for 3 mth FU Next Office Visit and Type: 08/27/19 for 3 mth FU Last Controlled Substance Agreement Date: 02/05/19 Last UDS:01/29/19

## 2019-06-27 ENCOUNTER — Other Ambulatory Visit: Payer: Self-pay

## 2019-06-27 ENCOUNTER — Ambulatory Visit (INDEPENDENT_AMBULATORY_CARE_PROVIDER_SITE_OTHER): Payer: Medicare Other | Admitting: General Practice

## 2019-06-27 ENCOUNTER — Ambulatory Visit (INDEPENDENT_AMBULATORY_CARE_PROVIDER_SITE_OTHER): Payer: Medicare Other | Admitting: Internal Medicine

## 2019-06-27 ENCOUNTER — Encounter: Payer: Self-pay | Admitting: Internal Medicine

## 2019-06-27 DIAGNOSIS — Z5181 Encounter for therapeutic drug level monitoring: Secondary | ICD-10-CM | POA: Diagnosis not present

## 2019-06-27 DIAGNOSIS — I4891 Unspecified atrial fibrillation: Secondary | ICD-10-CM

## 2019-06-27 DIAGNOSIS — L989 Disorder of the skin and subcutaneous tissue, unspecified: Secondary | ICD-10-CM

## 2019-06-27 LAB — POCT INR: INR: 2.1 (ref 2.0–3.0)

## 2019-06-27 NOTE — Progress Notes (Signed)
Subjective:    Patient ID: Mark Padilla, male    DOB: 20-Jan-1935, 84 y.o.   MRN: [id number]  HPI Here due to problem with his finger---5th right  First had problems 6 months ago Had growth and then would come off if he peeled---but never back to normal Now much larger Bled this time--when he peeled it off  Current Outpatient Medications on File Prior to Visit  Medication Sig Dispense Refill  . ALPRAZolam (XANAX) 1 MG tablet TAKE 1 TABLET BY MOUTH TWO  TIMES DAILY AS NEEDED 180 tablet 0  . Ascorbic Acid (VITAMIN C) 500 MG tablet Take 500 mg by mouth daily.      Marland Kitchen atorvastatin (LIPITOR) 10 MG tablet TAKE 1 TABLET BY MOUTH  DAILY 90 tablet 3  . azelastine (ASTELIN) 0.1 % nasal spray INSTILL 2 SPRAYS INTO BOTH  NOSTRILS AT BEDTIME. 60 mL 5  . [facility] SENNA PLUS 8.6-50 MG per tablet TAKE 2 TABLETS BY MOUTH TWICE A DAY 180 tablet 7  . fluticasone (FLONASE) 50 MCG/ACT nasal spray USE 2 SPRAYS IN EACH  NOSTRIL DAILY 48 g 3  . gabapentin (NEURONTIN) 300 MG capsule TAKE 1 CAPSULE BY MOUTH 3  TIMES DAILY 270 capsule 3  . hydrochlorothiazide (HYDRODIURIL) 25 MG tablet TAKE 1 TABLET BY MOUTH  DAILY 90 tablet 3  . loratadine (CLARITIN) 10 MG tablet Take 1 tablet (10 mg total) by mouth daily. 90 tablet 3  . meloxicam (MOBIC) 7.5 MG tablet TAKE 1 TABLET BY MOUTH  DAILY 90 tablet 3  . montelukast (SINGULAIR) 10 MG tablet TAKE 1 TABLET BY MOUTH AT  BEDTIME 90 tablet 3  . Multiple Vitamin (MULTIVITAMIN) capsule Take 1 capsule by mouth daily.      Marland Kitchen omeprazole (PRILOSEC) 40 MG capsule TAKE 1 CAPSULE BY MOUTH  DAILY 90 capsule 3  . oxyCODONE-acetaminophen (PERCOCET/ROXICET) 5-325 MG tablet Take 1 tablet by mouth 2 (two) times daily as needed. 60 tablet 0  . Potassium 75 MG TABS Take by mouth daily.      . tamsulosin (FLOMAX) 0.4 MG CAPS capsule TAKE 1 CAPSULE BY MOUTH  DAILY 90 capsule 3  . warfarin (COUMADIN) 5 MG tablet TAKE AS DIRECTED BY  COUMADIN CLINIC 90 tablet 3   No current facility-administered  medications on file prior to visit.     Allergies  Allergen Reactions  . Simvastatin     Listlessness, fatigue  . Mucinex D [Pseudoephedrine-Guaifenesin Er]   . Undecylenic Acid   . Flexeril [Cyclobenzaprine Hcl] Anxiety  . Skelaxin Anxiety    Past Medical History:  Diagnosis Date  . Allergic rhinitis   . Atrial fibrillation ([place])    chronic persistent, failed DCCV x3 in 2000  . BPH (benign prostatic hypertrophy)   . Cervical disc disease   . Diverticulitis   . ED (erectile dysfunction)   . GERD (gastroesophageal reflux disease)   . Hyperlipidemia   . Hypertension   . Impaired fasting glucose   . Lumbar disc disease   . Osteoarthritis   . Sleep disturbance     Past Surgical History:  Procedure Laterality Date  . CATARACT EXTRACTION, BILATERAL  2014  . COLONOSCOPY  2013  . HEMIARTHROPLASTY SHOULDER FRACTURE  10/2004   left  . JOINT REPLACEMENT  4/12   Left hip--Dr Jefm Panico  . KNEE ARTHROSCOPY  06/1998   right Jefm Arutyunyan)  . LUMBAR LAMINECTOMY  06/10/13   [facility]  . REPLACEMENT UNICONDYLAR JOINT KNEE  8/12   Dr Jefm Brightbill  .  SHOULDER SURGERY  1950's   dislocation  . SHOULDER SURGERY  12/2004   right  . THR--left  4/12   Dr Jefm Reichelt    Family History  Problem Relation Age of Onset  . Stomach cancer Mother   . Stroke Father   . Colon polyps Brother   . Colon cancer Neg Hx     Social History   Socioeconomic History  . Marital status: Divorced    Spouse name: Not on file  . Number of children: 2  . Years of education: Not on file  . Highest education level: Not on file  Occupational History  . Occupation: retired, [employer] works part time [employer]: RETIRED  Social Needs  . Financial resource strain: Not on file  . Food insecurity    Worry: Not on file    Inability: Not on file  . Transportation needs    Medical: Not on file    Non-medical: Not on file  Tobacco Use  . Smoking status: Former Smoker    Types: Cigarettes    Quit  date: 11/21/1962    Years since quitting: 56.6  . Smokeless tobacco: Never Used  Substance and Sexual Activity  . Alcohol use: Yes    Alcohol/week: 0.0 standard drinks    Comment: occasional  . Drug use: No  . Sexual activity: Not on file  Lifestyle  . Physical activity    Days per week: Not on file    Minutes per session: Not on file  . Stress: Not on file  Relationships  . Social Herbalist on phone: Not on file    Gets together: Not on file    Attends religious service: Not on file    Active member of club or organization: Not on file    Attends meetings of clubs or organizations: Not on file    Relationship status: Not on file  . Intimate partner violence    Fear of current or ex partner: Not on file    Emotionally abused: Not on file    Physically abused: Not on file    Forced sexual activity: Not on file  Other Topics Concern  . Not on file  Social History Narrative   Plans to do living will   Will designate daughter [name] as health care POA.    Would accept resuscitation but no prolonged artificial ventilation   Would not want tube feeds if cognitively unaware   Review of Systems Has itchy spot on left flank he also wants checked Now with an indention No fever    Objective:   Physical Exam  Constitutional: He appears well-developed. No distress.  Skin:  Shallow ~1cm ulcer on side of right 5th finger. Easy bleeding No scaling now Fairly clear borders  Left flank is slightly hypopigmented and not true ulcer (doesn't look neoplastic)           Assessment & Plan:

## 2019-06-27 NOTE — Patient Instructions (Signed)
Please set up with the dermatologist---I can send referral if needed.

## 2019-06-27 NOTE — Assessment & Plan Note (Signed)
This is suspicious  Discussed considering cryotherapy--but I think it would be best to see derm He will try to set this up--referral prn

## 2019-06-27 NOTE — Patient Instructions (Addendum)
Pre visit review using our clinic review tool, if applicable. No additional management support is needed unless otherwise documented below in the visit note.  Patient is to continue taking 5 mg daily. Recheck in 6 weeks.

## 2019-07-10 DIAGNOSIS — X32XXXA Exposure to sunlight, initial encounter: Secondary | ICD-10-CM | POA: Diagnosis not present

## 2019-07-10 DIAGNOSIS — D485 Neoplasm of uncertain behavior of skin: Secondary | ICD-10-CM | POA: Diagnosis not present

## 2019-07-10 DIAGNOSIS — D0461 Carcinoma in situ of skin of right upper limb, including shoulder: Secondary | ICD-10-CM | POA: Diagnosis not present

## 2019-07-10 DIAGNOSIS — L570 Actinic keratosis: Secondary | ICD-10-CM | POA: Diagnosis not present

## 2019-07-10 DIAGNOSIS — C44519 Basal cell carcinoma of skin of other part of trunk: Secondary | ICD-10-CM | POA: Diagnosis not present

## 2019-07-15 DIAGNOSIS — Z961 Presence of intraocular lens: Secondary | ICD-10-CM | POA: Diagnosis not present

## 2019-07-15 DIAGNOSIS — H04123 Dry eye syndrome of bilateral lacrimal glands: Secondary | ICD-10-CM | POA: Diagnosis not present

## 2019-07-25 DIAGNOSIS — C4441 Basal cell carcinoma of skin of scalp and neck: Secondary | ICD-10-CM | POA: Diagnosis not present

## 2019-08-05 ENCOUNTER — Telehealth: Payer: Self-pay | Admitting: Internal Medicine

## 2019-08-05 NOTE — Telephone Encounter (Signed)
Noted, thank you

## 2019-08-05 NOTE — Telephone Encounter (Signed)
Patient has an appointment this Thursday to get his coumadin checked.  [place]  I rescheduled patient's appointment to 08/15/19 and let him know if there's a problem with that day Leafy Ro will call him back to reschedule.

## 2019-08-08 ENCOUNTER — Ambulatory Visit: Payer: Medicare Other

## 2019-08-12 ENCOUNTER — Other Ambulatory Visit: Payer: Self-pay | Admitting: Internal Medicine

## 2019-08-15 ENCOUNTER — Ambulatory Visit (INDEPENDENT_AMBULATORY_CARE_PROVIDER_SITE_OTHER): Payer: Medicare Other | Admitting: General Practice

## 2019-08-15 ENCOUNTER — Other Ambulatory Visit: Payer: Self-pay | Admitting: *Deleted

## 2019-08-15 DIAGNOSIS — Z5181 Encounter for therapeutic drug level monitoring: Secondary | ICD-10-CM | POA: Diagnosis not present

## 2019-08-15 DIAGNOSIS — Z23 Encounter for immunization: Secondary | ICD-10-CM | POA: Diagnosis not present

## 2019-08-15 DIAGNOSIS — I4891 Unspecified atrial fibrillation: Secondary | ICD-10-CM

## 2019-08-15 LAB — POCT INR: INR: 1.9 — AB (ref 2.0–3.0)

## 2019-08-15 MED ORDER — OXYCODONE-ACETAMINOPHEN 5-325 MG PO TABS: 1.0000 | ORAL_TABLET | Freq: Two times a day (BID) | ORAL | 0 refills | Status: DC | PRN

## 2019-08-15 NOTE — Patient Instructions (Addendum)
Pre visit review using our clinic review tool, if applicable. No additional management support is needed unless otherwise documented below in the visit note.  Take 1 1/2 tablets today and then change dosage to 1 tablet daily except 1 1/2 tablets on Wednesday only.  Re-check in 4 weeks.

## 2019-08-15 NOTE — Telephone Encounter (Signed)
-----   Message from Venia Carbon, MD sent at 08/15/2019  4:23 PM EDT ----- Regarding: FW: re-fill  ----- Message ----- From: Warden Fillers, RN Sent: 08/15/2019   4:16 PM EDT To: Venia Carbon, MD Subject: re-fill                                        Patient is requesting a re-fill for oxycodone - last re-fill was 7/29.  Please advise.  Please route to [facility] as I will not be available.  Thanks! Jenny Reichmann

## 2019-08-15 NOTE — Telephone Encounter (Signed)
Name of Medication: oxycodone apap 5-325 mg Name of [facility]acy[facility] [address] Last [place] or Written Date and Quantity: #60 on 06/19/19 Last Office Visit and Type:06/27/19 check finger Next Office Visit and Type: 08/27/19 for 3 mth FU Last Controlled Substance Agreement Date: 02/05/19 Last UDS:01/29/19

## 2019-08-27 ENCOUNTER — Encounter: Payer: Self-pay | Admitting: Internal Medicine

## 2019-08-27 ENCOUNTER — Ambulatory Visit (INDEPENDENT_AMBULATORY_CARE_PROVIDER_SITE_OTHER): Payer: Medicare Other | Admitting: Internal Medicine

## 2019-08-27 ENCOUNTER — Other Ambulatory Visit: Payer: Self-pay

## 2019-08-27 VITALS — BP 112/76 | HR 62 | Temp 98.5°F | Ht 65.0 in | Wt 183.0 lb

## 2019-08-27 DIAGNOSIS — R1032 Left lower quadrant pain: Secondary | ICD-10-CM | POA: Diagnosis not present

## 2019-08-27 DIAGNOSIS — F1120 Opioid dependence, uncomplicated: Secondary | ICD-10-CM

## 2019-08-27 DIAGNOSIS — M5136 Other intervertebral disc degeneration, lumbar region: Secondary | ICD-10-CM

## 2019-08-27 NOTE — Assessment & Plan Note (Signed)
Chronic and ongoing Uses the pain meds daily

## 2019-08-27 NOTE — Assessment & Plan Note (Signed)
At the inguinal ligament Has the fairly large hernia on the other side--but none on left Doesn't seem to be in location for diverticular Strange that it hurts at rest Will set up with surgeon to evaluate

## 2019-08-27 NOTE — Assessment & Plan Note (Signed)
No concerns on PDMP 

## 2019-08-27 NOTE — Progress Notes (Signed)
Subjective:    Patient ID: Mark Padilla, male    DOB: 1935-08-28, 84 y.o.   MRN: [id number]  HPI  Here for follow up of chronic pain and narcotic/benzodiazepine dependence  He is also having some pain in left groin Goes back a while--but now more continuous Started months ago He notices it when he is relaxed and sitting around--and in bed as well Felt better if he pushes on it/massages Not related to eating Bowels are mostly regular---unless he misses cereal. No blood  Has right sided hernia--but no tenderness there Doesn't seem to notice with activity Does do his house work--no problem with that (other than the vacuuming really hurting his back)  Still uses the oxycodone once a day mostly. Occasionally twice a day Shoulders ache, knees, and mostly back  Current Outpatient Medications on File Prior to Visit  Medication Sig Dispense Refill  . ALPRAZolam (XANAX) 1 MG tablet TAKE 1 TABLET BY MOUTH TWO  TIMES DAILY AS NEEDED 180 tablet 0  . Ascorbic Acid (VITAMIN C) 500 MG tablet Take 500 mg by mouth daily.      Marland Kitchen atorvastatin (LIPITOR) 10 MG tablet TAKE 1 TABLET BY MOUTH  DAILY 90 tablet 3  . azelastine (ASTELIN) 0.1 % nasal spray INSTILL 2 SPRAYS INTO BOTH  NOSTRILS AT BEDTIME. 60 mL 5  . [facility] SENNA PLUS 8.6-50 MG per tablet TAKE 2 TABLETS BY MOUTH TWICE A DAY 180 tablet 7  . fluticasone (FLONASE) 50 MCG/ACT nasal spray USE 2 SPRAYS IN EACH  NOSTRIL DAILY 48 g 3  . gabapentin (NEURONTIN) 300 MG capsule TAKE 1 CAPSULE BY MOUTH 3  TIMES DAILY 270 capsule 3  . hydrochlorothiazide (HYDRODIURIL) 25 MG tablet TAKE 1 TABLET BY MOUTH  DAILY 90 tablet 3  . loratadine (CLARITIN) 10 MG tablet Take 1 tablet (10 mg total) by mouth daily. 90 tablet 3  . meloxicam (MOBIC) 7.5 MG tablet TAKE 1 TABLET BY MOUTH  DAILY 90 tablet 3  . montelukast (SINGULAIR) 10 MG tablet TAKE 1 TABLET BY MOUTH AT  BEDTIME 90 tablet 3  . Multiple Vitamin (MULTIVITAMIN) capsule Take 1 capsule by mouth daily.      Marland Kitchen  omeprazole (PRILOSEC) 40 MG capsule TAKE 1 CAPSULE BY MOUTH  DAILY 90 capsule 3  . oxyCODONE-acetaminophen (PERCOCET/ROXICET) 5-325 MG tablet Take 1 tablet by mouth 2 (two) times daily as needed. 60 tablet 0  . Potassium 75 MG TABS Take by mouth daily.      . tamsulosin (FLOMAX) 0.4 MG CAPS capsule TAKE 1 CAPSULE BY MOUTH  DAILY 90 capsule 3  . warfarin (COUMADIN) 5 MG tablet TAKE AS DIRECTED BY  COUMADIN CLINIC 90 tablet 3   No current facility-administered medications on file prior to visit.     Allergies  Allergen Reactions  . Simvastatin     Listlessness, fatigue  . Mucinex D [Pseudoephedrine-Guaifenesin Er]   . Undecylenic Acid   . Flexeril [Cyclobenzaprine Hcl] Anxiety  . Skelaxin Anxiety    Past Medical History:  Diagnosis Date  . Allergic rhinitis   . Atrial fibrillation ([place])    chronic persistent, failed DCCV x3 in 2000  . BPH (benign prostatic hypertrophy)   . Cervical disc disease   . Diverticulitis   . ED (erectile dysfunction)   . GERD (gastroesophageal reflux disease)   . Hyperlipidemia   . Hypertension   . Impaired fasting glucose   . Lumbar disc disease   . Osteoarthritis   . Sleep disturbance  Past Surgical History:  Procedure Laterality Date  . CATARACT EXTRACTION, BILATERAL  2014  . COLONOSCOPY  2013  . HEMIARTHROPLASTY SHOULDER FRACTURE  10/2004   left  . JOINT REPLACEMENT  4/12   Left hip--Dr Jefm Dickerson  . KNEE ARTHROSCOPY  06/1998   right Jefm Keep)  . LUMBAR LAMINECTOMY  06/10/13   [facility]  . REPLACEMENT UNICONDYLAR JOINT KNEE  8/12   Dr Jefm Bell  . SHOULDER SURGERY  1950's   dislocation  . SHOULDER SURGERY  12/2004   right  . THR--left  4/12   Dr Jefm Greathouse    Family History  Problem Relation Age of Onset  . Stomach cancer Mother   . Stroke Father   . Colon polyps Brother   . Colon cancer Neg Hx     Social History   Socioeconomic History  . Marital status: Divorced    Spouse name: Not on file  . Number of children: 2  .  Years of education: Not on file  . Highest education level: Not on file  Occupational History  . Occupation: retired, [employer] works part time [employer]: RETIRED  Social Needs  . Financial resource strain: Not on file  . Food insecurity    Worry: Not on file    Inability: Not on file  . Transportation needs    Medical: Not on file    Non-medical: Not on file  Tobacco Use  . Smoking status: Former Smoker    Types: Cigarettes    Quit date: 11/21/1962    Years since quitting: 56.8  . Smokeless tobacco: Never Used  Substance and Sexual Activity  . Alcohol use: Yes    Alcohol/week: 0.0 standard drinks    Comment: occasional  . Drug use: No  . Sexual activity: Not on file  Lifestyle  . Physical activity    Days per week: Not on file    Minutes per session: Not on file  . Stress: Not on file  Relationships  . Social Herbalist on phone: Not on file    Gets together: Not on file    Attends religious service: Not on file    Active member of club or organization: Not on file    Attends meetings of clubs or organizations: Not on file    Relationship status: Not on file  . Intimate partner violence    Fear of current or ex partner: Not on file    Emotionally abused: Not on file    Physically abused: Not on file    Forced sexual activity: Not on file  Other Topics Concern  . Not on file  Social History Narrative   Plans to do living will   Will designate daughter [name] as health care POA.    Would accept resuscitation but no prolonged artificial ventilation   Would not want tube feeds if cognitively unaware   Review of Systems Appetite is okay Weight stable Usually sleeps okay Some problems with balance--discussed his psychoactive meds  Mostly just uses the xanax before bedtime    Objective:   Physical Exam  GI: He exhibits no distension. There is no abdominal tenderness. There is no rebound and no guarding.  No tenderness in  abdomen--seems to be more in the groin  Genitourinary:    Genitourinary Comments: Reducible right inguinal hernia Tenderness is at point of left inguinal ligament--but no clear hernia there             Assessment &  Plan:

## 2019-09-04 DIAGNOSIS — D099 Carcinoma in situ, unspecified: Secondary | ICD-10-CM | POA: Diagnosis not present

## 2019-09-04 DIAGNOSIS — Z85828 Personal history of other malignant neoplasm of skin: Secondary | ICD-10-CM | POA: Diagnosis not present

## 2019-09-05 ENCOUNTER — Other Ambulatory Visit: Payer: Self-pay | Admitting: Internal Medicine

## 2019-09-06 ENCOUNTER — Ambulatory Visit: Payer: Self-pay | Admitting: Surgery

## 2019-09-12 ENCOUNTER — Ambulatory Visit (INDEPENDENT_AMBULATORY_CARE_PROVIDER_SITE_OTHER): Payer: Medicare Other | Admitting: General Practice

## 2019-09-12 ENCOUNTER — Other Ambulatory Visit: Payer: Self-pay

## 2019-09-12 DIAGNOSIS — Z5181 Encounter for therapeutic drug level monitoring: Secondary | ICD-10-CM

## 2019-09-12 DIAGNOSIS — I4891 Unspecified atrial fibrillation: Secondary | ICD-10-CM

## 2019-09-12 LAB — POCT INR: INR: 1.6 — AB (ref 2.0–3.0)

## 2019-09-12 NOTE — Patient Instructions (Addendum)
Pre visit review using our clinic review tool, if applicable. No additional management support is needed unless otherwise documented below in the visit note.  Take 1 1/2 tablets today and tomorrow (10/22 and 10/23) and then change dosage to 1 tablet daily except 1 1/2 tablets on Sundays and Wednesday only.  Re-check in 3 to 4 weeks.

## 2019-10-03 ENCOUNTER — Ambulatory Visit (INDEPENDENT_AMBULATORY_CARE_PROVIDER_SITE_OTHER): Payer: Medicare Other | Admitting: General Practice

## 2019-10-03 ENCOUNTER — Other Ambulatory Visit: Payer: Self-pay

## 2019-10-03 DIAGNOSIS — I4891 Unspecified atrial fibrillation: Secondary | ICD-10-CM

## 2019-10-03 DIAGNOSIS — Z5181 Encounter for therapeutic drug level monitoring: Secondary | ICD-10-CM

## 2019-10-03 LAB — POCT INR: INR: 3 (ref 2.0–3.0)

## 2019-10-03 NOTE — Patient Instructions (Addendum)
Pre visit review using our clinic review tool, if applicable. No additional management support is needed unless otherwise documented below in the visit note.  Change dosage to 1 tablet daily except 1 1/2 tablets on Sundays.  Re-check in to 4 weeks.

## 2019-10-15 ENCOUNTER — Other Ambulatory Visit: Payer: Self-pay | Admitting: Internal Medicine

## 2019-10-15 MED ORDER — ALPRAZOLAM 1 MG PO TABS: ORAL_TABLET | ORAL | 0 refills | Status: DC

## 2019-10-15 MED ORDER — OXYCODONE-ACETAMINOPHEN 5-325 MG PO TABS: 1.0000 | ORAL_TABLET | Freq: Two times a day (BID) | ORAL | 0 refills | Status: DC | PRN

## 2019-10-15 NOTE — Telephone Encounter (Signed)
Pt is requesting a refill of :  Xanax to [facility]  Oxycodone to [facility]   Last CPE 01/29/2019

## 2019-10-15 NOTE — Telephone Encounter (Signed)
Name of Medication:oxycodone apap 5-325 mg Name of [facility]acy[facility] [place] or Written Date and Quantity:08-15-19#60  Last Office Visit and Type:08-27-19 Next Office Visit and Type:11-26-19 Last Controlled Substance Agreement Date:02/05/19 Last UDS:01/29/19  Alprazolam last written 03-25-19 #180 Send to [facility]

## 2019-10-30 ENCOUNTER — Other Ambulatory Visit: Payer: Self-pay

## 2019-10-31 ENCOUNTER — Ambulatory Visit (INDEPENDENT_AMBULATORY_CARE_PROVIDER_SITE_OTHER): Payer: Medicare Other | Admitting: General Practice

## 2019-10-31 DIAGNOSIS — Z7901 Long term (current) use of anticoagulants: Secondary | ICD-10-CM

## 2019-10-31 LAB — POCT INR: INR: 2.4 (ref 2.0–3.0)

## 2019-10-31 NOTE — Patient Instructions (Signed)
Pre visit review using our clinic review tool, if applicable. No additional management support is needed unless otherwise documented below in the visit note.  Continue taking 1 tablet daily except 1 1/2 tablets on Sundays.  Re-check in to 6 weeks.

## 2019-11-26 ENCOUNTER — Encounter: Payer: Self-pay | Admitting: Internal Medicine

## 2019-11-26 ENCOUNTER — Ambulatory Visit (INDEPENDENT_AMBULATORY_CARE_PROVIDER_SITE_OTHER): Payer: Medicare Other | Admitting: Internal Medicine

## 2019-11-26 ENCOUNTER — Other Ambulatory Visit: Payer: Self-pay

## 2019-11-26 ENCOUNTER — Telehealth: Payer: Self-pay | Admitting: Internal Medicine

## 2019-11-26 DIAGNOSIS — F1320 Sedative, hypnotic or anxiolytic dependence, uncomplicated: Secondary | ICD-10-CM | POA: Diagnosis not present

## 2019-11-26 DIAGNOSIS — M51369 Other intervertebral disc degeneration, lumbar region without mention of lumbar back pain or lower extremity pain: Secondary | ICD-10-CM

## 2019-11-26 DIAGNOSIS — J4520 Mild intermittent asthma, uncomplicated: Secondary | ICD-10-CM | POA: Diagnosis not present

## 2019-11-26 DIAGNOSIS — M5136 Other intervertebral disc degeneration, lumbar region: Secondary | ICD-10-CM

## 2019-11-26 DIAGNOSIS — F1120 Opioid dependence, uncomplicated: Secondary | ICD-10-CM | POA: Diagnosis not present

## 2019-11-26 DIAGNOSIS — J45909 Unspecified asthma, uncomplicated: Secondary | ICD-10-CM | POA: Insufficient documentation

## 2019-11-26 MED ORDER — ALBUTEROL SULFATE HFA 108 (90 BASE) MCG/ACT IN AERS: 2.0000 | INHALATION_SPRAY | Freq: Four times a day (QID) | RESPIRATORY_TRACT | 1 refills | Status: DC | PRN

## 2019-11-26 MED ORDER — AMOXICILLIN-POT CLAVULANATE 875-125 MG PO TABS: 1.0000 | ORAL_TABLET | Freq: Two times a day (BID) | ORAL | 0 refills | Status: DC

## 2019-11-26 NOTE — Assessment & Plan Note (Signed)
Not sure how he came in today----through the screening process Out the side door and would not check CXR Asked him to get COVID tested--definitely could have it Will treat with augmentin---and albuterol Consider prednisone if worsens ER if sig dyspnea

## 2019-11-26 NOTE — Telephone Encounter (Signed)
I left a message on patient's voice mail to return my call.  Dr.Letvak's requesting patient schedule a Medicare Wellness Visit in 3 months.

## 2019-11-26 NOTE — Progress Notes (Signed)
Subjective:    Patient ID: Mark Padilla, male    DOB: 18-Sep-1935, 85 y.o.   MRN: [id number]  HPI Here for follow up of chronic pain and narcotic/benzodiazepine dependence  Has had some head congestion Now feels it in his chest/bronchial tubes since yesterday Productive of yellowish sputum in the morning Now hears whistle in bronchial tubes No fever Some SOB---chronic (he feels due to age and not being able to walk as much) Prednisone and albuterol helped in the past as well  Pain is about the same Still uses the oxycodone  Current Outpatient Medications on File Prior to Visit  Medication Sig Dispense Refill  . ALPRAZolam (XANAX) 1 MG tablet TAKE 1 TABLET BY MOUTH TWO  TIMES DAILY AS NEEDED 180 tablet 0  . Ascorbic Acid (VITAMIN C) 500 MG tablet Take 500 mg by mouth daily.      Marland Kitchen atorvastatin (LIPITOR) 10 MG tablet TAKE 1 TABLET BY MOUTH  DAILY 90 tablet 3  . azelastine (ASTELIN) 0.1 % nasal spray INSTILL 2 SPRAYS INTO BOTH  NOSTRILS AT BEDTIME. 60 mL 5  . [facility] SENNA PLUS 8.6-50 MG per tablet TAKE 2 TABLETS BY MOUTH TWICE A DAY 180 tablet 7  . fluticasone (FLONASE) 50 MCG/ACT nasal spray USE 2 SPRAYS IN EACH  NOSTRIL DAILY 48 g 3  . gabapentin (NEURONTIN) 300 MG capsule TAKE 1 CAPSULE BY MOUTH 3  TIMES DAILY 270 capsule 3  . hydrochlorothiazide (HYDRODIURIL) 25 MG tablet TAKE 1 TABLET BY MOUTH  DAILY 90 tablet 3  . loratadine (CLARITIN) 10 MG tablet Take 1 tablet (10 mg total) by mouth daily. 90 tablet 3  . meloxicam (MOBIC) 7.5 MG tablet TAKE 1 TABLET BY MOUTH  DAILY 90 tablet 3  . montelukast (SINGULAIR) 10 MG tablet TAKE 1 TABLET BY MOUTH AT  BEDTIME 90 tablet 3  . Multiple Vitamin (MULTIVITAMIN) capsule Take 1 capsule by mouth daily.      Marland Kitchen omeprazole (PRILOSEC) 40 MG capsule TAKE 1 CAPSULE BY MOUTH  DAILY 90 capsule 3  . oxyCODONE-acetaminophen (PERCOCET/ROXICET) 5-325 MG tablet Take 1 tablet by mouth 2 (two) times daily as needed. 60 tablet 0  . Potassium 75 MG TABS Take by  mouth daily.      . tamsulosin (FLOMAX) 0.4 MG CAPS capsule TAKE 1 CAPSULE BY MOUTH  DAILY 90 capsule 3  . warfarin (COUMADIN) 5 MG tablet TAKE AS DIRECTED BY  COUMADIN CLINIC 90 tablet 3   No current facility-administered medications on file prior to visit.    Allergies  Allergen Reactions  . Simvastatin     Listlessness, fatigue  . Mucinex D [Pseudoephedrine-Guaifenesin Er]   . Undecylenic Acid   . Flexeril [Cyclobenzaprine Hcl] Anxiety  . Skelaxin Anxiety    Past Medical History:  Diagnosis Date  . Allergic rhinitis   . Atrial fibrillation ([place])    chronic persistent, failed DCCV x3 in 2000  . BPH (benign prostatic hypertrophy)   . Cervical disc disease   . Diverticulitis   . ED (erectile dysfunction)   . GERD (gastroesophageal reflux disease)   . Hyperlipidemia   . Hypertension   . Impaired fasting glucose   . Lumbar disc disease   . Osteoarthritis   . Sleep disturbance     Past Surgical History:  Procedure Laterality Date  . CATARACT EXTRACTION, BILATERAL  2014  . COLONOSCOPY  2013  . HEMIARTHROPLASTY SHOULDER FRACTURE  10/2004   left  . JOINT REPLACEMENT  4/12   Left  hip--Dr Jefm Bransfield  . KNEE ARTHROSCOPY  06/1998   right Jefm Landrus)  . LUMBAR LAMINECTOMY  06/10/13   [facility]  . REPLACEMENT UNICONDYLAR JOINT KNEE  8/12   Dr Jefm Walck  . SHOULDER SURGERY  1950's   dislocation  . SHOULDER SURGERY  12/2004   right  . THR--left  4/12   Dr Jefm Nappi    Family History  Problem Relation Age of Onset  . Stomach cancer Mother   . Stroke Father   . Colon polyps Brother   . Colon cancer Neg Hx     Social History   Socioeconomic History  . Marital status: Divorced    Spouse name: Not on file  . Number of children: 2  . Years of education: Not on file  . Highest education level: Not on file  Occupational History  . Occupation: retired, [employer] works part time [employer]: RETIRED  Tobacco Use  . Smoking status: Former Smoker    Types:  Cigarettes    Quit date: 11/21/1962    Years since quitting: 57.0  . Smokeless tobacco: Never Used  Substance and Sexual Activity  . Alcohol use: Yes    Alcohol/week: 0.0 standard drinks    Comment: occasional  . Drug use: No  . Sexual activity: Not on file  Other Topics Concern  . Not on file  Social History Narrative   Plans to do living will   Will designate daughter [name] as health care POA.    Would accept resuscitation but no prolonged artificial ventilation   Would not want tube feeds if cognitively unaware   Social Determinants of Health   Financial Resource Strain:   . Difficulty of Paying Living Expenses: Not on file  Food Insecurity:   . Worried About Charity fundraiser in the Last Year: Not on file  . Ran Out of Food in the Last Year: Not on file  Transportation Needs:   . Lack of Transportation (Medical): Not on file  . Lack of Transportation (Non-Medical): Not on file  Physical Activity:   . Days of Exercise per Week: Not on file  . Minutes of Exercise per Session: Not on file  Stress:   . Feeling of Stress : Not on file  Social Connections:   . Frequency of Communication with Friends and Family: Not on file  . Frequency of Social Gatherings with Friends and Family: Not on file  . Attends Religious Services: Not on file  . Active Member of Clubs or Organizations: Not on file  . Attends Archivist Meetings: Not on file  . Marital Status: Not on file  Intimate Partner Violence:   . Fear of Current or Ex-Partner: Not on file  . Emotionally Abused: Not on file  . Physically Abused: Not on file  . Sexually Abused: Not on file   Review of Systems Sleeping some better--uses the xanax at night Appetite is okay    Objective:   Physical Exam  Constitutional: He appears well-developed. No distress.  HENT:  Mild pharyngeal injection Nasal inflammation and bleeding spot on right  Neck: No thyromegaly present.  Cardiovascular: Normal rate, regular  rhythm and normal heart sounds. Exam reveals no gallop.  No murmur heard. Respiratory:  Slightly decreased breath sounds Not tight but slight exp rhonchi No crackles or wheezing  Lymphadenopathy:    He has no cervical adenopathy.  Psychiatric: He has a normal mood and affect. His behavior is normal.  Assessment & Plan:

## 2019-11-26 NOTE — Patient Instructions (Signed)
Please text COVID to [phone] to find out about getting tested for COVID

## 2019-11-26 NOTE — Assessment & Plan Note (Signed)
PDMP reviewed No concerns 

## 2019-11-26 NOTE — Assessment & Plan Note (Signed)
Mostly for sleep PDMP okay

## 2019-11-26 NOTE — Assessment & Plan Note (Signed)
Ongoing issues but stays fairly functional with the oxycodone

## 2019-11-28 ENCOUNTER — Ambulatory Visit: Payer: Medicare Other | Attending: Internal Medicine

## 2019-11-28 DIAGNOSIS — Z20822 Contact with and (suspected) exposure to covid-19: Secondary | ICD-10-CM

## 2019-11-30 ENCOUNTER — Telehealth: Payer: Self-pay | Admitting: Internal Medicine

## 2019-11-30 LAB — NOVEL CORONAVIRUS, NAA: SARS-CoV-2, NAA: NOT DETECTED

## 2019-11-30 NOTE — Telephone Encounter (Signed)
Pt aware covid lab test negative, not detected °

## 2019-12-05 ENCOUNTER — Encounter: Payer: Self-pay | Admitting: *Deleted

## 2019-12-10 ENCOUNTER — Telehealth: Payer: Self-pay | Admitting: *Deleted

## 2019-12-10 NOTE — Telephone Encounter (Signed)
I did not think insurances were covering lift chairs (though tax would be excluded if you have a prescription). I did write a prescription for him--just in case. Find out if he wants it mailed to him

## 2019-12-10 NOTE — Telephone Encounter (Signed)
Patient left a voicemail stating that he needs to get an electric lift chair. Patient stated that he needs a script from Dr. Silvio Pate for his insurance company. Patient requested a call back when this is done.

## 2019-12-10 NOTE — Telephone Encounter (Signed)
Pt returned call. He will pick up on 1/21 when he comes in for labs. Pt verbalized understanding

## 2019-12-10 NOTE — Telephone Encounter (Signed)
LVM

## 2019-12-11 ENCOUNTER — Other Ambulatory Visit: Payer: Self-pay

## 2019-12-11 NOTE — Telephone Encounter (Signed)
Rx placed at front office in blue folders.

## 2019-12-11 NOTE — Telephone Encounter (Signed)
Contacted pt to screen for coumadin clinic apt tomorrow. Pt requests refill of oxycodone. Advised a msg would be sent to PCP. Pt verbalized understanding.  Last fill 10/15/19 Last OV 11/26/19 Pt compliant with coumadin management.

## 2019-12-12 ENCOUNTER — Ambulatory Visit (INDEPENDENT_AMBULATORY_CARE_PROVIDER_SITE_OTHER): Payer: Medicare Other

## 2019-12-12 ENCOUNTER — Other Ambulatory Visit: Payer: Self-pay

## 2019-12-12 ENCOUNTER — Telehealth: Payer: Self-pay

## 2019-12-12 DIAGNOSIS — Z7901 Long term (current) use of anticoagulants: Secondary | ICD-10-CM | POA: Diagnosis not present

## 2019-12-12 LAB — POCT INR: INR: 2 (ref 2.0–3.0)

## 2019-12-12 MED ORDER — PREDNISONE 20 MG PO TABS: 40.0000 mg | ORAL_TABLET | Freq: Every day | ORAL | 0 refills | Status: DC

## 2019-12-12 MED ORDER — OXYCODONE-ACETAMINOPHEN 5-325 MG PO TABS: 1.0000 | ORAL_TABLET | Freq: Two times a day (BID) | ORAL | 0 refills | Status: DC | PRN

## 2019-12-12 NOTE — Telephone Encounter (Signed)
LVM for pt to call for PCP msg. Please send call to this nurse because coumadin clinic apt will also need adjusted.

## 2019-12-12 NOTE — Patient Instructions (Addendum)
Pre visit review using our clinic review tool, if applicable. No additional management support is needed unless otherwise documented below in the visit note.  Continue taking 1 tablet daily except 1 1/2 tablets on Sundays.  Re-check in to 6 weeks.

## 2019-12-12 NOTE — Telephone Encounter (Signed)
Please let him know that [facility]. Not sure if you want to check his INR sooner then

## 2019-12-12 NOTE — Telephone Encounter (Signed)
Patient would like to know if something could be called in for his congestion.

## 2019-12-12 NOTE — Telephone Encounter (Signed)
This has been taken care of in another telephone encounter today.

## 2019-12-12 NOTE — Telephone Encounter (Signed)
Pt in today for coumadin clinic. C/o continued chest congestion and unproductive cough at night. Last seen on 1/5 for asthmatic bronchitis. Prescribed amoxicillin and albuterol inhaler. Pt also reports he does not want anymore amoxicillin due to diarrhea. Advised a msg would be sent to PCP and this nurse would f/u. Pt verbalized understanding.  Copied last assessment and plan note below from 11/26/19 for your review. Assessment & Plan Note by Venia Carbon, MD at 11/26/2019 10:55 AM  Author: Venia Carbon, MD Author Type: Physician Filed: 11/26/2019 10:56 AM  Note Status: Written Cosign: Cosign Not Required Encounter Date: 11/26/2019  Problem: Asthmatic bronchitis  Editor: Venia Carbon, MD (Physician)    Not sure how he came in today----through the screening process Out the side door and would not check CXR Asked him to get COVID tested--definitely could have it Will treat with augmentin---and albuterol Consider prednisone if worsens ER if sig dyspnea

## 2019-12-13 NOTE — Telephone Encounter (Signed)
Pt returned call. Advised of script and need to move INR apt to sooner date. Pt agreed and verbalized understanding.

## 2019-12-19 ENCOUNTER — Ambulatory Visit (INDEPENDENT_AMBULATORY_CARE_PROVIDER_SITE_OTHER): Payer: Medicare Other

## 2019-12-19 ENCOUNTER — Other Ambulatory Visit: Payer: Self-pay

## 2019-12-19 ENCOUNTER — Ambulatory Visit: Payer: Medicare Other

## 2019-12-19 DIAGNOSIS — Z7901 Long term (current) use of anticoagulants: Secondary | ICD-10-CM | POA: Diagnosis not present

## 2019-12-19 LAB — POCT INR: INR: 2.5 (ref 2.0–3.0)

## 2019-12-19 NOTE — Patient Instructions (Addendum)
Pre visit review using our clinic review tool, if applicable. No additional management support is needed unless otherwise documented below in the visit note.  Continue taking 1 tablet daily except 1 1/2 tablets on Sundays.  Re-check in to 6 weeks.

## 2019-12-28 DIAGNOSIS — Z7901 Long term (current) use of anticoagulants: Secondary | ICD-10-CM | POA: Diagnosis not present

## 2019-12-28 DIAGNOSIS — B9689 Other specified bacterial agents as the cause of diseases classified elsewhere: Secondary | ICD-10-CM | POA: Diagnosis not present

## 2019-12-28 DIAGNOSIS — J019 Acute sinusitis, unspecified: Secondary | ICD-10-CM | POA: Diagnosis not present

## 2019-12-28 DIAGNOSIS — J441 Chronic obstructive pulmonary disease with (acute) exacerbation: Secondary | ICD-10-CM | POA: Diagnosis not present

## 2019-12-28 DIAGNOSIS — J209 Acute bronchitis, unspecified: Secondary | ICD-10-CM | POA: Diagnosis not present

## 2019-12-31 ENCOUNTER — Telehealth: Payer: Self-pay

## 2019-12-31 MED ORDER — ALBUTEROL SULFATE HFA 108 (90 BASE) MCG/ACT IN AERS: 2.0000 | INHALATION_SPRAY | Freq: Four times a day (QID) | RESPIRATORY_TRACT | 3 refills | Status: DC | PRN

## 2019-12-31 NOTE — Telephone Encounter (Signed)
[facility] Ventolin is not covered by insurance.

## 2020-01-23 ENCOUNTER — Ambulatory Visit: Payer: Medicare Other

## 2020-01-30 ENCOUNTER — Telehealth: Payer: Self-pay

## 2020-01-30 ENCOUNTER — Other Ambulatory Visit: Payer: Self-pay

## 2020-01-30 ENCOUNTER — Ambulatory Visit (INDEPENDENT_AMBULATORY_CARE_PROVIDER_SITE_OTHER): Payer: Medicare Other

## 2020-01-30 DIAGNOSIS — Z7901 Long term (current) use of anticoagulants: Secondary | ICD-10-CM

## 2020-01-30 LAB — POCT INR: INR: 2.4 (ref 2.0–3.0)

## 2020-01-30 NOTE — Patient Instructions (Addendum)
Pre visit review using our clinic review tool, if applicable. No additional management support is needed unless otherwise documented below in the visit note.  Continue taking 1 tablet daily except 1 1/2 tablets on Sundays.  Re-check in to 6 weeks.

## 2020-01-30 NOTE — Telephone Encounter (Signed)
Yes He had an acute illness and if he feels back to normal, he can stop the advair

## 2020-01-30 NOTE — Telephone Encounter (Signed)
Advised pt of PCP msg. Pt verbalized understanding.  

## 2020-01-30 NOTE — Telephone Encounter (Signed)
Pt wants to know if he should continue the Advair inhaler. Pt denies any SOB and reports he thinks he is cleared up from the last sinus infection.

## 2020-02-05 DIAGNOSIS — D485 Neoplasm of uncertain behavior of skin: Secondary | ICD-10-CM | POA: Diagnosis not present

## 2020-02-05 DIAGNOSIS — I788 Other diseases of capillaries: Secondary | ICD-10-CM | POA: Diagnosis not present

## 2020-02-05 DIAGNOSIS — Z85828 Personal history of other malignant neoplasm of skin: Secondary | ICD-10-CM | POA: Diagnosis not present

## 2020-02-05 DIAGNOSIS — L821 Other seborrheic keratosis: Secondary | ICD-10-CM | POA: Diagnosis not present

## 2020-02-05 DIAGNOSIS — Z08 Encounter for follow-up examination after completed treatment for malignant neoplasm: Secondary | ICD-10-CM | POA: Diagnosis not present

## 2020-02-05 DIAGNOSIS — L570 Actinic keratosis: Secondary | ICD-10-CM | POA: Diagnosis not present

## 2020-02-05 DIAGNOSIS — C44319 Basal cell carcinoma of skin of other parts of face: Secondary | ICD-10-CM | POA: Diagnosis not present

## 2020-02-13 DIAGNOSIS — C44319 Basal cell carcinoma of skin of other parts of face: Secondary | ICD-10-CM | POA: Diagnosis not present

## 2020-02-18 ENCOUNTER — Other Ambulatory Visit: Payer: Self-pay | Admitting: Internal Medicine

## 2020-02-25 ENCOUNTER — Ambulatory Visit (INDEPENDENT_AMBULATORY_CARE_PROVIDER_SITE_OTHER): Payer: Medicare Other | Admitting: Internal Medicine

## 2020-02-25 ENCOUNTER — Other Ambulatory Visit: Payer: Self-pay

## 2020-02-25 ENCOUNTER — Encounter: Payer: Self-pay | Admitting: Internal Medicine

## 2020-02-25 VITALS — BP 138/76 | HR 70 | Temp 97.9°F | Ht 65.5 in | Wt 183.0 lb

## 2020-02-25 DIAGNOSIS — Z0000 Encounter for general adult medical examination without abnormal findings: Secondary | ICD-10-CM

## 2020-02-25 DIAGNOSIS — N400 Enlarged prostate without lower urinary tract symptoms: Secondary | ICD-10-CM | POA: Diagnosis not present

## 2020-02-25 DIAGNOSIS — F39 Unspecified mood [affective] disorder: Secondary | ICD-10-CM | POA: Diagnosis not present

## 2020-02-25 DIAGNOSIS — M5136 Other intervertebral disc degeneration, lumbar region: Secondary | ICD-10-CM

## 2020-02-25 DIAGNOSIS — I4819 Other persistent atrial fibrillation: Secondary | ICD-10-CM | POA: Diagnosis not present

## 2020-02-25 DIAGNOSIS — I10 Essential (primary) hypertension: Secondary | ICD-10-CM

## 2020-02-25 DIAGNOSIS — Z7189 Other specified counseling: Secondary | ICD-10-CM

## 2020-02-25 LAB — LIPID PANEL
Cholesterol: 127 mg/dL (ref 0–200)
HDL: 51.4 mg/dL (ref >39.00)
LDL Cholesterol: 67 mg/dL (ref 0–99)
NonHDL: 75.31
Total CHOL/HDL Ratio: 2
Triglycerides: 41 mg/dL (ref 0.0–149.0)
VLDL: 8.2 mg/dL (ref 0.0–40.0)

## 2020-02-25 LAB — COMPREHENSIVE METABOLIC PANEL
ALT: 21 U/L (ref 0–53)
AST: 29 U/L (ref 0–37)
Albumin: 4.3 g/dL (ref 3.5–5.2)
Alkaline Phosphatase: 76 U/L (ref 39–117)
BUN: 15 mg/dL (ref 6–23)
CO2: 29 mEq/L (ref 19–32)
Calcium: 9.6 mg/dL (ref 8.4–10.5)
Chloride: 97 mEq/L (ref 96–112)
Creatinine, Ser: 0.79 mg/dL (ref 0.40–1.50)
GFR: 93.62 mL/min (ref >60.00)
Glucose, Bld: 100 mg/dL — ABNORMAL HIGH (ref 70–99)
Potassium: 4.4 mEq/L (ref 3.5–5.1)
Sodium: 133 mEq/L — ABNORMAL LOW (ref 135–145)
Total Bilirubin: 0.6 mg/dL (ref 0.2–1.2)
Total Protein: 6.9 g/dL (ref 6.0–8.3)

## 2020-02-25 LAB — CBC
HCT: 42.3 % (ref 39.0–52.0)
Hemoglobin: 14.5 g/dL (ref 13.0–17.0)
MCHC: 34.3 g/dL (ref 30.0–36.0)
MCV: 92.4 fl (ref 78.0–100.0)
Platelets: 256 10*3/uL (ref 150.0–400.0)
RBC: 4.57 Mil/uL (ref 4.22–5.81)
RDW: 13.5 % (ref 11.5–15.5)
WBC: 5.7 10*3/uL (ref 4.0–10.5)

## 2020-02-25 LAB — T4, FREE: Free T4: 0.82 ng/dL (ref 0.60–1.60)

## 2020-02-25 NOTE — Progress Notes (Signed)
Subjective:    Patient ID: Mark Padilla, male    DOB: 12-08-1934, 85 y.o.   MRN: [id number]  HPI Here for Medicare wellness visit and follow up of chronic health conditions This visit occurred during the SARS-CoV-2 public health emergency.  Safety protocols were in place, including screening questions prior to the visit, additional usage of staff PPE, and extensive cleaning of exam room while observing appropriate contact time as indicated for disinfecting solutions.   Reviewed advanced directives Reviewed other doctors-- Mark Padilla--cardiology, Mark Padilla (and Mark Padilla once) No hospitalizations or surgery this year (other than basal cell carcinoma on left temple) No tobacco Has occasional drink Vision is okay Left ear hearing is off--right is okay. Might consider hearing aide Has had a couple of falls---minor injuries. Both times hooked his toe on something Does have FitBit--- walks a lot and monitors Independent with instrumental ADLs No memory problems other than names  He decided to stop the oxycodone Not happy about the narcotic dependence diagnosis Using meloxicam and tylenol Did have a few rough days  Tends to be hot tempered Some anxiety  Uses the xanax at night to help--really helps No depression--other than COVID related  No palpitations No chest pain or SOB No syncope. Will have brief dizziness if he gets up too fast No edema  Having some RUQ pain Relates to moving Padilla heavy TV--hurts in lower ribs  Current Outpatient Medications on File Prior to Visit  Medication Sig Dispense Refill  . albuterol (PROAIR HFA) 108 (90 Base) MCG/ACT inhaler Inhale 2 puffs into the lungs every 6 (six) hours as needed for wheezing or shortness of breath. 8 g 3  . ALPRAZolam (XANAX) 1 MG tablet TAKE 1 TABLET BY MOUTH TWO  TIMES DAILY AS NEEDED 180 tablet 0  . Ascorbic Acid (VITAMIN C) 500 MG tablet Take 500 mg by mouth daily.      Marland Kitchen atorvastatin (LIPITOR) 10 MG tablet  TAKE 1 TABLET BY MOUTH  DAILY 90 tablet 3  . azelastine (ASTELIN) 0.1 % nasal spray INSTILL 2 SPRAYS INTO BOTH  NOSTRILS AT BEDTIME. 60 mL 5  . [facility] SENNA PLUS 8.6-50 MG per tablet TAKE 2 TABLETS BY MOUTH TWICE A DAY 180 tablet 7  . fluticasone (FLONASE) 50 MCG/ACT nasal spray USE 2 SPRAYS IN EACH  NOSTRIL DAILY 48 g 3  . gabapentin (NEURONTIN) 300 MG capsule TAKE 1 CAPSULE BY MOUTH 3  TIMES DAILY 270 capsule 3  . hydrochlorothiazide (HYDRODIURIL) 25 MG tablet TAKE 1 TABLET BY MOUTH  DAILY 90 tablet 3  . loratadine (CLARITIN) 10 MG tablet Take 1 tablet (10 mg total) by mouth daily. 90 tablet 3  . meloxicam (MOBIC) 7.5 MG tablet TAKE 1 TABLET BY MOUTH  DAILY 90 tablet 3  . montelukast (SINGULAIR) 10 MG tablet TAKE 1 TABLET BY MOUTH AT  BEDTIME 90 tablet 3  . Multiple Vitamin (MULTIVITAMIN) capsule Take 1 capsule by mouth daily.      Marland Kitchen omeprazole (PRILOSEC) 40 MG capsule TAKE 1 CAPSULE BY MOUTH  DAILY 90 capsule 3  . Potassium 75 MG TABS Take by mouth daily.      . tamsulosin (FLOMAX) 0.4 MG CAPS capsule TAKE 1 CAPSULE BY MOUTH  DAILY 90 capsule 3  . warfarin (COUMADIN) 5 MG tablet TAKE AS DIRECTED BY  COUMADIN CLINIC 90 tablet 3   No current facility-administered medications on file prior to visit.    Allergies  Allergen Reactions  . Simvastatin  Listlessness, fatigue  . Mucinex D [Pseudoephedrine-Guaifenesin Er]   . Undecylenic Acid   . Flexeril [Cyclobenzaprine Hcl] Anxiety  . Skelaxin Anxiety    Past Medical History:  Diagnosis Date  . Allergic rhinitis   . Atrial fibrillation ([place])    chronic persistent, failed DCCV x3 in 2000  . BPH (benign prostatic hypertrophy)   . Cervical disc disease   . Diverticulitis   . ED (erectile dysfunction)   . GERD (gastroesophageal reflux disease)   . Hyperlipidemia   . Hypertension   . Impaired fasting glucose   . Lumbar disc disease   . Osteoarthritis   . Sleep disturbance     Past Surgical History:  Procedure Laterality Date  .  CATARACT EXTRACTION, BILATERAL  2014  . COLONOSCOPY  2013  . HEMIARTHROPLASTY SHOULDER FRACTURE  10/2004   left  . JOINT REPLACEMENT  4/12   Left hip--Mark Mark Padilla  . KNEE ARTHROSCOPY  06/1998   right Mark Padilla)  . LUMBAR LAMINECTOMY  06/10/13   [facility]  . REPLACEMENT UNICONDYLAR JOINT KNEE  8/12   Mark Mark Padilla  . SHOULDER SURGERY  1950's   dislocation  . SHOULDER SURGERY  12/2004   right  . THR--left  4/12   Mark Mark Padilla    Family History  Problem Relation Age of Onset  . Stomach cancer Mother   . Stroke Father   . Colon polyps Brother   . Colon cancer Neg Hx     Social History   Socioeconomic History  . Marital status: Divorced    Spouse name: Not on file  . Number of children: 2  . Years of education: Not on file  . Highest education level: Not on file  Occupational History  . Occupation: retired, [employer] works part time [employer]: RETIRED  Tobacco Use  . Smoking status: Former Smoker    Types: Cigarettes    Quit date: 11/21/1962    Years since quitting: 57.3  . Smokeless tobacco: Never Used  Substance and Sexual Activity  . Alcohol use: Yes    Alcohol/week: 0.0 standard drinks    Comment: occasional  . Drug use: No  . Sexual activity: Not on file  Other Topics Concern  . Not on file  Social History Narrative   Plans to do living will   Will designate daughter Mark Padilla as health care POA.    Would accept resuscitation but no prolonged artificial ventilation   Would not want tube feeds if cognitively unaware   Social Determinants of Health   Financial Resource Strain:   . Difficulty of Paying Living Expenses:   Food Insecurity:   . Worried About Charity fundraiser in the Last Year:   . Arboriculturist in the Last Year:   Transportation Needs:   . Film/video editor (Medical):   Marland Kitchen Lack of Transportation (Non-Medical):   Physical Activity:   . Days of Exercise per Week:   . Minutes of Exercise per Session:   Stress:   . Feeling  of Stress :   Social Connections:   . Frequency of Communication with Friends and Family:   . Frequency of Social Gatherings with Friends and Family:   . Attends Religious Services:   . Active Member of Clubs or Organizations:   . Attends Archivist Meetings:   Marland Kitchen Marital Status:   Intimate Partner Violence:   . Fear of Current or Ex-Partner:   . Emotionally Abused:   Marland Kitchen Physically  Abused:   . Sexually Abused:    Review of Systems Has dentures--no dentist for now Appetite is good Weight is down somewhat Sleeps okay with xanax Wears seat belt Occasional heartburn--- takes the omeprazole daily. No dysphagia Bowels are fine since off the oxycodone. No blood now (hemorrhoid in the past) Urine stream is okay--some urgency. Is slow but gets along okay    Objective:   Physical Exam  Constitutional: He is oriented to person, place, and time. He appears well-developed. No distress.  HENT:  Mouth/Throat: Oropharynx is clear and moist.  No oral lesions Full dentures  Neck: No thyromegaly present.  Cardiovascular: Normal rate, normal heart sounds and intact distal pulses. Exam reveals no gallop.  No murmur heard. irregular  Respiratory: Effort normal and breath sounds normal. No respiratory distress. He has no wheezes. He has no rales.  GI: Soft. There is no abdominal tenderness.  Musculoskeletal:        General: No tenderness or edema.  Lymphadenopathy:    He has no cervical adenopathy.  Neurological: He is alert and oriented to person, place, and time.  President--- "Zoila Shutter, Obama" [phone] [id number] Recall 3/3  Skin: No rash noted. No erythema.  Psychiatric: He has a normal mood and affect. His behavior is normal.           Assessment & Plan:

## 2020-02-25 NOTE — Assessment & Plan Note (Signed)
See social history 

## 2020-02-25 NOTE — Assessment & Plan Note (Signed)
Doing okay without the oxycodone

## 2020-02-25 NOTE — Assessment & Plan Note (Signed)
I have personally reviewed the Medicare Annual Wellness questionnaire and have noted 1. The patient's medical and social history 2. Their use of alcohol, tobacco or illicit drugs 3. Their current medications and supplements 4. The patient's functional ability including ADL's, fall risks, home safety risks and hearing or visual             impairment. 5. Diet and physical activities 6. Evidence for depression or mood disorders  The patients weight, height, BMI and visual acuity have been recorded in the chart I have made referrals, counseling and provided education to the patient based review of the above and I have provided the pt with a written personalized care plan for preventive services.  I have provided you with a copy of your personalized plan for preventive services. Please take the time to review along with your updated medication list.  Done with cancer screening Stays active Flu vaccine in the fall

## 2020-02-25 NOTE — Assessment & Plan Note (Signed)
Rate is controlled On the coumadin

## 2020-02-25 NOTE — Assessment & Plan Note (Signed)
Acceptable symptoms on the flomax

## 2020-02-25 NOTE — Assessment & Plan Note (Signed)
BP Readings from Last 3 Encounters:  02/25/20 138/76  11/26/19 116/80  08/27/19 112/76   Good control Due for labs

## 2020-02-25 NOTE — Progress Notes (Signed)
Hearing Screening   Method: Audiometry   125Hz  250Hz  500Hz  1000Hz  2000Hz  3000Hz  4000Hz  6000Hz  8000Hz   Right ear:   20 25 20   0    Left ear:   20 0 20  40    Vision Screening Comments: October 2020

## 2020-02-25 NOTE — Assessment & Plan Note (Signed)
Mild anxiety Xanax for sleep

## 2020-03-12 ENCOUNTER — Other Ambulatory Visit: Payer: Self-pay

## 2020-03-12 ENCOUNTER — Ambulatory Visit (INDEPENDENT_AMBULATORY_CARE_PROVIDER_SITE_OTHER): Payer: Medicare Other

## 2020-03-12 DIAGNOSIS — Z7901 Long term (current) use of anticoagulants: Secondary | ICD-10-CM

## 2020-03-12 LAB — POCT INR: INR: 1.9 — AB (ref 2.0–3.0)

## 2020-03-12 NOTE — Patient Instructions (Addendum)
Pre visit review using our clinic review tool, if applicable. No additional management support is needed unless otherwise documented below in the visit note.  Take 7.5mg  today then continue taking 1 tablet daily except 1 1/2 tablets on Sundays.  Re-check in to 4 weeks.

## 2020-03-16 ENCOUNTER — Ambulatory Visit (INDEPENDENT_AMBULATORY_CARE_PROVIDER_SITE_OTHER)
Admission: RE | Admit: 2020-03-16 | Discharge: 2020-03-16 | Disposition: A | Discharge status: Home / Self Care | Payer: Medicare Other | Source: Ambulatory Visit | Attending: Internal Medicine | Admitting: Internal Medicine

## 2020-03-16 ENCOUNTER — Encounter: Payer: Self-pay | Admitting: Internal Medicine

## 2020-03-16 ENCOUNTER — Ambulatory Visit (INDEPENDENT_AMBULATORY_CARE_PROVIDER_SITE_OTHER): Payer: Medicare Other | Admitting: Internal Medicine

## 2020-03-16 ENCOUNTER — Other Ambulatory Visit: Payer: Self-pay

## 2020-03-16 VITALS — BP 118/70 | HR 48 | Temp 98.0°F | Ht 65.5 in | Wt 181.0 lb

## 2020-03-16 DIAGNOSIS — R042 Hemoptysis: Secondary | ICD-10-CM

## 2020-03-16 DIAGNOSIS — R1011 Right upper quadrant pain: Secondary | ICD-10-CM | POA: Diagnosis not present

## 2020-03-16 NOTE — Progress Notes (Signed)
Subjective:    Patient ID: Mark Padilla, male    DOB: 1935/02/28, 85 y.o.   MRN: [id number]  HPI Here due to ongoing abdominal trouble This visit occurred during the SARS-CoV-2 public health emergency.  Safety protocols were in place, including screening questions prior to the visit, additional usage of staff PPE, and extensive cleaning of exam room while observing appropriate contact time as indicated for disinfecting solutions.   He has ongoing RUQ abdominal pain Tender when he pushes there---or if he turns a certain way He has coughed up some blood over the past few days 2 small spots in phlegm (spit into the commode) This has only been in the morning None during the day  Has changed the omeprazole to nighttime---seemed to help the burping for a week--now back again Has had some regurgitation but no hematemesis  Current Outpatient Medications on File Prior to Visit  Medication Sig Dispense Refill  . ALPRAZolam (XANAX) 1 MG tablet TAKE 1 TABLET BY MOUTH TWO  TIMES DAILY AS NEEDED 180 tablet 0  . Ascorbic Acid (VITAMIN C) 500 MG tablet Take 500 mg by mouth daily.      Marland Kitchen atorvastatin (LIPITOR) 10 MG tablet TAKE 1 TABLET BY MOUTH  DAILY 90 tablet 3  . azelastine (ASTELIN) 0.1 % nasal spray INSTILL 2 SPRAYS INTO BOTH  NOSTRILS AT BEDTIME. 60 mL 5  . [facility] SENNA PLUS 8.6-50 MG per tablet TAKE 2 TABLETS BY MOUTH TWICE A DAY 180 tablet 7  . fluticasone (FLONASE) 50 MCG/ACT nasal spray USE 2 SPRAYS IN EACH  NOSTRIL DAILY 48 g 3  . gabapentin (NEURONTIN) 300 MG capsule TAKE 1 CAPSULE BY MOUTH 3  TIMES DAILY 270 capsule 3  . hydrochlorothiazide (HYDRODIURIL) 25 MG tablet TAKE 1 TABLET BY MOUTH  DAILY 90 tablet 3  . loratadine (CLARITIN) 10 MG tablet Take 1 tablet (10 mg total) by mouth daily. 90 tablet 3  . meloxicam (MOBIC) 7.5 MG tablet TAKE 1 TABLET BY MOUTH  DAILY 90 tablet 3  . montelukast (SINGULAIR) 10 MG tablet TAKE 1 TABLET BY MOUTH AT  BEDTIME 90 tablet 3  . Multiple Vitamin  (MULTIVITAMIN) capsule Take 1 capsule by mouth daily.      Marland Kitchen omeprazole (PRILOSEC) 40 MG capsule TAKE 1 CAPSULE BY MOUTH  DAILY 90 capsule 3  . Potassium 75 MG TABS Take by mouth daily.      . tamsulosin (FLOMAX) 0.4 MG CAPS capsule TAKE 1 CAPSULE BY MOUTH  DAILY 90 capsule 3  . warfarin (COUMADIN) 5 MG tablet TAKE AS DIRECTED BY  COUMADIN CLINIC 90 tablet 3   No current facility-administered medications on file prior to visit.    Allergies  Allergen Reactions  . Simvastatin     Listlessness, fatigue  . Mucinex D [Pseudoephedrine-Guaifenesin Er]   . Undecylenic Acid   . Flexeril [Cyclobenzaprine Hcl] Anxiety  . Skelaxin Anxiety    Past Medical History:  Diagnosis Date  . Allergic rhinitis   . Atrial fibrillation ([place])    chronic persistent, failed DCCV x3 in 2000  . BPH (benign prostatic hypertrophy)   . Cervical disc disease   . Diverticulitis   . ED (erectile dysfunction)   . GERD (gastroesophageal reflux disease)   . Hyperlipidemia   . Hypertension   . Impaired fasting glucose   . Lumbar disc disease   . Osteoarthritis   . Sleep disturbance     Past Surgical History:  Procedure Laterality Date  . CATARACT EXTRACTION,  BILATERAL  2014  . COLONOSCOPY  2013  . HEMIARTHROPLASTY SHOULDER FRACTURE  10/2004   left  . JOINT REPLACEMENT  4/12   Left hip--Dr Jefm Sandin  . KNEE ARTHROSCOPY  06/1998   right Jefm Lyall)  . LUMBAR LAMINECTOMY  06/10/13   [facility]  . REPLACEMENT UNICONDYLAR JOINT KNEE  8/12   Dr Jefm Frazer  . SHOULDER SURGERY  1950's   dislocation  . SHOULDER SURGERY  12/2004   right  . THR--left  4/12   Dr Jefm Hashemi    Family History  Problem Relation Age of Onset  . Stomach cancer Mother   . Stroke Father   . Colon polyps Brother   . Colon cancer Neg Hx     Social History   Socioeconomic History  . Marital status: Divorced    Spouse name: Not on file  . Number of children: 2  . Years of education: Not on file  . Highest education level: Not on file   Occupational History  . Occupation: retired, [employer] works part time [employer]: RETIRED  Tobacco Use  . Smoking status: Former Smoker    Types: Cigarettes    Quit date: 11/21/1962    Years since quitting: 57.3  . Smokeless tobacco: Never Used  Substance and Sexual Activity  . Alcohol use: Yes    Alcohol/week: 0.0 standard drinks    Comment: occasional  . Drug use: No  . Sexual activity: Not on file  Other Topics Concern  . Not on file  Social History Narrative   Plans to do living will   Will designate daughter [name] as health care POA.    Would accept resuscitation but no prolonged artificial ventilation   Would not want tube feeds if cognitively unaware   Social Determinants of Health   Financial Resource Strain:   . Difficulty of Paying Living Expenses:   Food Insecurity:   . Worried About Charity fundraiser in the Last Year:   . Arboriculturist in the Last Year:   Transportation Needs:   . Film/video editor (Medical):   Marland Kitchen Lack of Transportation (Non-Medical):   Physical Activity:   . Days of Exercise per Week:   . Minutes of Exercise per Session:   Stress:   . Feeling of Stress :   Social Connections:   . Frequency of Communication with Friends and Family:   . Frequency of Social Gatherings with Friends and Family:   . Attends Religious Services:   . Active Member of Clubs or Organizations:   . Attends Archivist Meetings:   Marland Kitchen Marital Status:   Intimate Partner Violence:   . Fear of Current or Ex-Partner:   . Emotionally Abused:   Marland Kitchen Physically Abused:   . Sexually Abused:    Review of Systems No N/V Appetite is fine No fever No regular cough--just AM phlegm    Objective:   Physical Exam  Constitutional: He appears well-developed. No distress.  Neck: No thyromegaly present.  Cardiovascular: Normal rate. Exam reveals no gallop.  No murmur heard. Slightly irregular  Respiratory: Effort normal. No respiratory  distress. He has no wheezes. He has no rales.  Decreased breath sounds but clear  GI: Soft. Bowel sounds are normal. He exhibits no distension. There is no rebound and no guarding.  Very slightly positive Murphy's sign  Musculoskeletal:        General: No edema.  Lymphadenopathy:    He has no cervical adenopathy.  Assessment & Plan:

## 2020-03-16 NOTE — Assessment & Plan Note (Signed)
Mildly positive Murphy's sign--otherwise, not really consistent with cholecystitis Has ongoing burping and epigastric symptoms as well Not sure if it could be related to meloxicam Is on PPI Will set up with GI for further evaluation

## 2020-03-16 NOTE — Assessment & Plan Note (Signed)
As inflammation in nose and small polyp on left. He has noted nosebleed in past--but never coughed it up Discussed that I think the blood is from his nose Will check CXR just to be sure If recurs, next step would be reevaluation by Dr Pryor Ochoa

## 2020-04-04 ENCOUNTER — Telehealth: Payer: Self-pay

## 2020-04-09 ENCOUNTER — Other Ambulatory Visit: Payer: Self-pay

## 2020-04-09 ENCOUNTER — Ambulatory Visit (INDEPENDENT_AMBULATORY_CARE_PROVIDER_SITE_OTHER): Payer: Medicare Other

## 2020-04-09 DIAGNOSIS — Z7901 Long term (current) use of anticoagulants: Secondary | ICD-10-CM | POA: Diagnosis not present

## 2020-04-09 LAB — POCT INR: INR: 3.4 — AB (ref 2.0–3.0)

## 2020-04-09 NOTE — Telephone Encounter (Signed)
Pt needed a face to face encounter appointment. That has been made.

## 2020-04-09 NOTE — Patient Instructions (Addendum)
Pre visit review using our clinic review tool, if applicable. No additional management support is needed unless otherwise documented below in the visit note.  Hold dose today then continue taking 1 tablet daily except 1 1/2 tablets on Sundays.  Re-check in to 4 weeks.

## 2020-04-13 ENCOUNTER — Other Ambulatory Visit: Payer: Self-pay

## 2020-04-13 ENCOUNTER — Encounter: Payer: Self-pay | Admitting: Internal Medicine

## 2020-04-13 ENCOUNTER — Ambulatory Visit (INDEPENDENT_AMBULATORY_CARE_PROVIDER_SITE_OTHER): Payer: Medicare Other | Admitting: Internal Medicine

## 2020-04-13 DIAGNOSIS — M8949 Other hypertrophic osteoarthropathy, multiple sites: Secondary | ICD-10-CM

## 2020-04-13 DIAGNOSIS — M159 Polyosteoarthritis, unspecified: Secondary | ICD-10-CM

## 2020-04-13 NOTE — Progress Notes (Signed)
Subjective:    Patient ID: Mark Padilla, male    DOB: 28-Jan-1935, 85 y.o.   MRN: [id number]  HPI Here for face to face visit for lift chair This visit occurred during the SARS-CoV-2 public health emergency.  Safety protocols were in place, including screening questions prior to the visit, additional usage of staff PPE, and extensive cleaning of exam room while observing appropriate contact time as indicated for disinfecting solutions.   Daughter feels he needs a lift chair Will have trouble getting out of chairs in his house Current recliner he couldn't get out of  Has cracking and pain in his knees This limits his mobility  Current Outpatient Medications on File Prior to Visit  Medication Sig Dispense Refill  . ALPRAZolam (XANAX) 1 MG tablet TAKE 1 TABLET BY MOUTH TWO  TIMES DAILY AS NEEDED 180 tablet 0  . Ascorbic Acid (VITAMIN C) 500 MG tablet Take 500 mg by mouth daily.      Marland Kitchen atorvastatin (LIPITOR) 10 MG tablet TAKE 1 TABLET BY MOUTH  DAILY 90 tablet 3  . azelastine (ASTELIN) 0.1 % nasal spray INSTILL 2 SPRAYS INTO BOTH  NOSTRILS AT BEDTIME. 60 mL 5  . fluticasone (FLONASE) 50 MCG/ACT nasal spray USE 2 SPRAYS IN EACH  NOSTRIL DAILY 48 g 3  . gabapentin (NEURONTIN) 300 MG capsule TAKE 1 CAPSULE BY MOUTH 3  TIMES DAILY 270 capsule 3  . hydrochlorothiazide (HYDRODIURIL) 25 MG tablet TAKE 1 TABLET BY MOUTH  DAILY 90 tablet 3  . loratadine (CLARITIN) 10 MG tablet Take 1 tablet (10 mg total) by mouth daily. 90 tablet 3  . meloxicam (MOBIC) 7.5 MG tablet TAKE 1 TABLET BY MOUTH  DAILY 90 tablet 3  . montelukast (SINGULAIR) 10 MG tablet TAKE 1 TABLET BY MOUTH AT  BEDTIME 90 tablet 3  . Multiple Vitamin (MULTIVITAMIN) capsule Take 1 capsule by mouth daily.      Marland Kitchen omeprazole (PRILOSEC) 40 MG capsule TAKE 1 CAPSULE BY MOUTH  DAILY 90 capsule 3  . Potassium 75 MG TABS Take by mouth daily.      . tamsulosin (FLOMAX) 0.4 MG CAPS capsule TAKE 1 CAPSULE BY MOUTH  DAILY 90 capsule 3  .  warfarin (COUMADIN) 5 MG tablet TAKE AS DIRECTED BY  COUMADIN CLINIC 90 tablet 3   No current facility-administered medications on file prior to visit.    Allergies  Allergen Reactions  . Simvastatin     Listlessness, fatigue  . Mucinex D [Pseudoephedrine-Guaifenesin Er]   . Undecylenic Acid   . Flexeril [Cyclobenzaprine Hcl] Anxiety  . Skelaxin Anxiety    Past Medical History:  Diagnosis Date  . Allergic rhinitis   . Atrial fibrillation ([place])    chronic persistent, failed DCCV x3 in 2000  . BPH (benign prostatic hypertrophy)   . Cervical disc disease   . Diverticulitis   . ED (erectile dysfunction)   . GERD (gastroesophageal reflux disease)   . Hyperlipidemia   . Hypertension   . Impaired fasting glucose   . Lumbar disc disease   . Osteoarthritis   . Sleep disturbance     Past Surgical History:  Procedure Laterality Date  . CATARACT EXTRACTION, BILATERAL  2014  . COLONOSCOPY  2013  . HEMIARTHROPLASTY SHOULDER FRACTURE  10/2004   left  . JOINT REPLACEMENT  4/12   Left hip--Dr Jefm Haith  . KNEE ARTHROSCOPY  06/1998   right Jefm Avino)  . LUMBAR LAMINECTOMY  06/10/13   [facility]  . REPLACEMENT UNICONDYLAR  JOINT KNEE  8/12   Dr Jefm Yarde  . SHOULDER SURGERY  1950's   dislocation  . SHOULDER SURGERY  12/2004   right  . THR--left  4/12   Dr Jefm Gledhill    Family History  Problem Relation Age of Onset  . Stomach cancer Mother   . Stroke Father   . Colon polyps Brother   . Colon cancer Neg Hx     Social History   Socioeconomic History  . Marital status: Divorced    Spouse name: Not on file  . Number of children: 2  . Years of education: Not on file  . Highest education level: Not on file  Occupational History  . Occupation: retired, [employer] works part time [employer]: RETIRED  Tobacco Use  . Smoking status: Former Smoker    Types: Cigarettes    Quit date: 11/21/1962    Years since quitting: 57.4  . Smokeless tobacco: Never Used    Substance and Sexual Activity  . Alcohol use: Yes    Alcohol/week: 0.0 standard drinks    Comment: occasional  . Drug use: No  . Sexual activity: Not on file  Other Topics Concern  . Not on file  Social History Narrative   Plans to do living will   Will designate daughter [name] as health care POA.    Would accept resuscitation but no prolonged artificial ventilation   Would not want tube feeds if cognitively unaware   Social Determinants of Health   Financial Resource Strain:   . Difficulty of Paying Living Expenses:   Food Insecurity:   . Worried About Charity fundraiser in the Last Year:   . Arboriculturist in the Last Year:   Transportation Needs:   . Film/video editor (Medical):   Marland Kitchen Lack of Transportation (Non-Medical):   Physical Activity:   . Days of Exercise per Week:   . Minutes of Exercise per Session:   Stress:   . Feeling of Stress :   Social Connections:   . Frequency of Communication with Friends and Family:   . Frequency of Social Gatherings with Friends and Family:   . Attends Religious Services:   . Active Member of Clubs or Organizations:   . Attends Archivist Meetings:   Marland Kitchen Marital Status:   Intimate Partner Violence:   . Fear of Current or Ex-Partner:   . Emotionally Abused:   Marland Kitchen Physically Abused:   . Sexually Abused:    Review of Systems     Objective:   Physical Exam  Constitutional: No distress.  Neurological:  Is able to get out of the chair here--with arm support           Assessment & Plan:

## 2020-04-13 NOTE — Assessment & Plan Note (Signed)
Significant knee arthritis--and couldn't get out of his past recliner Now has lift chair--and that helps him Discussed--he doesn't fit criteria for Medicare to cover this

## 2020-04-21 ENCOUNTER — Encounter: Payer: Self-pay | Admitting: Gastroenterology

## 2020-04-21 ENCOUNTER — Other Ambulatory Visit: Payer: Self-pay

## 2020-04-21 ENCOUNTER — Ambulatory Visit: Payer: Medicare Other | Admitting: Gastroenterology

## 2020-04-21 VITALS — BP 151/78 | HR 92 | Temp 98.0°F | Ht 65.5 in | Wt 180.8 lb

## 2020-04-21 DIAGNOSIS — R1011 Right upper quadrant pain: Secondary | ICD-10-CM | POA: Diagnosis not present

## 2020-04-21 DIAGNOSIS — R197 Diarrhea, unspecified: Secondary | ICD-10-CM

## 2020-04-21 MED ORDER — SUPREP BOWEL PREP KIT 17.5-3.13-1.6 GM/177ML PO SOLN: 1.0000 | ORAL | 0 refills | Status: DC

## 2020-04-21 NOTE — H&P (View-Only) (Signed)
Gastroenterology Consultation  Referring Provider:     Venia Carbon, MD Primary Care Physician:  Venia Carbon, MD Primary Gastroenterologist:  Dr. Allen Norris     Reason for Consultation:     Right upper quadrant abdominal pain        HPI:   Mark Padilla is a 85 y.o. y/o male referred for consultation & management of right upper quadrant abdominal pain by Dr. Silvio Pate, Theophilus Kinds, MD.  This patient comes in today after being seen on 4/26 of this year by his primary care provider for right upper quadrant pain.  At that time the primary care provider felt that the patient may have possible positive Murphy sign but no other symptoms of cholecystitis.  The patient was also reported to have persistent epigastric discomfort and there was question whether meloxicam may be contributing to this patient's symptoms.  The patient denies having the right upper quadrant pain at the present time.  He reports that the symptoms have been going on for 6 months.  He is not sure but he thinks may be fatty foods and greasy foods make the symptoms worse.  There is no report of any unexplained weight loss nausea or vomiting.  The patient's right upper quadrant pain is made worse when he twists or turns.  He denies it being any worse when he bends over.  The patient does have some bright red blood per rectum when he wipes.  He states that is only when he is constipated.  The patient has been having chronic constipation while he was on opioid medication for his arthritis for many years.  The patient was then stopped from taking the opioids and had diarrhea for some time but now reports that his diarrhea is intermittent but has not resolved.  He gave an example of going to see a friend of his while he was on vacation which resulted in him visiting another person and having to stop 3 times during that half hour trip.  Past Medical History:  Diagnosis Date  . Allergic rhinitis   . Atrial fibrillation ([place])    chronic persistent, failed DCCV x3 in 2000  . BPH (benign prostatic hypertrophy)   . Cervical disc disease   . Diverticulitis   . ED (erectile dysfunction)   . GERD (gastroesophageal reflux disease)   . Hyperlipidemia   . Hypertension   . Impaired fasting glucose   . Lumbar disc disease   . Osteoarthritis   . Sleep disturbance     Past Surgical History:  Procedure Laterality Date  . CATARACT EXTRACTION, BILATERAL  2014  . COLONOSCOPY  2013  . HEMIARTHROPLASTY SHOULDER FRACTURE  10/2004   left  . JOINT REPLACEMENT  4/12   Left hip--Dr Jefm Kam  . KNEE ARTHROSCOPY  06/1998   right Jefm Backhaus)  . LUMBAR LAMINECTOMY  06/10/13   [facility]  . REPLACEMENT UNICONDYLAR JOINT KNEE  8/12   Dr Jefm Brewton  . SHOULDER SURGERY  1950's   dislocation  . SHOULDER SURGERY  12/2004   right  . THR--left  4/12   Dr Jefm Rostro    Prior to Admission medications   Medication Sig Start Date End Date Taking? Authorizing Provider  ALPRAZolam Duanne Moron) 1 MG tablet TAKE 1 TABLET BY MOUTH TWO  TIMES DAILY AS NEEDED 10/15/19   Venia Carbon, MD  Ascorbic Acid (VITAMIN C) 500 MG tablet Take 500 mg by mouth daily.      [provider]  atorvastatin (LIPITOR) 10  MG tablet TAKE 1 TABLET BY MOUTH  DAILY 08/12/19   Viviana Simpler I, MD  azelastine (ASTELIN) 0.1 % nasal spray INSTILL 2 SPRAYS INTO BOTH  NOSTRILS AT BEDTIME. 03/20/19   Venia Carbon, MD  fluticasone [facility]) 50 MCG/ACT nasal spray USE 2 SPRAYS IN EACH  NOSTRIL DAILY 09/05/19   Venia Carbon, MD  gabapentin (NEURONTIN) 300 MG capsule TAKE 1 CAPSULE BY MOUTH 3  TIMES DAILY 08/12/19   Viviana Simpler I, MD  hydrochlorothiazide (HYDRODIURIL) 25 MG tablet TAKE 1 TABLET BY MOUTH  DAILY 02/18/20   Venia Carbon, MD  loratadine (CLARITIN) 10 MG tablet Take 1 tablet (10 mg total) by mouth daily. 01/07/16   Venia Carbon, MD  meloxicam (MOBIC) 7.5 MG tablet TAKE 1 TABLET BY MOUTH  DAILY 02/18/20   Viviana Simpler I, MD  montelukast  (SINGULAIR) 10 MG tablet TAKE 1 TABLET BY MOUTH AT  BEDTIME 02/18/20   Venia Carbon, MD  Multiple Vitamin (MULTIVITAMIN) capsule Take 1 capsule by mouth daily.      [provider]  omeprazole (PRILOSEC) 40 MG capsule TAKE 1 CAPSULE BY MOUTH  DAILY 02/18/20   Viviana Simpler I, MD  Potassium 75 MG TABS Take by mouth daily.      [provider]  tamsulosin (FLOMAX) 0.4 MG CAPS capsule TAKE 1 CAPSULE BY MOUTH  DAILY 02/18/20   Viviana Simpler I, MD  warfarin (COUMADIN) 5 MG tablet TAKE AS DIRECTED BY  COUMADIN CLINIC 03/25/19   Venia Carbon, MD    Family History  Problem Relation Age of Onset  . Stomach cancer Mother   . Stroke Father   . Colon polyps Brother   . Colon cancer Neg Hx      Social History   Tobacco Use  . Smoking status: Former Smoker    Types: Cigarettes    Quit date: 11/21/1962    Years since quitting: 57.4  . Smokeless tobacco: Never Used  Substance Use Topics  . Alcohol use: Yes    Alcohol/week: 0.0 standard drinks    Comment: occasional  . Drug use: No    Allergies as of 04/21/2020 - Review Complete 04/13/2020  Allergen Reaction Noted  . Simvastatin  05/07/2012  . Mucinex d [pseudoephedrine-guaifenesin er]  11/08/2013  . Undecylenic acid  11/08/2013  . Flexeril [cyclobenzaprine hcl] Anxiety 02/18/2011  . Skelaxin Anxiety 02/18/2011    Review of Systems:    All systems reviewed and negative except where noted in HPI.   Physical Exam:  There were no vitals taken for this visit. No LMP for male patient. General:   Alert,  Well-developed, well-nourished, pleasant and cooperative in NAD Head:  Normocephalic and atraumatic. Eyes:  Sclera clear, no icterus.   Conjunctiva pink. Ears:  Normal auditory acuity. Neck:  Supple; no masses or thyromegaly. Lungs:  Respirations even and unlabored.  Clear throughout to auscultation.   No wheezes, crackles, or rhonchi. No acute distress. Heart:  Regular rate and rhythm; no murmurs, clicks, rubs,  or gallops. Abdomen:  Normal bowel sounds.  No bruits.  Soft, non-tender and non-distended without masses, hepatosplenomegaly or hernias noted.  No guarding or rebound tenderness.  Negative Carnett sign.   Rectal:  Deferred.  Pulses:  Normal pulses noted. Extremities:  No clubbing or edema.  No cyanosis. Neurologic:  Alert and oriented x3;  grossly normal neurologically. Skin:  Intact without significant lesions or rashes.  No jaundice. Lymph Nodes:  No significant cervical adenopathy. Psych:  Alert and cooperative. Normal mood and affect.  Imaging Studies: No results found.  Assessment and Plan:   Mark Padilla is a 85 y.o. y/o male who comes in today with a change in bowel habits being more frequent diarrhea.  The patient has no reproducible right upper quadrant pain at the present time but since he reports it to be associated with fatty and greasy foods the patient will be set up for a right upper quadrant ultrasound with a gallbladder emptying study.  The patient will also be set up for a colonoscopy to rule out microscopic colitis since he is now having frequent bouts of diarrhea.  The patient has been explained the plan and agrees with it.    Lucilla Lame, MD. Marval Regal    Note: This dictation was prepared with Dragon dictation along with smaller phrase technology. Any transcriptional errors that result from this process are unintentional.

## 2020-04-21 NOTE — Progress Notes (Signed)
Gastroenterology Consultation  Referring Provider:     Venia Carbon, MD Primary Care Physician:  Venia Carbon, MD Primary Gastroenterologist:  Dr. Allen Norris     Reason for Consultation:     Right upper quadrant abdominal pain        HPI:   Mark Padilla is a 85 y.o. y/o male referred for consultation & management of right upper quadrant abdominal pain by Dr. Silvio Pate, Theophilus Kinds, MD.  This patient comes in today after being seen on 4/26 of this year by his primary care provider for right upper quadrant pain.  At that time the primary care provider felt that the patient may have possible positive Murphy sign but no other symptoms of cholecystitis.  The patient was also reported to have persistent epigastric discomfort and there was question whether meloxicam may be contributing to this patient's symptoms.  The patient denies having the right upper quadrant pain at the present time.  He reports that the symptoms have been going on for 6 months.  He is not sure but he thinks may be fatty foods and greasy foods make the symptoms worse.  There is no report of any unexplained weight loss nausea or vomiting.  The patient's right upper quadrant pain is made worse when he twists or turns.  He denies it being any worse when he bends over.  The patient does have some bright red blood per rectum when he wipes.  He states that is only when he is constipated.  The patient has been having chronic constipation while he was on opioid medication for his arthritis for many years.  The patient was then stopped from taking the opioids and had diarrhea for some time but now reports that his diarrhea is intermittent but has not resolved.  He gave an example of going to see a friend of his while he was on vacation which resulted in him visiting another person and having to stop 3 times during that half hour trip.  Past Medical History:  Diagnosis Date  . Allergic rhinitis   . Atrial fibrillation ([place])    chronic persistent, failed DCCV x3 in 2000  . BPH (benign prostatic hypertrophy)   . Cervical disc disease   . Diverticulitis   . ED (erectile dysfunction)   . GERD (gastroesophageal reflux disease)   . Hyperlipidemia   . Hypertension   . Impaired fasting glucose   . Lumbar disc disease   . Osteoarthritis   . Sleep disturbance     Past Surgical History:  Procedure Laterality Date  . CATARACT EXTRACTION, BILATERAL  2014  . COLONOSCOPY  2013  . HEMIARTHROPLASTY SHOULDER FRACTURE  10/2004   left  . JOINT REPLACEMENT  4/12   Left hip--Dr Jefm Belk  . KNEE ARTHROSCOPY  06/1998   right Jefm Hinton)  . LUMBAR LAMINECTOMY  06/10/13   [facility]  . REPLACEMENT UNICONDYLAR JOINT KNEE  8/12   Dr Jefm Wojnarowski  . SHOULDER SURGERY  1950's   dislocation  . SHOULDER SURGERY  12/2004   right  . THR--left  4/12   Dr Jefm Suhre    Prior to Admission medications   Medication Sig Start Date End Date Taking? Authorizing Provider  ALPRAZolam Duanne Moron) 1 MG tablet TAKE 1 TABLET BY MOUTH TWO  TIMES DAILY AS NEEDED 10/15/19   Venia Carbon, MD  Ascorbic Acid (VITAMIN C) 500 MG tablet Take 500 mg by mouth daily.      [provider]  atorvastatin (LIPITOR) 10  MG tablet TAKE 1 TABLET BY MOUTH  DAILY 08/12/19   Viviana Simpler I, MD  azelastine (ASTELIN) 0.1 % nasal spray INSTILL 2 SPRAYS INTO BOTH  NOSTRILS AT BEDTIME. 03/20/19   Venia Carbon, MD  fluticasone [facility]) 50 MCG/ACT nasal spray USE 2 SPRAYS IN EACH  NOSTRIL DAILY 09/05/19   Venia Carbon, MD  gabapentin (NEURONTIN) 300 MG capsule TAKE 1 CAPSULE BY MOUTH 3  TIMES DAILY 08/12/19   Viviana Simpler I, MD  hydrochlorothiazide (HYDRODIURIL) 25 MG tablet TAKE 1 TABLET BY MOUTH  DAILY 02/18/20   Venia Carbon, MD  loratadine (CLARITIN) 10 MG tablet Take 1 tablet (10 mg total) by mouth daily. 01/07/16   Venia Carbon, MD  meloxicam (MOBIC) 7.5 MG tablet TAKE 1 TABLET BY MOUTH  DAILY 02/18/20   Viviana Simpler I, MD  montelukast  (SINGULAIR) 10 MG tablet TAKE 1 TABLET BY MOUTH AT  BEDTIME 02/18/20   Venia Carbon, MD  Multiple Vitamin (MULTIVITAMIN) capsule Take 1 capsule by mouth daily.      [provider]  omeprazole (PRILOSEC) 40 MG capsule TAKE 1 CAPSULE BY MOUTH  DAILY 02/18/20   Viviana Simpler I, MD  Potassium 75 MG TABS Take by mouth daily.      [provider]  tamsulosin (FLOMAX) 0.4 MG CAPS capsule TAKE 1 CAPSULE BY MOUTH  DAILY 02/18/20   Viviana Simpler I, MD  warfarin (COUMADIN) 5 MG tablet TAKE AS DIRECTED BY  COUMADIN CLINIC 03/25/19   Venia Carbon, MD    Family History  Problem Relation Age of Onset  . Stomach cancer Mother   . Stroke Father   . Colon polyps Brother   . Colon cancer Neg Hx      Social History   Tobacco Use  . Smoking status: Former Smoker    Types: Cigarettes    Quit date: 11/21/1962    Years since quitting: 57.4  . Smokeless tobacco: Never Used  Substance Use Topics  . Alcohol use: Yes    Alcohol/week: 0.0 standard drinks    Comment: occasional  . Drug use: No    Allergies as of 04/21/2020 - Review Complete 04/13/2020  Allergen Reaction Noted  . Simvastatin  05/07/2012  . Mucinex d [pseudoephedrine-guaifenesin er]  11/08/2013  . Undecylenic acid  11/08/2013  . Flexeril [cyclobenzaprine hcl] Anxiety 02/18/2011  . Skelaxin Anxiety 02/18/2011    Review of Systems:    All systems reviewed and negative except where noted in HPI.   Physical Exam:  There were no vitals taken for this visit. No LMP for male patient. General:   Alert,  Well-developed, well-nourished, pleasant and cooperative in NAD Head:  Normocephalic and atraumatic. Eyes:  Sclera clear, no icterus.   Conjunctiva pink. Ears:  Normal auditory acuity. Neck:  Supple; no masses or thyromegaly. Lungs:  Respirations even and unlabored.  Clear throughout to auscultation.   No wheezes, crackles, or rhonchi. No acute distress. Heart:  Regular rate and rhythm; no murmurs, clicks, rubs,  or gallops. Abdomen:  Normal bowel sounds.  No bruits.  Soft, non-tender and non-distended without masses, hepatosplenomegaly or hernias noted.  No guarding or rebound tenderness.  Negative Carnett sign.   Rectal:  Deferred.  Pulses:  Normal pulses noted. Extremities:  No clubbing or edema.  No cyanosis. Neurologic:  Alert and oriented x3;  grossly normal neurologically. Skin:  Intact without significant lesions or rashes.  No jaundice. Lymph Nodes:  No significant cervical adenopathy. Psych:  Alert and cooperative. Normal mood and affect.  Imaging Studies: No results found.  Assessment and Plan:   DAMEER LAMON is a 85 y.o. y/o male who comes in today with a change in bowel habits being more frequent diarrhea.  The patient has no reproducible right upper quadrant pain at the present time but since he reports it to be associated with fatty and greasy foods the patient will be set up for a right upper quadrant ultrasound with a gallbladder emptying study.  The patient will also be set up for a colonoscopy to rule out microscopic colitis since he is now having frequent bouts of diarrhea.  The patient has been explained the plan and agrees with it.    Lucilla Lame, MD. Marval Regal    Note: This dictation was prepared with Dragon dictation along with smaller phrase technology. Any transcriptional errors that result from this process are unintentional.

## 2020-04-24 ENCOUNTER — Other Ambulatory Visit: Payer: Self-pay

## 2020-04-24 ENCOUNTER — Other Ambulatory Visit
Admission: RE | Admit: 2020-04-24 | Discharge: 2020-04-24 | Disposition: A | Discharge status: Home / Self Care | Payer: Medicare Other | Source: Ambulatory Visit | Attending: Gastroenterology | Admitting: Gastroenterology

## 2020-04-24 DIAGNOSIS — Z20822 Contact with and (suspected) exposure to covid-19: Secondary | ICD-10-CM | POA: Diagnosis not present

## 2020-04-24 DIAGNOSIS — Z01812 Encounter for preprocedural laboratory examination: Secondary | ICD-10-CM | POA: Diagnosis not present

## 2020-04-24 MED ORDER — ALPRAZOLAM 1 MG PO TABS: ORAL_TABLET | ORAL | 0 refills | Status: DC

## 2020-04-24 NOTE — Telephone Encounter (Signed)
Name of Medication:Xanax 1 mg Name of Pharmacy:[facility] or Written Date and Quantity:10-15-19#180 Last Office Visit and Type:04-13-2020--acute Next Office Visit and Type:02-26-2021 Last Controlled Substance Agreement Date:02/05/19 Last UDS:01/29/19   Send to [facility]

## 2020-04-25 LAB — SARS CORONAVIRUS 2 (TAT 6-24 HRS): SARS Coronavirus 2: NEGATIVE

## 2020-04-28 ENCOUNTER — Other Ambulatory Visit: Payer: Self-pay

## 2020-04-28 ENCOUNTER — Ambulatory Visit
Admission: RE | Admit: 2020-04-28 | Discharge: 2020-04-28 | Disposition: A | Discharge status: Home / Self Care | Payer: Medicare Other | Attending: Gastroenterology | Admitting: Gastroenterology

## 2020-04-28 ENCOUNTER — Ambulatory Visit: Payer: Medicare Other | Admitting: Anesthesiology

## 2020-04-28 ENCOUNTER — Encounter: Payer: Self-pay | Admitting: Gastroenterology

## 2020-04-28 ENCOUNTER — Encounter
Admission: RE | Disposition: A | Discharge status: Home / Self Care | Payer: Self-pay | Source: Home / Self Care | Attending: Gastroenterology

## 2020-04-28 DIAGNOSIS — E785 Hyperlipidemia, unspecified: Secondary | ICD-10-CM | POA: Insufficient documentation

## 2020-04-28 DIAGNOSIS — Z79899 Other long term (current) drug therapy: Secondary | ICD-10-CM | POA: Diagnosis not present

## 2020-04-28 DIAGNOSIS — M1990 Unspecified osteoarthritis, unspecified site: Secondary | ICD-10-CM | POA: Diagnosis not present

## 2020-04-28 DIAGNOSIS — Z8371 Family history of colonic polyps: Secondary | ICD-10-CM | POA: Diagnosis not present

## 2020-04-28 DIAGNOSIS — I10 Essential (primary) hypertension: Secondary | ICD-10-CM | POA: Insufficient documentation

## 2020-04-28 DIAGNOSIS — Z96643 Presence of artificial hip joint, bilateral: Secondary | ICD-10-CM | POA: Insufficient documentation

## 2020-04-28 DIAGNOSIS — N400 Enlarged prostate without lower urinary tract symptoms: Secondary | ICD-10-CM | POA: Insufficient documentation

## 2020-04-28 DIAGNOSIS — R1011 Right upper quadrant pain: Secondary | ICD-10-CM

## 2020-04-28 DIAGNOSIS — K5790 Diverticulosis of intestine, part unspecified, without perforation or abscess without bleeding: Secondary | ICD-10-CM | POA: Diagnosis not present

## 2020-04-28 DIAGNOSIS — Z800 Family history of malignant neoplasm of digestive organs: Secondary | ICD-10-CM | POA: Diagnosis not present

## 2020-04-28 DIAGNOSIS — K5730 Diverticulosis of large intestine without perforation or abscess without bleeding: Secondary | ICD-10-CM | POA: Insufficient documentation

## 2020-04-28 DIAGNOSIS — R197 Diarrhea, unspecified: Secondary | ICD-10-CM | POA: Diagnosis not present

## 2020-04-28 DIAGNOSIS — Z7901 Long term (current) use of anticoagulants: Secondary | ICD-10-CM | POA: Diagnosis not present

## 2020-04-28 DIAGNOSIS — Z87891 Personal history of nicotine dependence: Secondary | ICD-10-CM | POA: Diagnosis not present

## 2020-04-28 DIAGNOSIS — I4891 Unspecified atrial fibrillation: Secondary | ICD-10-CM | POA: Diagnosis not present

## 2020-04-28 DIAGNOSIS — Z791 Long term (current) use of non-steroidal anti-inflammatories (NSAID): Secondary | ICD-10-CM | POA: Insufficient documentation

## 2020-04-28 DIAGNOSIS — K529 Noninfective gastroenteritis and colitis, unspecified: Secondary | ICD-10-CM | POA: Insufficient documentation

## 2020-04-28 DIAGNOSIS — D126 Benign neoplasm of colon, unspecified: Secondary | ICD-10-CM | POA: Diagnosis not present

## 2020-04-28 DIAGNOSIS — Z96651 Presence of right artificial knee joint: Secondary | ICD-10-CM | POA: Diagnosis not present

## 2020-04-28 DIAGNOSIS — D1339 Benign neoplasm of other parts of small intestine: Secondary | ICD-10-CM | POA: Insufficient documentation

## 2020-04-28 DIAGNOSIS — K219 Gastro-esophageal reflux disease without esophagitis: Secondary | ICD-10-CM | POA: Insufficient documentation

## 2020-04-28 DIAGNOSIS — K649 Unspecified hemorrhoids: Secondary | ICD-10-CM | POA: Diagnosis not present

## 2020-04-28 DIAGNOSIS — R194 Change in bowel habit: Secondary | ICD-10-CM | POA: Diagnosis not present

## 2020-04-28 DIAGNOSIS — K648 Other hemorrhoids: Secondary | ICD-10-CM | POA: Diagnosis not present

## 2020-04-28 HISTORY — PX: COLONOSCOPY WITH PROPOFOL: SHX5780

## 2020-04-28 SURGERY — COLONOSCOPY WITH PROPOFOL
Anesthesia: General

## 2020-04-28 MED ORDER — PROPOFOL 500 MG/50ML IV EMUL
INTRAVENOUS | Status: DC | PRN
  Administered 2020-04-28: 150 ug/kg/min via INTRAVENOUS

## 2020-04-28 MED ORDER — SODIUM CHLORIDE 0.9 % IV SOLN: INTRAVENOUS | Status: DC

## 2020-04-28 MED ORDER — PROPOFOL 10 MG/ML IV BOLUS
INTRAVENOUS | Status: DC | PRN
  Administered 2020-04-28: 60 mg via INTRAVENOUS

## 2020-04-28 MED ORDER — LIDOCAINE HCL (CARDIAC) PF 100 MG/5ML IV SOSY
PREFILLED_SYRINGE | INTRAVENOUS | Status: DC | PRN
  Administered 2020-04-28: 50 mg via INTRAVENOUS

## 2020-04-28 NOTE — Interval H&P Note (Signed)
History and Physical Interval Note:  04/28/2020 10:21 AM  Mark Padilla  has presented today for surgery, with the diagnosis of Change in bowel habits R19.4 diarrhea R19.7.  The various methods of treatment have been discussed with the patient and family. After consideration of risks, benefits and other options for treatment, the patient has consented to  Procedure(s): COLONOSCOPY WITH PROPOFOL (N/A) as a surgical intervention.  The patient's history has been reviewed, patient examined, no change in status, stable for surgery.  I have reviewed the patient's chart and labs.  Questions were answered to the patient's satisfaction.     Lynell Greenhouse [employer]

## 2020-04-28 NOTE — Anesthesia Preprocedure Evaluation (Signed)
Anesthesia Evaluation  Patient identified by MRN, date of birth, ID band Patient awake    Reviewed: Allergy & Precautions, H&P , NPO status , Patient's Chart, lab work & pertinent test results, reviewed documented beta blocker date and time   History of Anesthesia Complications Negative for: history of anesthetic complications  Airway Mallampati: I  TM Distance: >3 FB Neck ROM: full    Dental  (+) Dental Advidsory Given, Edentulous Upper, Edentulous Lower   Pulmonary neg shortness of breath, asthma (seasonal) , neg sleep apnea, neg COPD, neg recent URI, former smoker,    Pulmonary exam normal breath sounds clear to auscultation       Cardiovascular Exercise Tolerance: Good hypertension, (-) angina(-) Past MI and (-) Cardiac Stents + dysrhythmias Atrial Fibrillation (-) Valvular Problems/Murmurs Rhythm:regular Rate:Normal     Neuro/Psych negative neurological ROS  negative psych ROS   GI/Hepatic Neg liver ROS, GERD  ,  Endo/Other  negative endocrine ROS  Renal/GU negative Renal ROS  negative genitourinary   Musculoskeletal   Abdominal   Peds  Hematology negative hematology ROS (+)   Anesthesia Other Findings Past Medical History: No date: Allergic rhinitis No date: Atrial fibrillation ([facility])     Comment:  chronic persistent, failed DCCV x3 in 2000 No date: BPH (benign prostatic hypertrophy) No date: Cervical disc disease No date: Diverticulitis No date: ED (erectile dysfunction) No date: GERD (gastroesophageal reflux disease) No date: Hyperlipidemia No date: Hypertension No date: Impaired fasting glucose No date: Lumbar disc disease No date: Osteoarthritis No date: Sleep disturbance   Reproductive/Obstetrics negative OB ROS                             Anesthesia Physical Anesthesia Plan  ASA: III  Anesthesia Plan: General   Post-op Pain Management:    Induction:  Intravenous  PONV Risk Score and Plan: 2 and Propofol infusion and TIVA  Airway Management Planned: Natural Airway and Nasal Cannula  Additional Equipment:   Intra-op Plan:   Post-operative Plan:   Informed Consent: I have reviewed the patients History and Physical, chart, labs and discussed the procedure including the risks, benefits and alternatives for the proposed anesthesia with the patient or authorized representative who has indicated his/her understanding and acceptance.     Dental Advisory Given  Plan Discussed with: Anesthesiologist, CRNA and Surgeon  Anesthesia Plan Comments:         Anesthesia Quick Evaluation

## 2020-04-28 NOTE — Anesthesia Procedure Notes (Signed)
Date/Time: 04/28/2020 11:13 AM Performed by: Johnna Acosta, CRNA Pre-anesthesia Checklist: Patient identified, Emergency Drugs available, Suction available, Patient being monitored and Timeout performed Patient Re-evaluated:Patient Re-evaluated prior to induction Oxygen Delivery Method: Nasal cannula Preoxygenation: Pre-oxygenation with 100% oxygen Induction Type: IV induction

## 2020-04-28 NOTE — Transfer of Care (Signed)
Immediate Anesthesia Transfer of Care Note  Patient: Mark Padilla  Procedure(s) Performed: COLONOSCOPY WITH PROPOFOL (N/A )  Patient Location: PACU  Anesthesia Type:General  Level of Consciousness: sedated  Airway & Oxygen Therapy: Patient Spontanous Breathing  Post-op Assessment: Report given to RN and Post -op Vital signs reviewed and stable  Post vital signs: Reviewed and stable  Last Vitals:  Vitals Value Taken Time  BP 121/78 04/28/20 1134  Temp    Pulse 115 04/28/20 1134  Resp 11 04/28/20 1134  SpO2 98 % 04/28/20 1134  Vitals shown include unvalidated device data.  Last Pain:  Vitals:   04/28/20 1012  TempSrc: Temporal  PainSc: 0-No pain         Complications: No apparent anesthesia complications

## 2020-04-28 NOTE — Op Note (Signed)
[facility] Patient Name: Mark Padilla Procedure Date: 04/28/2020 11:00 AM MRN: [id number] Account #: [id number] Date of Birth: 05-Oct-1935 Admit Type: Outpatient Age: 85 Room: [facility] ENDO ROOM 4 Gender: Male Note Status: Finalized Procedure:             Colonoscopy Indications:           Chronic diarrhea Providers:             Lucilla Lame MD, MD Medicines:             Propofol per Anesthesia Complications:         No immediate complications. Procedure:             Pre-Anesthesia Assessment:                        - Prior to the procedure, a History and Physical was                         performed, and patient medications and allergies were                         reviewed. The patient's tolerance of previous                         anesthesia was also reviewed. The risks and benefits                         of the procedure and the sedation options and risks                         were discussed with the patient. All questions were                         answered, and informed consent was obtained. Prior                         Anticoagulants: The patient has taken no previous                         anticoagulant or antiplatelet agents. ASA Grade                         Assessment: II - A patient with mild systemic disease.                         After reviewing the risks and benefits, the patient                         was deemed in satisfactory condition to undergo the                         procedure.                        After obtaining informed consent, the colonoscope was                         passed under direct vision. Throughout the procedure,  the patient's blood pressure, pulse, and oxygen                         saturations were monitored continuously. The                         Colonoscope was introduced through the anus and                         advanced to the the terminal ileum. The colonoscopy                          was performed without difficulty. The patient                         tolerated the procedure well. The quality of the bowel                         preparation was excellent. Findings:      The terminal ileum appeared normal. Biopsies were taken with a cold       forceps for histology.      Multiple small-mouthed diverticula were found in the sigmoid colon.      Non-bleeding internal hemorrhoids were found during retroflexion. The       hemorrhoids were Grade II (internal hemorrhoids that prolapse but reduce       spontaneously).      Random biopsies were obtained with cold forceps for histology randomly. Impression:            - The examined portion of the ileum was normal.                         Biopsied.                        - Diverticulosis in the sigmoid colon.                        - Non-bleeding internal hemorrhoids.                        - Random biopsies were obtained. Recommendation:        - Discharge patient to home.                        - Resume previous diet.                        - Continue present medications.                        - Await pathology results. Procedure Code(s):     --- Professional ---                        [phone], Colonoscopy, flexible; with biopsy, single or                         multiple Diagnosis Code(s):     --- Professional ---  K52.9, Noninfective gastroenteritis and colitis,                         unspecified CPT copyright 2019 American Medical Association. All rights reserved. The codes documented in this report are preliminary and upon coder review may  be revised to meet current compliance requirements. Lucilla Lame MD, MD 04/28/2020 11:31:08 AM This report has been signed electronically. Number of Addenda: 0 Note Initiated On: 04/28/2020 11:00 AM Scope Withdrawal Time: 0 hours 6 minutes 54 seconds  Total Procedure Duration: 0 hours 9 minutes 39 seconds  Estimated Blood Loss:   Estimated blood loss: none.      [facility]

## 2020-04-28 NOTE — Anesthesia Postprocedure Evaluation (Signed)
Anesthesia Post Note  Patient: Mark Padilla  Procedure(s) Performed: COLONOSCOPY WITH PROPOFOL (N/A )  Patient location during evaluation: Endoscopy Anesthesia Type: General Level of consciousness: awake and alert Pain management: pain level controlled Vital Signs Assessment: post-procedure vital signs reviewed and stable Respiratory status: spontaneous breathing, nonlabored ventilation, respiratory function stable and patient connected to nasal cannula oxygen Cardiovascular status: blood pressure returned to baseline and stable Postop Assessment: no apparent nausea or vomiting Anesthetic complications: no     Last Vitals:  Vitals:   04/28/20 1154 04/28/20 1204  BP: (!) 152/79 134/86  Pulse: 73 70  Resp: 12 14  Temp:    SpO2: 100% 99%    Last Pain:  Vitals:   04/28/20 1204  TempSrc:   PainSc: 0-No pain                 Martha Clan

## 2020-04-29 ENCOUNTER — Encounter: Payer: Self-pay | Admitting: *Deleted

## 2020-04-29 ENCOUNTER — Ambulatory Visit (INDEPENDENT_AMBULATORY_CARE_PROVIDER_SITE_OTHER): Payer: Medicare Other

## 2020-04-29 ENCOUNTER — Telehealth: Payer: Self-pay

## 2020-04-29 DIAGNOSIS — Z7901 Long term (current) use of anticoagulants: Secondary | ICD-10-CM

## 2020-04-29 LAB — SURGICAL PATHOLOGY

## 2020-04-29 NOTE — Telephone Encounter (Signed)
Pt left VM that he had a colonoscopy yesterday and was taken off of his coumadin and not given instructions on when to restart or what his dosing should be. '  Contacted Villa Herb, coumadin clinic nurse at other [facility] locations and she advised to increase dose today, tomorrow and the next day to 7.5mg , then resume normal dosing and recheck on 6/15.   Contacted pt and he reports he was off of coumadin 5 days. He restarted with normal dose, 5mg , yesterday. Advised to increase dose today, tomorrow and the next day to 7.5mg , then resume normal dosing and recheck on 6/15. Pt has been scheduled for 6/15

## 2020-04-29 NOTE — Telephone Encounter (Signed)
noted 

## 2020-04-29 NOTE — Patient Instructions (Addendum)
Pre visit review using our clinic review tool, if applicable. No additional management support is needed unless otherwise documented below in the visit note.  Advised to increase dose today, tomorrow and the next day to 7.5mg , then resume normal dosing and recheck on 6/15. Pt has been scheduled for 6/15.

## 2020-04-30 ENCOUNTER — Encounter: Payer: Self-pay | Admitting: Gastroenterology

## 2020-05-05 ENCOUNTER — Other Ambulatory Visit: Payer: Self-pay

## 2020-05-05 ENCOUNTER — Ambulatory Visit (INDEPENDENT_AMBULATORY_CARE_PROVIDER_SITE_OTHER): Payer: Medicare Other

## 2020-05-05 DIAGNOSIS — Z7901 Long term (current) use of anticoagulants: Secondary | ICD-10-CM | POA: Diagnosis not present

## 2020-05-05 LAB — POCT INR: INR: 1.8 — AB (ref 2.0–3.0)

## 2020-05-05 NOTE — Patient Instructions (Addendum)
Pre visit review using our clinic review tool, if applicable. No additional management support is needed unless otherwise documented below in the visit  Increase dose today to 7.5mg  then continue taking 5mg  daily except take 7.5mg  on Sunday and recheck in 3 wks.

## 2020-05-06 ENCOUNTER — Ambulatory Visit
Admission: RE | Admit: 2020-05-06 | Discharge: 2020-05-06 | Disposition: A | Discharge status: Home / Self Care | Payer: Medicare Other | Source: Ambulatory Visit | Attending: Gastroenterology | Admitting: Gastroenterology

## 2020-05-06 ENCOUNTER — Other Ambulatory Visit: Payer: Self-pay

## 2020-05-06 DIAGNOSIS — R1011 Right upper quadrant pain: Secondary | ICD-10-CM | POA: Diagnosis not present

## 2020-05-06 DIAGNOSIS — K7689 Other specified diseases of liver: Secondary | ICD-10-CM | POA: Diagnosis not present

## 2020-05-06 DIAGNOSIS — K760 Fatty (change of) liver, not elsewhere classified: Secondary | ICD-10-CM | POA: Diagnosis not present

## 2020-05-06 MED ORDER — TECHNETIUM TC 99M MEBROFENIN IV KIT
5.0000 | PACK | Freq: Once | INTRAVENOUS | Status: AC | PRN
  Administered 2020-05-06: 5.37 via INTRAVENOUS

## 2020-05-07 ENCOUNTER — Ambulatory Visit: Payer: Medicare Other

## 2020-05-08 ENCOUNTER — Telehealth: Payer: Self-pay

## 2020-05-08 NOTE — Telephone Encounter (Signed)
-----   Message from Lucilla Lame, MD sent at 05/07/2020  5:06 PM EDT ----- Let the patient know that his ultrasound showed a fatty liver with some cysts that be worrisome.  Gallbladder was functioning at a normal level there is no sign of obstruction of the gallbladder.

## 2020-05-08 NOTE — Telephone Encounter (Signed)
Pt notified of [place] and HIDA scan results.

## 2020-05-28 ENCOUNTER — Ambulatory Visit (INDEPENDENT_AMBULATORY_CARE_PROVIDER_SITE_OTHER): Payer: Medicare Other

## 2020-05-28 ENCOUNTER — Other Ambulatory Visit: Payer: Self-pay

## 2020-05-28 DIAGNOSIS — Z7901 Long term (current) use of anticoagulants: Secondary | ICD-10-CM | POA: Diagnosis not present

## 2020-05-28 LAB — POCT INR: INR: 2 (ref 2.0–3.0)

## 2020-05-28 NOTE — Patient Instructions (Addendum)
Pre visit review using our clinic review tool, if applicable. No additional management support is needed unless otherwise documented below in the visit note.  Continue taking 5mg  daily except take 7.5mg  on Sunday and recheck in 4 wks.

## 2020-06-02 ENCOUNTER — Other Ambulatory Visit: Payer: Self-pay | Admitting: Internal Medicine

## 2020-06-02 DIAGNOSIS — Z7901 Long term (current) use of anticoagulants: Secondary | ICD-10-CM

## 2020-06-02 NOTE — Telephone Encounter (Signed)
Pt compliant with coumadin management. Sent in script 

## 2020-06-25 ENCOUNTER — Ambulatory Visit (INDEPENDENT_AMBULATORY_CARE_PROVIDER_SITE_OTHER): Payer: Medicare Other

## 2020-06-25 ENCOUNTER — Other Ambulatory Visit: Payer: Self-pay

## 2020-06-25 DIAGNOSIS — Z7901 Long term (current) use of anticoagulants: Secondary | ICD-10-CM

## 2020-06-25 LAB — POCT INR: INR: 1.9 — AB (ref 2.0–3.0)

## 2020-06-25 NOTE — Patient Instructions (Addendum)
Pre visit review using our clinic review tool, if applicable. No additional management support is needed unless otherwise documented below in the visit note.  Increase dose today to 7.5mg  then change weekly dose to take 5mg  daily except take 7.5mg  on Sunday and Thursday and recheck in 4 wks.

## 2020-07-14 DIAGNOSIS — H524 Presbyopia: Secondary | ICD-10-CM | POA: Diagnosis not present

## 2020-07-14 DIAGNOSIS — Z961 Presence of intraocular lens: Secondary | ICD-10-CM | POA: Diagnosis not present

## 2020-07-14 DIAGNOSIS — H04123 Dry eye syndrome of bilateral lacrimal glands: Secondary | ICD-10-CM | POA: Diagnosis not present

## 2020-07-14 DIAGNOSIS — H43813 Vitreous degeneration, bilateral: Secondary | ICD-10-CM | POA: Diagnosis not present

## 2020-07-23 ENCOUNTER — Other Ambulatory Visit: Payer: Self-pay

## 2020-07-23 ENCOUNTER — Ambulatory Visit (INDEPENDENT_AMBULATORY_CARE_PROVIDER_SITE_OTHER): Payer: Medicare Other

## 2020-07-23 DIAGNOSIS — Z7901 Long term (current) use of anticoagulants: Secondary | ICD-10-CM

## 2020-07-23 LAB — POCT INR: INR: 2.5 (ref 2.0–3.0)

## 2020-07-23 NOTE — Patient Instructions (Addendum)
Pre visit review using our clinic review tool, if applicable. No additional management support is needed unless otherwise documented below in the visit note.  Continue 5mg  daily except take 7.5mg  on Sunday and Thursday and recheck in 6 wks.

## 2020-08-03 ENCOUNTER — Other Ambulatory Visit: Payer: Self-pay | Admitting: Internal Medicine

## 2020-08-14 DIAGNOSIS — L821 Other seborrheic keratosis: Secondary | ICD-10-CM | POA: Diagnosis not present

## 2020-08-14 DIAGNOSIS — D225 Melanocytic nevi of trunk: Secondary | ICD-10-CM | POA: Diagnosis not present

## 2020-08-14 DIAGNOSIS — L570 Actinic keratosis: Secondary | ICD-10-CM | POA: Diagnosis not present

## 2020-08-14 DIAGNOSIS — Z85828 Personal history of other malignant neoplasm of skin: Secondary | ICD-10-CM | POA: Diagnosis not present

## 2020-08-14 DIAGNOSIS — Z08 Encounter for follow-up examination after completed treatment for malignant neoplasm: Secondary | ICD-10-CM | POA: Diagnosis not present

## 2020-08-27 ENCOUNTER — Other Ambulatory Visit: Payer: Self-pay | Admitting: Internal Medicine

## 2020-08-27 NOTE — Telephone Encounter (Signed)
Last filled 04-24-20 #180 Last OV 04-13-20 Next OV 02-26-21 [facility]

## 2020-08-31 ENCOUNTER — Ambulatory Visit: Payer: Medicare Other | Attending: Internal Medicine

## 2020-08-31 DIAGNOSIS — Z23 Encounter for immunization: Secondary | ICD-10-CM

## 2020-08-31 NOTE — Progress Notes (Signed)
   Covid-19 Vaccination Clinic  Name:  Mark Padilla    MRN: [id number] DOB: 08-27-1935  08/31/2020  Mark Padilla was observed post Covid-19 immunization for 30 minutes based on pre-vaccination screening without incident. He was provided with Vaccine Information Sheet and instruction to access the V-Safe system.   Mark Padilla was instructed to call 911 with any severe reactions post vaccine: Marland Kitchen Difficulty breathing  . Swelling of face and throat  . A fast heartbeat  . A bad rash all over body  . Dizziness and weakness

## 2020-09-03 ENCOUNTER — Ambulatory Visit (INDEPENDENT_AMBULATORY_CARE_PROVIDER_SITE_OTHER): Payer: Medicare Other

## 2020-09-03 ENCOUNTER — Other Ambulatory Visit: Payer: Self-pay

## 2020-09-03 DIAGNOSIS — Z7901 Long term (current) use of anticoagulants: Secondary | ICD-10-CM | POA: Diagnosis not present

## 2020-09-03 DIAGNOSIS — Z23 Encounter for immunization: Secondary | ICD-10-CM

## 2020-09-03 LAB — POCT INR: INR: 2.7 (ref 2.0–3.0)

## 2020-09-03 NOTE — Patient Instructions (Addendum)
Pre visit review using our clinic review tool, if applicable. No additional management support is needed unless otherwise documented below in the visit note.  Continue 5mg  daily except take 7.5mg  on Sunday and Thursday and recheck in 5 wks.

## 2020-09-03 NOTE — Progress Notes (Signed)
Pt requested high dose flu vaccine while here.  Administered flu vaccine. Pt tolerated well.

## 2020-10-05 ENCOUNTER — Telehealth: Payer: Self-pay | Admitting: Internal Medicine

## 2020-10-05 NOTE — Telephone Encounter (Signed)
Pt has been RS. Pt appreciative

## 2020-10-05 NOTE — Telephone Encounter (Signed)
Pt called needing to r/s his 11/18 pt/inr  He is out of town

## 2020-10-08 ENCOUNTER — Other Ambulatory Visit: Payer: Self-pay

## 2020-10-08 ENCOUNTER — Ambulatory Visit: Payer: Medicare Other

## 2020-10-08 ENCOUNTER — Ambulatory Visit (INDEPENDENT_AMBULATORY_CARE_PROVIDER_SITE_OTHER): Payer: Medicare Other

## 2020-10-08 DIAGNOSIS — Z7901 Long term (current) use of anticoagulants: Secondary | ICD-10-CM

## 2020-10-08 LAB — POCT INR: INR: 4.8 — AB (ref 2.0–3.0)

## 2020-10-08 NOTE — Patient Instructions (Addendum)
Pre visit review using our clinic review tool, if applicable. No additional management support is needed unless otherwise documented below in the visit note.   Hold dose tomorrow and Saturday then continue 5mg  daily except take 7.5mg  on Sunday and Thursday and recheck in 4 wks.

## 2020-11-05 ENCOUNTER — Other Ambulatory Visit: Payer: Self-pay

## 2020-11-05 ENCOUNTER — Ambulatory Visit (INDEPENDENT_AMBULATORY_CARE_PROVIDER_SITE_OTHER): Payer: Medicare Other

## 2020-11-05 DIAGNOSIS — Z7901 Long term (current) use of anticoagulants: Secondary | ICD-10-CM

## 2020-11-05 LAB — POCT INR: INR: 2.5 (ref 2.0–3.0)

## 2020-11-05 NOTE — Patient Instructions (Addendum)
Pre visit review using our clinic review tool, if applicable. No additional management support is needed unless otherwise documented below in the visit note.  Continue 5mg  daily except take 7.5mg  on Sunday and Thursday and recheck in 4 wks.

## 2020-11-27 DIAGNOSIS — L858 Other specified epidermal thickening: Secondary | ICD-10-CM | POA: Diagnosis not present

## 2020-11-27 DIAGNOSIS — D485 Neoplasm of uncertain behavior of skin: Secondary | ICD-10-CM | POA: Diagnosis not present

## 2020-11-27 DIAGNOSIS — L570 Actinic keratosis: Secondary | ICD-10-CM | POA: Diagnosis not present

## 2020-12-03 ENCOUNTER — Ambulatory Visit (INDEPENDENT_AMBULATORY_CARE_PROVIDER_SITE_OTHER): Payer: Medicare Other

## 2020-12-03 ENCOUNTER — Other Ambulatory Visit: Payer: Self-pay

## 2020-12-03 DIAGNOSIS — Z7901 Long term (current) use of anticoagulants: Secondary | ICD-10-CM | POA: Diagnosis not present

## 2020-12-03 LAB — POCT INR: INR: 1.7 — AB (ref 2.0–3.0)

## 2020-12-03 NOTE — Patient Instructions (Addendum)
Pre visit review using our clinic review tool, if applicable. No additional management support is needed unless otherwise documented below in the visit note.  Take 15 mg today (3 tablets total) and then continue 5mg  daily except take 7.5mg  on Sunday and Thursday and recheck in 4 wks.

## 2020-12-31 ENCOUNTER — Ambulatory Visit (INDEPENDENT_AMBULATORY_CARE_PROVIDER_SITE_OTHER): Payer: Medicare Other

## 2020-12-31 ENCOUNTER — Other Ambulatory Visit: Payer: Self-pay

## 2020-12-31 ENCOUNTER — Telehealth: Payer: Self-pay | Admitting: Cardiovascular Disease

## 2020-12-31 DIAGNOSIS — Z7901 Long term (current) use of anticoagulants: Secondary | ICD-10-CM | POA: Diagnosis not present

## 2020-12-31 LAB — POCT INR: INR: 2.4 (ref 2.0–3.0)

## 2020-12-31 NOTE — Patient Instructions (Addendum)
Pre visit review using our clinic review tool, if applicable. No additional management support is needed unless otherwise documented below in the visit note.  Continue 5mg  daily except take 7.5mg  on Sunday and Thursday and recheck in 6 wks per patient request.

## 2020-12-31 NOTE — Telephone Encounter (Signed)
3 attempts to schedule fu appt from recall list.   Deleting recall.   

## 2021-01-01 ENCOUNTER — Telehealth: Payer: Self-pay | Admitting: Cardiovascular Disease

## 2021-01-01 NOTE — Telephone Encounter (Signed)
3 attempts to schedule fu appt from recall list.   Deleting recall.   

## 2021-01-28 NOTE — Progress Notes (Signed)
Cardiology Office Note  Date:  01/29/2021   ID:  Mark Padilla, DOB 08/22/1935, MRN [id number]  PCP:  Colon Branch, MD   Chief Complaint  Patient presents with  . 12 month follow up     "doing well." Medications reviewed by the patient verbally.     HPI:  86 year-old gentleman with PMH of  chronic atrial fibrillation on warfarin,  HTN,  DC cardioversion that was unsuccessful,  hyperlipidemia,  hip replacement on the left in April 2012,  right knee replacement August 2012 back surgery July 2014 with decompression previously on Zocor, Changed to Lipitor He presents for routine follow-up of his atrial fibrillation  LOV 12/2018 And since we have last seen him reports having some abdominal pain Workup , colonoscopy Came off pain meds Symptoms have resolved, etiology as far as he is concerned has resolved  Walks several days a week Denies any chest pain concerning for angina, no shortness of breath  Works in garden  Chronic back and joint pain herniated disk, L3 to 4, Seen previously at [facility], Dr. Benson Setting  EKG on today's visit shows atrial fibrillation with ventricular rate 59  bpm, no significant ST or T-wave changes   Other past medical history He reports he is doing better since his low back surgery in July 2014. He is walking 30 minutes per day. Recent upper respiratory infection. Took Mucinex D  and it and had malaise, palpitations. Took in one week to get over this feeling. Now feels back to normal.  no complaints on low-dose Lipitor   He denies chest pain, shortness of breath, edema, or palpitations. no lightheadedness or near syncope.     PMH:   has a past medical history of Allergic rhinitis, Atrial fibrillation (HCC), BPH (benign prostatic hypertrophy), Cervical disc disease, Diverticulitis, ED (erectile dysfunction), GERD (gastroesophageal reflux disease), Hyperlipidemia, Hypertension, Impaired fasting glucose, Lumbar disc disease, Osteoarthritis, and Sleep  disturbance.  PSH:    Past Surgical History:  Procedure Laterality Date  . CATARACT EXTRACTION, BILATERAL  2014  . COLONOSCOPY  2013  . COLONOSCOPY WITH PROPOFOL N/A 04/28/2020   Procedure: COLONOSCOPY WITH PROPOFOL;  Surgeon: Lucilla Lame, MD;  Location: [facility] ENDOSCOPY;  Service: Endoscopy;  Laterality: N/A;  . HEMIARTHROPLASTY SHOULDER FRACTURE  10/2004   left  . JOINT REPLACEMENT  4/12   Left hip--Dr Jefm Falkenstein  . KNEE ARTHROSCOPY  06/1998   right Jefm Ledger)  . LUMBAR LAMINECTOMY  06/10/13   [facility]  . REPLACEMENT UNICONDYLAR JOINT KNEE  8/12   Dr Jefm Friedel  . SHOULDER SURGERY  1950's   dislocation  . SHOULDER SURGERY  12/2004   right  . THR--left  4/12   Dr Jefm Celani    Current Outpatient Medications  Medication Sig Dispense Refill  . ALPRAZolam (XANAX) 1 MG tablet TAKE 1 TABLET BY MOUTH  TWICE DAILY AS NEEDED 180 tablet 0  . Ascorbic Acid (VITAMIN C) 500 MG tablet Take 500 mg by mouth daily.    Marland Kitchen atorvastatin (LIPITOR) 10 MG tablet TAKE 1 TABLET BY MOUTH  DAILY 90 tablet 3  . Azelastine HCl 137 MCG/SPRAY SOLN INSTILL 2 SPRAYS INTO BOTH  NOSTRILS AT BEDTIME. 60 mL 3  . fluticasone (FLONASE) 50 MCG/ACT nasal spray USE 2 SPRAYS IN BOTH  NOSTRILS DAILY 48 g 3  . gabapentin (NEURONTIN) 300 MG capsule TAKE 1 CAPSULE BY MOUTH 3  TIMES DAILY 270 capsule 3  . hydrochlorothiazide (HYDRODIURIL) 25 MG tablet TAKE 1 TABLET BY MOUTH  DAILY 90 tablet  3  . loratadine (CLARITIN) 10 MG tablet Take 1 tablet (10 mg total) by mouth daily. 90 tablet 3  . meloxicam (MOBIC) 7.5 MG tablet TAKE 1 TABLET BY MOUTH  DAILY 90 tablet 3  . montelukast (SINGULAIR) 10 MG tablet TAKE 1 TABLET BY MOUTH AT  BEDTIME 90 tablet 3  . Multiple Vitamin (MULTIVITAMIN) capsule Take 1 capsule by mouth daily.    . Na Sulfate-K Sulfate-Mg Sulf (SUPREP BOWEL PREP KIT) 17.5-3.13-1.6 GM/177ML SOLN Take 1 kit by mouth as directed. 354 mL 0  . omeprazole (PRILOSEC) 40 MG capsule TAKE 1 CAPSULE BY MOUTH  DAILY 90 capsule 3  .  Potassium 75 MG TABS Take by mouth daily.    . Potassium Gluconate 2.5 MEQ TABS Take by mouth.    . tamsulosin (FLOMAX) 0.4 MG CAPS capsule TAKE 1 CAPSULE BY MOUTH  DAILY 90 capsule 3  . warfarin (COUMADIN) 5 MG tablet TAKE BY MOUTH AS DIRECTED  BY COUMADIN CLINIC 90 tablet 3  . WIXELA INHUB 250-50 MCG/DOSE AEPB SMARTSIG:1 Puff(s) Via Inhaler Every 12 Hours     No current facility-administered medications for this visit.     Allergies:   Simvastatin, Mucinex d [pseudoephedrine-guaifenesin er], Pseudoephedrine, Undecylenic acid, Flexeril [cyclobenzaprine hcl], Metaxalone, and Skelaxin   Social History:  The patient  reports that he quit smoking about 58 years ago. His smoking use included cigarettes. He has never used smokeless tobacco. He reports current alcohol use. He reports that he does not use drugs.   Family History:   family history includes Colon polyps in his brother; Stomach cancer in his mother; Stroke in his father.    Review of Systems: Review of Systems  Constitutional: Negative.   HENT: Negative.   Respiratory: Negative.   Cardiovascular: Negative.   Gastrointestinal: Negative.   Musculoskeletal: Positive for back pain and joint pain.  Neurological: Negative.   Psychiatric/Behavioral: Negative.   All other systems reviewed and are negative.   PHYSICAL EXAM: VS:  BP 138/64 (BP Location: Left Arm, Patient Position: Sitting, Cuff Size: Normal)   Pulse (!) 59   Ht _0  (1.702 m)   Wt 182 lb 2 oz (82.6 kg)   SpO2 98%   BMI 28.52 kg/m  , BMI Body mass index is 28.52 kg/m. Constitutional:  oriented to person, place, and time. No distress.  HENT:  Head: Grossly normal Eyes:  no discharge. No scleral icterus.  Neck: No JVD, no carotid bruits  Cardiovascular: Regular rate and rhythm, no murmurs appreciated Pulmonary/Chest: Clear to auscultation bilaterally, no wheezes or rails Abdominal: Soft.  no distension.  no tenderness.  Musculoskeletal: Normal range of  motion Neurological:  normal muscle tone. Coordination normal. No atrophy Skin: Skin warm and dry Psychiatric: normal affect, pleasant  Recent Labs: 02/25/2020: ALT 21; BUN 15; Creatinine, Ser 0.79; Hemoglobin 14.5; Platelets 256.0; Potassium 4.4; Sodium 133    Lipid Panel Lab Results  Component Value Date   CHOL 127 02/25/2020   HDL 51.40 02/25/2020   LDLCALC 67 02/25/2020   TRIG 41.0 02/25/2020      Wt Readings from Last 3 Encounters:  01/29/21 182 lb 2 oz (82.6 kg)  04/28/20 175 lb (79.4 kg)  04/21/20 180 lb 12.8 oz (82 kg)     ASSESSMENT AND PLAN:  Atrial fibrillation, unspecified type ([place]) - Plan: EKG 12-Lead Rate well controlled, tolerating warfarin Discussed NOAC, He is not particularly interested at this time  on warfarin for 30 years managed well atenolol held 2020 for  bradycardia  Mixed hyperlipidemia Cholesterol is at goal on the current lipid regimen. No changes to the medications were made.  Chronic back pain laminectomy at [facility] Dr. Delilah Shan  Active No pain meds  Essential hypertension Blood pressure is well controlled on today's visit. No changes made to the medications. Needs labs through PMD: low sodium    Total encounter time more than 25 minutes  Greater than 50% was spent in counseling and coordination of care with the patient     No orders of the defined types were placed in this encounter.    Signed, Esmond Plants, M.D., Ph.D. 01/29/2021  [place], [place]

## 2021-01-29 ENCOUNTER — Other Ambulatory Visit: Payer: Self-pay

## 2021-01-29 ENCOUNTER — Ambulatory Visit: Payer: Medicare Other | Admitting: Cardiovascular Disease

## 2021-01-29 ENCOUNTER — Encounter: Payer: Self-pay | Admitting: Cardiovascular Disease

## 2021-01-29 VITALS — BP 138/64 | HR 59 | Ht 67.0 in | Wt 182.1 lb

## 2021-01-29 DIAGNOSIS — I10 Essential (primary) hypertension: Secondary | ICD-10-CM

## 2021-01-29 DIAGNOSIS — E782 Mixed hyperlipidemia: Secondary | ICD-10-CM | POA: Diagnosis not present

## 2021-01-29 DIAGNOSIS — I4821 Permanent atrial fibrillation: Secondary | ICD-10-CM

## 2021-01-29 NOTE — Patient Instructions (Signed)
Medication Instructions:  No changes  If you need a refill on your cardiac medications before your next appointment, please call your pharmacy.    Lab work: No new labs needed   If you have labs (blood work) drawn today and your tests are completely normal, you will receive your results only by: . MyChart Message (if you have MyChart) OR . A paper copy in the mail If you have any lab test that is abnormal or we need to change your treatment, we will call you to review the results.   Testing/Procedures: No new testing needed   Follow-Up: At [facility], you and your health needs are our priority.  As part of our continuing mission to provide you with exceptional heart care, we have created designated Provider Care Teams.  These Care Teams include your primary Cardiologist (physician) and Advanced Practice Providers (APPs -  Physician Assistants and Nurse Practitioners) who all work together to provide you with the care you need, when you need it.  . You will need a follow up appointment in 12 months  . Providers on your designated Care Team:   . Christopher Berge, NP . Ryan Dunn, PA-C . Jacquelyn Visser, PA-C  Any Other Special Instructions Will Be Listed Below (If Applicable).  COVID-19 Vaccine Information can be found at: [email or web address] For questions related to vaccine distribution or appointments, please email [email or web address] or call [phone].     

## 2021-01-31 ENCOUNTER — Other Ambulatory Visit: Payer: Self-pay | Admitting: Internal Medicine

## 2021-02-11 ENCOUNTER — Ambulatory Visit (INDEPENDENT_AMBULATORY_CARE_PROVIDER_SITE_OTHER): Payer: Medicare Other

## 2021-02-11 ENCOUNTER — Other Ambulatory Visit: Payer: Self-pay

## 2021-02-11 DIAGNOSIS — Z5181 Encounter for therapeutic drug level monitoring: Secondary | ICD-10-CM

## 2021-02-11 DIAGNOSIS — Z7901 Long term (current) use of anticoagulants: Secondary | ICD-10-CM

## 2021-02-11 LAB — POCT INR: INR: 1.9 — AB (ref 2.0–3.0)

## 2021-02-11 NOTE — Patient Instructions (Addendum)
Pre visit review using our clinic review tool, if applicable. No additional management support is needed unless otherwise documented below in the visit note.  Increase dose to take 15 mg (3 tablets) and then continue 5mg  daily except take 7.5mg  on Sunday and Thursday and recheck in 4 weeks.

## 2021-02-13 ENCOUNTER — Other Ambulatory Visit: Payer: Self-pay | Admitting: Internal Medicine

## 2021-02-13 DIAGNOSIS — Z7901 Long term (current) use of anticoagulants: Secondary | ICD-10-CM

## 2021-02-14 NOTE — Progress Notes (Signed)
Agree. Thanks

## 2021-02-15 NOTE — Telephone Encounter (Signed)
Alprazolam last filled 10-11-20 #180 Last OV 04-13-20 Next OV 03-02-21 [facility]

## 2021-02-26 ENCOUNTER — Encounter: Payer: Medicare Other | Admitting: Internal Medicine

## 2021-03-02 ENCOUNTER — Encounter: Payer: Self-pay | Admitting: Internal Medicine

## 2021-03-02 ENCOUNTER — Ambulatory Visit (INDEPENDENT_AMBULATORY_CARE_PROVIDER_SITE_OTHER): Payer: Medicare Other | Admitting: Internal Medicine

## 2021-03-02 ENCOUNTER — Other Ambulatory Visit: Payer: Self-pay

## 2021-03-02 VITALS — BP 124/74 | HR 57 | Temp 98.1°F | Ht 65.5 in | Wt 181.0 lb

## 2021-03-02 DIAGNOSIS — M545 Low back pain, unspecified: Secondary | ICD-10-CM | POA: Diagnosis not present

## 2021-03-02 DIAGNOSIS — I4821 Permanent atrial fibrillation: Secondary | ICD-10-CM | POA: Diagnosis not present

## 2021-03-02 DIAGNOSIS — G8929 Other chronic pain: Secondary | ICD-10-CM

## 2021-03-02 DIAGNOSIS — Z7189 Other specified counseling: Secondary | ICD-10-CM

## 2021-03-02 DIAGNOSIS — F39 Unspecified mood [affective] disorder: Secondary | ICD-10-CM

## 2021-03-02 DIAGNOSIS — M5136 Other intervertebral disc degeneration, lumbar region: Secondary | ICD-10-CM

## 2021-03-02 DIAGNOSIS — Z0000 Encounter for general adult medical examination without abnormal findings: Secondary | ICD-10-CM | POA: Diagnosis not present

## 2021-03-02 DIAGNOSIS — N400 Enlarged prostate without lower urinary tract symptoms: Secondary | ICD-10-CM

## 2021-03-02 DIAGNOSIS — J4520 Mild intermittent asthma, uncomplicated: Secondary | ICD-10-CM | POA: Diagnosis not present

## 2021-03-02 DIAGNOSIS — I10 Essential (primary) hypertension: Secondary | ICD-10-CM | POA: Diagnosis not present

## 2021-03-02 LAB — COMPREHENSIVE METABOLIC PANEL
ALT: 22 U/L (ref 0–53)
AST: 35 U/L (ref 0–37)
Albumin: 4.2 g/dL (ref 3.5–5.2)
Alkaline Phosphatase: 96 U/L (ref 39–117)
BUN: 9 mg/dL (ref 6–23)
CO2: 29 mEq/L (ref 19–32)
Calcium: 9.6 mg/dL (ref 8.4–10.5)
Chloride: 97 mEq/L (ref 96–112)
Creatinine, Ser: 0.78 mg/dL (ref 0.40–1.50)
GFR: 82.04 mL/min (ref >60.00)
Glucose, Bld: 89 mg/dL (ref 70–99)
Potassium: 4.1 mEq/L (ref 3.5–5.1)
Sodium: 136 mEq/L (ref 135–145)
Total Bilirubin: 0.8 mg/dL (ref 0.2–1.2)
Total Protein: 7 g/dL (ref 6.0–8.3)

## 2021-03-02 LAB — LIPID PANEL
Cholesterol: 122 mg/dL (ref 0–200)
HDL: 48.9 mg/dL (ref >39.00)
LDL Cholesterol: 63 mg/dL (ref 0–99)
NonHDL: 73.41
Total CHOL/HDL Ratio: 3
Triglycerides: 53 mg/dL (ref 0.0–149.0)
VLDL: 10.6 mg/dL (ref 0.0–40.0)

## 2021-03-02 LAB — CBC
HCT: 43.5 % (ref 39.0–52.0)
Hemoglobin: 14.7 g/dL (ref 13.0–17.0)
MCHC: 33.9 g/dL (ref 30.0–36.0)
MCV: 91.7 fl (ref 78.0–100.0)
Platelets: 191 10*3/uL (ref 150.0–400.0)
RBC: 4.75 Mil/uL (ref 4.22–5.81)
RDW: 13.1 % (ref 11.5–15.5)
WBC: 4.7 10*3/uL (ref 4.0–10.5)

## 2021-03-02 LAB — T4, FREE: Free T4: 0.8 ng/dL (ref 0.60–1.60)

## 2021-03-02 MED ORDER — AZELASTINE HCL 137 MCG/SPRAY NA SOLN: NASAL | 11 refills | Status: DC

## 2021-03-02 NOTE — Progress Notes (Signed)
Hearing Screening   Method: Audiometry   125Hz  250Hz  500Hz  1000Hz  2000Hz  3000Hz  4000Hz  6000Hz  8000Hz   Right ear:   25 40 25  0    Left ear:   25 0 0  0    Vision Screening Comments: August 2021

## 2021-03-02 NOTE — Assessment & Plan Note (Signed)
Chronic and still severe at times Will refer to physiatry to see if injections might be reasonable to try again Uses the meloxicam

## 2021-03-02 NOTE — Assessment & Plan Note (Signed)
Gets irritability at times Uses the xanax prn and at night to help sleep

## 2021-03-02 NOTE — Assessment & Plan Note (Addendum)
Controlled with  inhaler prn only

## 2021-03-02 NOTE — Assessment & Plan Note (Signed)
Will proceed with the physiatry consult

## 2021-03-02 NOTE — Assessment & Plan Note (Signed)
I have personally reviewed the Medicare Annual Wellness questionnaire and have noted 1. The patient's medical and social history 2. Their use of alcohol, tobacco or illicit drugs 3. Their current medications and supplements 4. The patient's functional ability including ADL's, fall risks, home safety risks and hearing or visual             impairment. 5. Diet and physical activities 6. Evidence for depression or mood disorders  The patients weight, height, BMI and visual acuity have been recorded in the chart I have made referrals, counseling and provided education to the patient based review of the above and I have provided the pt with a written personalized care plan for preventive services.  I have provided you with a copy of your personalized plan for preventive services. Please take the time to review along with your updated medication list.  Done with cancer screening Tries to stay active--at least with walking Flu vaccine in the fall--and second COVID booster Td booster if any injury

## 2021-03-02 NOTE — Assessment & Plan Note (Signed)
See social history 

## 2021-03-02 NOTE — Progress Notes (Signed)
Subjective:    Patient ID: Mark Padilla, male    DOB: 10/07/1935, 86 y.o.   MRN: [id number]  HPI Here for Medicare wellness visit and follow up of chronic health conditions This visit occurred during the SARS-CoV-2 public health emergency.  Safety protocols were in place, including screening questions prior to the visit, additional usage of staff PPE, and extensive cleaning of exam room while observing appropriate contact time as indicated for disinfecting solutions.   Reviewed advanced directives Reviewed other doctors--[facility] --cardiology, Dr Morrison Old, Dr Ritter--optometrist No hospitalizations or surgery this year. Did have colonoscopy done. Thinks some of those symptoms were still from getting off oxycodone No tobacco products Occasional alcohol (vodka or beer) Vision is okay Some hearing loss on left  Had 1 fall--did injure his right 4th finger (was holding groceries and hooked shoe on storm door---fell onto couch) Stays active in garden--very sore last night after putting in his tomatoes Walks 2.5-3 miles daily --monitors on [employer] (at mall). Starting again at gym Independent with instrumental ADLs No sig memory loss--only with names  Continues on the coumadin Persistent atrial fibrillation Warfarin monitored here--did have increase in dose No palpitations Occasional indigestion but no apparent cardiac chest pain No dizziness or syncope Slight cold with cough---last week. Improved now Breathing is okay No edema  Taking the meloxicam regularly for pain Rare tylenol  Back is okay--though known herniated disc (occasionally to hip and leg) Wonders about other Rx  Continues on omeprazole daily Some heartburn--like with spicy food No dysphagia  Still uses the xanax regularly Mostly to help him sleep Will occasionally use it during the day--if he gets worked up (and irritated) No depression or anhedonia  Voids okay on tamsulosin Stream is slow--especially  first thing in AM Nocturia is rare  Current Outpatient Medications on File Prior to Visit  Medication Sig Dispense Refill  . ALPRAZolam (XANAX) 1 MG tablet TAKE 1 TABLET BY MOUTH  TWICE DAILY AS NEEDED 180 tablet 0  . Ascorbic Acid (VITAMIN C) 500 MG tablet Take 500 mg by mouth daily.    Marland Kitchen atorvastatin (LIPITOR) 10 MG tablet TAKE 1 TABLET BY MOUTH  DAILY 90 tablet 3  . Azelastine HCl 137 MCG/SPRAY SOLN INSTILL 2 SPRAYS INTO BOTH  NOSTRILS AT BEDTIME. 60 mL 3  . fluticasone (FLONASE) 50 MCG/ACT nasal spray USE 2 SPRAYS IN BOTH  NOSTRILS DAILY 48 g 3  . gabapentin (NEURONTIN) 300 MG capsule TAKE 1 CAPSULE BY MOUTH 3  TIMES DAILY 270 capsule 3  . hydrochlorothiazide (HYDRODIURIL) 25 MG tablet TAKE 1 TABLET BY MOUTH  DAILY 90 tablet 3  . loratadine (CLARITIN) 10 MG tablet Take 1 tablet (10 mg total) by mouth daily. 90 tablet 3  . meloxicam (MOBIC) 7.5 MG tablet TAKE 1 TABLET BY MOUTH  DAILY 90 tablet 3  . montelukast (SINGULAIR) 10 MG tablet TAKE 1 TABLET BY MOUTH AT  BEDTIME 90 tablet 3  . Multiple Vitamin (MULTIVITAMIN) capsule Take 1 capsule by mouth daily.    Marland Kitchen omeprazole (PRILOSEC) 40 MG capsule TAKE 1 CAPSULE BY MOUTH  DAILY 90 capsule 3  . Potassium 75 MG TABS Take by mouth daily.    . Potassium Gluconate 2.5 MEQ TABS Take by mouth.    . tamsulosin (FLOMAX) 0.4 MG CAPS capsule TAKE 1 CAPSULE BY MOUTH  DAILY 90 capsule 3  . warfarin (COUMADIN) 5 MG tablet TAKE BY MOUTH AS DIRECTED  BY COUMADIN CLINIC 90 tablet 3   No current facility-administered  medications on file prior to visit.    Allergies  Allergen Reactions  . Simvastatin     Listlessness, fatigue  . Mucinex D [Pseudoephedrine-Guaifenesin Er]   . Pseudoephedrine     Other reaction(s): Unknown  . Undecylenic Acid   . Flexeril [Cyclobenzaprine Hcl] Anxiety  . Metaxalone Anxiety  . Skelaxin Anxiety    Past Medical History:  Diagnosis Date  . Allergic rhinitis   . Atrial fibrillation ([place])    chronic persistent, failed  DCCV x3 in 2000  . BPH (benign prostatic hypertrophy)   . Cervical disc disease   . Diverticulitis   . ED (erectile dysfunction)   . GERD (gastroesophageal reflux disease)   . Hyperlipidemia   . Hypertension   . Impaired fasting glucose   . Lumbar disc disease   . Osteoarthritis   . Sleep disturbance     Past Surgical History:  Procedure Laterality Date  . CATARACT EXTRACTION, BILATERAL  2014  . COLONOSCOPY  2013  . COLONOSCOPY WITH PROPOFOL N/A 04/28/2020   Procedure: COLONOSCOPY WITH PROPOFOL;  Surgeon: Lucilla Lame, MD;  [facility] END[facility]COPY;  Service: Endoscopy;  Laterality: N/A;  . HEMIARTHROPLASTY SHOULDER FRACTURE  10/2004   left  . JOINT REPLACEMENT  4/12   Left hip--Dr Jefm Beavin  . KNEE ARTHROSCOPY  06/1998   right Jefm Tarkowski)  . LUMBAR LAMINECTOMY  06/10/13   [facility]  . REPLACEMENT UNICONDYLAR JOINT KNEE  8/12   Dr Jefm Surber  . SHOULDER SURGERY  1950's   dislocation  . SHOULDER SURGERY  12/2004   right  . THR--left  4/12   Dr Jefm Steenson    Family History  Problem Relation Age of Onset  . Stomach cancer Mother   . Stroke Father   . Colon polyps Brother   . Colon cancer Neg Hx     Social History   Socioeconomic History  . Marital status: Single    Spouse name: Not on file  . Number of children: 2  . Years of education: Not on file  . Highest education level: Not on file  Occupational History  . Occupation: retired, [employer] works part time [employer]: RETIRED  Tobacco Use  . Smoking status: Former Smoker    Types: Cigarettes    Quit date: 11/21/1962    Years since quitting: 58.3  . Smokeless tobacco: Never Used  Vaping Use  . Vaping Use: Never used  Substance and Sexual Activity  . Alcohol use: Yes    Alcohol/week: 0.0 standard drinks    Comment: occasional  . Drug use: No  . Sexual activity: Not on file  Other Topics Concern  . Not on file  Social History Narrative   Plans to do living will   Will designate daughter  [name] as health care POA.    Would accept resuscitation but no prolonged artificial ventilation   Would not want tube feeds if cognitively unaware   Social Determinants of Health   Financial Resource Strain: Not on file  Food Insecurity: Not on file  Transportation Needs: Not on file  Physical Activity: Not on file  Stress: Not on file  Social Connections: Not on file  Intimate Partner Violence: Not on file   Review of Systems Appetite is fine Weight is stable Generally sleeps okay Wears seat belt Teeth okay--full dentures Bowels move fine--no blood    Objective:   Physical Exam Constitutional:      Appearance: Normal appearance.  HENT:     Mouth/Throat:  Comments: No lesions Eyes:     Conjunctiva/sclera: Conjunctivae normal.     Pupils: Pupils are equal, round, and reactive to light.  Cardiovascular:     Rate and Rhythm: Normal rate. Rhythm irregular.     Pulses: Normal pulses.     Heart sounds: No murmur heard. No gallop.   Pulmonary:     Effort: Pulmonary effort is normal.     Breath sounds: Normal breath sounds. No wheezing or rales.  Abdominal:     Palpations: Abdomen is soft.     Tenderness: There is no abdominal tenderness.  Musculoskeletal:     Cervical back: Neck supple.     Right lower leg: No edema.     Left lower leg: No edema.  Lymphadenopathy:     Cervical: No cervical adenopathy.  Skin:    General: Skin is warm.     Findings: No rash.  Neurological:     Mental Status: He is alert and oriented to person, place, and time.     Comments: President-- "Pearson Grippe" [phone] [id number] Recall 3/3  Psychiatric:        Mood and Affect: Mood normal.        Behavior: Behavior normal.            Assessment & Plan:

## 2021-03-02 NOTE — Assessment & Plan Note (Signed)
Voids okay with the tamsulosin 

## 2021-03-02 NOTE — Assessment & Plan Note (Signed)
Persistent On the coumadin---doesn't really want to switch Rate fine without meds

## 2021-03-02 NOTE — Assessment & Plan Note (Signed)
BP Readings from Last 3 Encounters:  03/02/21 124/74  01/29/21 138/64  04/28/20 134/86   Good control on HCTZ

## 2021-03-11 ENCOUNTER — Other Ambulatory Visit: Payer: Self-pay

## 2021-03-11 ENCOUNTER — Ambulatory Visit (INDEPENDENT_AMBULATORY_CARE_PROVIDER_SITE_OTHER): Payer: Medicare Other

## 2021-03-11 DIAGNOSIS — Z7901 Long term (current) use of anticoagulants: Secondary | ICD-10-CM | POA: Diagnosis not present

## 2021-03-11 LAB — POCT INR: INR: 2 (ref 2.0–3.0)

## 2021-03-11 NOTE — Patient Instructions (Addendum)
Pre visit review using our clinic review tool, if applicable. No additional management support is needed unless otherwise documented below in the visit note.  Change weekly dose to take 5mg  daily except take 7.5mg  on Monday, Wednesday, and Friday and recheck in 4 weeks.

## 2021-03-16 ENCOUNTER — Other Ambulatory Visit: Payer: Self-pay | Admitting: Family Medicine

## 2021-03-16 DIAGNOSIS — M48061 Spinal stenosis, lumbar region without neurogenic claudication: Secondary | ICD-10-CM | POA: Diagnosis not present

## 2021-03-16 DIAGNOSIS — M4807 Spinal stenosis, lumbosacral region: Secondary | ICD-10-CM | POA: Diagnosis not present

## 2021-03-16 DIAGNOSIS — M5136 Other intervertebral disc degeneration, lumbar region: Secondary | ICD-10-CM | POA: Diagnosis not present

## 2021-03-16 DIAGNOSIS — M47816 Spondylosis without myelopathy or radiculopathy, lumbar region: Secondary | ICD-10-CM | POA: Diagnosis not present

## 2021-03-26 ENCOUNTER — Ambulatory Visit
Admission: RE | Admit: 2021-03-26 | Discharge: 2021-03-26 | Disposition: A | Discharge status: Home / Self Care | Payer: Medicare Other | Source: Ambulatory Visit | Attending: Family Medicine | Admitting: Family Medicine

## 2021-03-26 ENCOUNTER — Other Ambulatory Visit: Payer: Self-pay

## 2021-03-26 DIAGNOSIS — M4807 Spinal stenosis, lumbosacral region: Secondary | ICD-10-CM | POA: Diagnosis not present

## 2021-03-26 DIAGNOSIS — M48061 Spinal stenosis, lumbar region without neurogenic claudication: Secondary | ICD-10-CM | POA: Diagnosis not present

## 2021-03-26 DIAGNOSIS — M5126 Other intervertebral disc displacement, lumbar region: Secondary | ICD-10-CM | POA: Diagnosis not present

## 2021-03-30 DIAGNOSIS — M4807 Spinal stenosis, lumbosacral region: Secondary | ICD-10-CM | POA: Diagnosis not present

## 2021-03-30 DIAGNOSIS — M5136 Other intervertebral disc degeneration, lumbar region: Secondary | ICD-10-CM | POA: Diagnosis not present

## 2021-03-30 DIAGNOSIS — Z7901 Long term (current) use of anticoagulants: Secondary | ICD-10-CM | POA: Diagnosis not present

## 2021-03-31 DIAGNOSIS — M5126 Other intervertebral disc displacement, lumbar region: Secondary | ICD-10-CM | POA: Diagnosis not present

## 2021-03-31 DIAGNOSIS — Z7901 Long term (current) use of anticoagulants: Secondary | ICD-10-CM | POA: Diagnosis not present

## 2021-03-31 DIAGNOSIS — M5416 Radiculopathy, lumbar region: Secondary | ICD-10-CM | POA: Diagnosis not present

## 2021-04-08 ENCOUNTER — Ambulatory Visit: Payer: Medicare Other

## 2021-04-13 ENCOUNTER — Ambulatory Visit (INDEPENDENT_AMBULATORY_CARE_PROVIDER_SITE_OTHER): Payer: Medicare Other

## 2021-04-13 ENCOUNTER — Other Ambulatory Visit: Payer: Self-pay

## 2021-04-13 DIAGNOSIS — I4891 Unspecified atrial fibrillation: Secondary | ICD-10-CM | POA: Diagnosis not present

## 2021-04-13 DIAGNOSIS — Z5181 Encounter for therapeutic drug level monitoring: Secondary | ICD-10-CM

## 2021-04-13 LAB — POCT INR: INR: 3.5 — AB (ref 2.0–3.0)

## 2021-04-13 NOTE — Patient Instructions (Signed)
INR today 3.5.  Will hold dose tomorrow (5/25) as has already taken today.  Then will start decreased dosing of 5mg  daily EXCEPT 7.5 on Mondays and Fridays.  Recheck in 2 weeks.    No signs of bleeding, patient is aware of risks associated with supratherapeutic level and will go to ER if any concerns.

## 2021-04-16 ENCOUNTER — Telehealth: Payer: Self-pay

## 2021-04-16 DIAGNOSIS — Z7901 Long term (current) use of anticoagulants: Secondary | ICD-10-CM

## 2021-04-16 MED ORDER — WARFARIN SODIUM 5 MG PO TABS: ORAL_TABLET | ORAL | 1 refills | Status: DC

## 2021-04-16 NOTE — Telephone Encounter (Signed)
Fax received from [facility] [facility] requesting new script clarifying warfarin script. Sent in new script with new directions.

## 2021-04-21 DIAGNOSIS — M4726 Other spondylosis with radiculopathy, lumbar region: Secondary | ICD-10-CM | POA: Diagnosis not present

## 2021-04-21 DIAGNOSIS — M4807 Spinal stenosis, lumbosacral region: Secondary | ICD-10-CM | POA: Diagnosis not present

## 2021-04-21 DIAGNOSIS — M5136 Other intervertebral disc degeneration, lumbar region: Secondary | ICD-10-CM | POA: Diagnosis not present

## 2021-04-21 DIAGNOSIS — Z7901 Long term (current) use of anticoagulants: Secondary | ICD-10-CM | POA: Diagnosis not present

## 2021-04-28 ENCOUNTER — Telehealth: Payer: Self-pay | Admitting: Internal Medicine

## 2021-04-28 NOTE — Chronic Care Management (AMB) (Signed)
  Chronic Care Management   Outreach Note  04/28/2021 Name: KEMET NIJJAR MRN: [id number] DOB: 01-Sep-1935  Referred by: Venia Carbon, MD Reason for referral : No chief complaint on file.   An unsuccessful telephone outreach was attempted today. The patient was referred to the pharmacist for assistance with care management and care coordination.   Follow Up Plan:   Tatjana Dellinger Upstream Scheduler

## 2021-04-29 ENCOUNTER — Ambulatory Visit: Payer: Medicare Other

## 2021-04-29 ENCOUNTER — Telehealth: Payer: Self-pay | Admitting: Internal Medicine

## 2021-04-29 NOTE — Chronic Care Management (AMB) (Signed)
  Chronic Care Management   Note  04/29/2021 Name: Mark Padilla MRN: [id number] DOB: 18-Mar-1935  [name] is a 86 y.o. year old male who is a primary care patient of [place], Theophilus Kinds, MD. I reached out to [name] by phone today in response to a referral sent by [name]'s PCP, Venia Carbon, MD.   [name] was given information about Chronic Care Management services today including:  CCM service includes personalized support from designated clinical staff supervised by his physician, including individualized plan of care and coordination with other care providers 24/7 contact phone numbers for assistance for urgent and routine care needs. Service will only be billed when office clinical staff spend 20 minutes or more in a month to coordinate care. Only one practitioner may furnish and bill the service in a calendar month. The patient may stop CCM services at any time (effective at the end of the month) by phone call to the office staff.   Patient agreed to services and verbal consent obtained.   Follow up plan:   Tatjana Secretary/administrator

## 2021-04-30 ENCOUNTER — Telehealth: Payer: Self-pay

## 2021-04-30 NOTE — Telephone Encounter (Signed)
Pt missed coumadin clinic apt yesterday. LVM for pt. Need to talk to pt to decide when to RS. Please send call to this nurse.

## 2021-05-03 DIAGNOSIS — Z7901 Long term (current) use of anticoagulants: Secondary | ICD-10-CM | POA: Diagnosis not present

## 2021-05-04 DIAGNOSIS — M47816 Spondylosis without myelopathy or radiculopathy, lumbar region: Secondary | ICD-10-CM | POA: Diagnosis not present

## 2021-05-05 NOTE — Telephone Encounter (Signed)
Contacted pt who reports he had a back injection on 6/14 and they checked his INR on 6/13. They did not tell him what his INR was but he said he missed a dose the night before the injection so today he took an extra half a dose. Advised to continue normal dosing. Pt reported he will have another back injection on 6/28 and INR check on 6/27. Advised pt he should have apt here on 6/30. Pt agreed and was scheduled for apt. Pt appreciative of call. Advised if any changes before apt to contact the clinic. Pt verbalized understanding.

## 2021-05-06 NOTE — Telephone Encounter (Signed)
Okay noted

## 2021-05-14 DIAGNOSIS — D2261 Melanocytic nevi of right upper limb, including shoulder: Secondary | ICD-10-CM | POA: Diagnosis not present

## 2021-05-14 DIAGNOSIS — D2262 Melanocytic nevi of left upper limb, including shoulder: Secondary | ICD-10-CM | POA: Diagnosis not present

## 2021-05-14 DIAGNOSIS — Z0000 Encounter for general adult medical examination without abnormal findings: Secondary | ICD-10-CM | POA: Diagnosis not present

## 2021-05-14 DIAGNOSIS — X32XXXA Exposure to sunlight, initial encounter: Secondary | ICD-10-CM | POA: Diagnosis not present

## 2021-05-14 DIAGNOSIS — L570 Actinic keratosis: Secondary | ICD-10-CM | POA: Diagnosis not present

## 2021-05-14 DIAGNOSIS — D225 Melanocytic nevi of trunk: Secondary | ICD-10-CM | POA: Diagnosis not present

## 2021-05-14 DIAGNOSIS — Z85828 Personal history of other malignant neoplasm of skin: Secondary | ICD-10-CM | POA: Diagnosis not present

## 2021-05-14 DIAGNOSIS — L821 Other seborrheic keratosis: Secondary | ICD-10-CM | POA: Diagnosis not present

## 2021-05-17 DIAGNOSIS — Z7901 Long term (current) use of anticoagulants: Secondary | ICD-10-CM | POA: Diagnosis not present

## 2021-05-18 DIAGNOSIS — M47817 Spondylosis without myelopathy or radiculopathy, lumbosacral region: Secondary | ICD-10-CM | POA: Diagnosis not present

## 2021-05-20 ENCOUNTER — Ambulatory Visit (INDEPENDENT_AMBULATORY_CARE_PROVIDER_SITE_OTHER): Payer: Medicare Other

## 2021-05-20 ENCOUNTER — Other Ambulatory Visit: Payer: Self-pay

## 2021-05-20 DIAGNOSIS — Z7901 Long term (current) use of anticoagulants: Secondary | ICD-10-CM

## 2021-05-20 LAB — POCT INR: INR: 3 (ref 2.0–3.0)

## 2021-05-20 NOTE — Patient Instructions (Addendum)
Pre visit review using our clinic review tool, if applicable. No additional management support is needed unless otherwise documented below in the visit note.  Continue taking 5mg  daily EXCEPT 7.5 on Mondays and Fridays.  Recheck in 4 weeks.

## 2021-06-12 ENCOUNTER — Other Ambulatory Visit: Payer: Self-pay | Admitting: Internal Medicine

## 2021-06-12 DIAGNOSIS — Z7901 Long term (current) use of anticoagulants: Secondary | ICD-10-CM

## 2021-06-14 NOTE — Telephone Encounter (Signed)
Pt compliant with Coumadin management. Last OV 03/02/21. Sent in script

## 2021-06-15 ENCOUNTER — Other Ambulatory Visit: Payer: Self-pay

## 2021-06-15 ENCOUNTER — Ambulatory Visit (INDEPENDENT_AMBULATORY_CARE_PROVIDER_SITE_OTHER): Payer: Medicare Other

## 2021-06-15 DIAGNOSIS — Z7901 Long term (current) use of anticoagulants: Secondary | ICD-10-CM

## 2021-06-15 LAB — POCT INR: INR: 2 (ref 2.0–3.0)

## 2021-06-15 NOTE — Patient Instructions (Addendum)
Pre visit review using our clinic review tool, if applicable. No additional management support is needed unless otherwise documented below in the visit note.  Continue taking '5mg'$  daily EXCEPT 7.5 on Mondays and Fridays.  Recheck in 4 weeks.

## 2021-06-16 DIAGNOSIS — M47816 Spondylosis without myelopathy or radiculopathy, lumbar region: Secondary | ICD-10-CM | POA: Diagnosis not present

## 2021-06-17 ENCOUNTER — Other Ambulatory Visit: Payer: Self-pay | Admitting: Internal Medicine

## 2021-06-22 ENCOUNTER — Telehealth: Payer: Self-pay

## 2021-06-22 NOTE — Chronic Care Management (AMB) (Addendum)
Chronic Care Management Pharmacy Assistant   Name: Mark Padilla  MRN: [id number] DOB: [birth year]  Mark Padilla is an 86 y.o. year old male who presents for his initial CCM visit with the clinical pharmacist.  Reason for Encounter: Initial Questions   Conditions to be addressed/monitored: HTN and HLD  Recent office visits:  03/02/21- Dr.Letvak PCP-Patient presented for AWV- referral to physiatry   Recent consult visits:  7/27/22[facility] -Patient presented for procedure-Lumbar Spine Ablation no medication changes 05/18/21- [facility]-Patient presented for procedure -Lumbar injections no medication changes 05/04/21- [facility]-Lumbar spine procedure-no medication changes 04/21/21- [facility]-Patient presented for follow up from injection no medication changes    Hospital visits:  None in previous 6 months  Medications: Outpatient Encounter Medications as of 06/22/2021  Medication Sig   ALPRAZolam (XANAX) 1 MG tablet TAKE 1 TABLET BY MOUTH  TWICE DAILY AS NEEDED   Ascorbic Acid (VITAMIN C) 500 MG tablet Take 500 mg by mouth daily.   atorvastatin (LIPITOR) 10 MG tablet TAKE 1 TABLET BY MOUTH  DAILY   Azelastine HCl 137 MCG/SPRAY SOLN INSTILL 2 SPRAYS INTO BOTH  NOSTRILS AT BEDTIME.   fluticasone (FLONASE) 50 MCG/ACT nasal spray USE 2 SPRAYS IN BOTH  NOSTRILS DAILY   gabapentin (NEURONTIN) 300 MG capsule TAKE 1 CAPSULE BY MOUTH 3  TIMES DAILY   hydrochlorothiazide (HYDRODIURIL) 25 MG tablet TAKE 1 TABLET BY MOUTH  DAILY   loratadine (CLARITIN) 10 MG tablet Take 1 tablet (10 mg total) by mouth daily.   meloxicam (MOBIC) 7.5 MG tablet TAKE 1 TABLET BY MOUTH  DAILY   montelukast (SINGULAIR) 10 MG tablet TAKE 1 TABLET BY MOUTH AT  BEDTIME   Multiple Vitamin (MULTIVITAMIN) capsule Take 1 capsule by mouth daily.   omeprazole (PRILOSEC) 40 MG capsule TAKE 1 CAPSULE BY MOUTH  DAILY   Potassium 75 MG TABS Take by mouth daily.   Potassium Gluconate 2.5 MEQ TABS  Take by mouth.   tamsulosin (FLOMAX) 0.4 MG CAPS capsule TAKE 1 CAPSULE BY MOUTH  DAILY   warfarin (COUMADIN) 5 MG tablet TAKE 1 TABLET BY MOUTH  DAILY EXCEPT TAKE 1 AND 1/2 TABLETS ON MONDAYS AND  FRIDAYS OR AS DIRECTED BY  COUMADIN CLINIC   No facility-administered encounter medications on file as of 06/22/2021.    Lab Results  Component Value Date/Time   HGBA1C 5.7 12/30/2013 12:37 PM   HGBA1C 5.7 05/07/2012 11:13 AM     BP Readings from Last 3 Encounters:  03/02/21 124/74  01/29/21 138/64  04/28/20 134/86    Patient contacted to review initial questions prior to visit with Debbora Dus.  Have you seen any other providers since your last visit with PCP? Yes  [facility] -several visits for lumbar spine injections for back pain,no medication changes  Any changes in your medications or health? No  Any side effects from any medications? No  Do you have an symptoms or problems not managed by your medications? No  Any concerns about your health right now? No  Has your provider asked that you check blood pressure,or follow special diet at home? The patient reports he does check his blood pressure at home and he is not on any special diet  Do you get any type of exercise on a regular basis? Yes The patient walks every day for exercise and works in the garden   Can you think of a goal you would like to reach for your health? No  Do  you have any problems getting your medications? No  Is there anything that you would like to discuss during the appointment? No   Mark Padilla was reminded to have all medications, supplements and any blood glucose and blood pressure readings available for review with Debbora Dus, Pharm. D, at his telephone visit on 07/05/2021 at 3:15pm .    Star Rating Drugs:  Medication:  Last Fill: Day Supply Atorvastatin '10mg'$  04/23/21  90   Care Gaps: Last annual wellness visit:03/04/2021 Last eye exam / retinopathy screening:n/a   Debbora Dus, CPP notified  Avel Sensor, [place] Assistant [phone]  I have reviewed the care management and care coordination activities outlined in this encounter and I am certifying that I agree with the content of this note. No further action required.  Debbora Dus, PharmD Clinical Pharmacist [facility] [phone]

## 2021-06-29 ENCOUNTER — Telehealth: Payer: Medicare Other

## 2021-07-05 ENCOUNTER — Other Ambulatory Visit: Payer: Self-pay

## 2021-07-05 ENCOUNTER — Ambulatory Visit (INDEPENDENT_AMBULATORY_CARE_PROVIDER_SITE_OTHER): Payer: Medicare Other

## 2021-07-05 DIAGNOSIS — E782 Mixed hyperlipidemia: Secondary | ICD-10-CM

## 2021-07-05 DIAGNOSIS — I10 Essential (primary) hypertension: Secondary | ICD-10-CM | POA: Diagnosis not present

## 2021-07-05 NOTE — Progress Notes (Signed)
Chronic Care Management Pharmacy Note  07/05/2021 Name:  MAIJOR HORNIG MRN:  [id number] DOB:  08-May-1935  Subjective: Mark Padilla is an 86 y.o. year old male who is a primary patient of Letvak, Theophilus Kinds, MD.  The CCM team was consulted for assistance with disease management and care coordination needs.    Engaged with patient by telephone for initial visit in response to provider referral for pharmacy case management and/or care coordination services.   Consent to Services:  The patient was given the following information about Chronic Care Management services today, agreed to services, and gave verbal consent: 1. CCM service includes personalized support from designated clinical staff supervised by the primary care provider, including individualized plan of care and coordination with other care providers 2. 24/7 contact phone numbers for assistance for urgent and routine care needs. 3. Service will only be billed when office clinical staff spend 20 minutes or more in a month to coordinate care. 4. Only one practitioner may furnish and bill the service in a calendar month. 5.The patient may stop CCM services at any time (effective at the end of the month) by phone call to the office staff. 6. The patient will be responsible for cost sharing (co-pay) of up to 20% of the service fee (after annual deductible is met). Patient agreed to services and consent obtained.  Patient Care Team: Venia Carbon, MD as PCP - General (Internal Medicine) Minna Merritts, MD as Consulting Physician (Cardiology) Debbora Dus, [facility] as Pharmacist (Pharmacist)  Recent office visits:  04/13/21 - Anticoagulation - INR check, 2 (goal 2-3).  03/02/21 [facility] PCP- Patient presented for AWV. Referral to physiatry.   Recent consult visits:  06/16/21 [facility] - Patient presented for procedure - Lumbar Spine Ablation. No medication changes. 05/18/21 - [facility] - Patient presented for  procedure - lumbar injections. No medication changes. 05/04/21 - [facility] - Lumbar spine procedure. No medication changes. 04/21/21 - [facility] - Patient presented for follow up from injection. No medication changes.   Hospital visits:  None in previous 6 months   Objective:  Lab Results  Component Value Date   CREATININE 0.78 03/02/2021   BUN 9 03/02/2021   GFR 82.04 03/02/2021   GFRNONAA 55 (L) 01/20/2014   GFRAA >60 01/20/2014   NA 136 03/02/2021   K 4.1 03/02/2021   CALCIUM 9.6 03/02/2021   CO2 29 03/02/2021   GLUCOSE 89 03/02/2021    Lab Results  Component Value Date/Time   HGBA1C 5.7 12/30/2013 12:37 PM   HGBA1C 5.7 05/07/2012 11:13 AM   GFR 82.04 03/02/2021 10:00 AM   GFR 93.62 02/25/2020 10:32 AM    Lab Results  Component Value Date   CHOL 122 03/02/2021   HDL 48.90 03/02/2021   LDLCALC 63 03/02/2021   LDLDIRECT 131.5 04/28/2011   TRIG 53.0 03/02/2021   CHOLHDL 3 03/02/2021    Hepatic Function Latest Ref Rng & Units 03/02/2021 02/25/2020 01/29/2019  Total Protein 6.0 - 8.3 g/dL 7.0 6.9 6.9  Albumin 3.5 - 5.2 g/dL 4.2 4.3 4.4  AST 0 - 37 U/L 35 29 27  ALT 0 - 53 U/L 22 21 19   Alk Phosphatase 39 - 117 U/L 96 76 69  Total Bilirubin 0.2 - 1.2 mg/dL 0.8 0.6 0.4  Bilirubin, Direct 0.0 - 0.3 mg/dL - - -    Lab Results  Component Value Date/Time   TSH 0.81 12/30/2013 12:37 PM   TSH 1.50 12/04/2012  10:43 AM   FREET4 0.80 03/02/2021 10:00 AM   FREET4 0.82 02/25/2020 10:32 AM    CBC Latest Ref Rng & Units 03/02/2021 02/25/2020 01/29/2019  WBC 4.0 - 10.5 K/uL 4.7 5.7 5.2  Hemoglobin 13.0 - 17.0 g/dL 14.7 14.5 14.6  Hematocrit 39.0 - 52.0 % 43.5 42.3 41.9  Platelets 150.0 - 400.0 K/uL 191.0 256.0 181.0   No results found for: VD25OH  Clinical ASCVD: No  The ASCVD Risk score Mikey Bussing DC Jr., et al., 2013) failed to calculate for the following reasons:   The 2013 ASCVD risk score is only valid for ages 62 to 35    Depression screen PHQ 2/9 03/02/2021  02/25/2020 01/29/2019  Decreased Interest 0 0 0  Down, Depressed, Hopeless 0 0 0  PHQ - 2 Score 0 0 0  Some recent data might be hidden     Social History   Tobacco Use  Smoking Status Former   Types: Cigarettes   Quit date: 11/21/1962   Years since quitting: 58.6  Smokeless Tobacco Never   BP Readings from Last 3 Encounters:  03/02/21 124/74  01/29/21 138/64  04/28/20 134/86   Pulse Readings from Last 3 Encounters:  03/02/21 (!) 57  01/29/21 (!) 59  04/28/20 70   Wt Readings from Last 3 Encounters:  03/02/21 181 lb (82.1 kg)  01/29/21 182 lb 2 oz (82.6 kg)  04/28/20 175 lb (79.4 kg)   BMI Readings from Last 3 Encounters:  03/02/21 29.66 kg/m  01/29/21 28.52 kg/m  04/28/20 28.25 kg/m    Assessment/Interventions: Review of patient past medical history, allergies, medications, health status, including review of consultants reports, laboratory and other test data, was performed as part of comprehensive evaluation and provision of chronic care management services.   SDOH:  (Social Determinants of Health) assessments and interventions performed: Yes SDOH Interventions    Flowsheet Row Most Recent Value  SDOH Interventions   Financial Strain Interventions Intervention Not Indicated      SDOH Screenings   Alcohol Screen: Not on file  Depression (PHQ2-9): Low Risk    PHQ-2 Score: 0  Financial Resource Strain: Low Risk    Difficulty of Paying Living Expenses: Not very hard  Food Insecurity: Not on file  Housing: Not on file  Physical Activity: Not on file  Social Connections: Not on file  Stress: Not on file  Tobacco Use: Medium Risk   Smoking Tobacco Use: Former   Smokeless Tobacco Use: Never  Transportation Needs: Not on file    [place]  Allergies  Allergen Reactions   Simvastatin     Listlessness, fatigue   Mucinex D [Pseudoephedrine-Guaifenesin Er]    Pseudoephedrine     Other reaction(s): Unknown   Undecylenic Acid    Flexeril  [Cyclobenzaprine Hcl] Anxiety   Metaxalone Anxiety   Skelaxin Anxiety    Medications Reviewed Today     Reviewed by Debbora Dus, [facility] (Pharmacist) on 07/05/21 at 1554  Med List Status: <None>   Medication Order Taking? Sig Documenting Provider Last Dose Status Informant  ALPRAZolam (XANAX) 1 MG tablet [id number] Yes TAKE 1 TABLET BY MOUTH  TWICE DAILY AS NEEDED Venia Carbon, MD Taking Active   Ascorbic Acid (VITAMIN C) 500 MG tablet [id number] Yes Take 500 mg by mouth daily. [provider] Taking Active   atorvastatin (LIPITOR) 10 MG tablet [id number] Yes TAKE 1 TABLET BY MOUTH  DAILY Venia Carbon, MD Taking Active   Azelastine HCl 137 MCG/SPRAY SOLN [id number] Yes  INSTILL 2 SPRAYS INTO BOTH  NOSTRILS AT BEDTIME. Venia Carbon, MD Taking Active   fluticasone [facility]) 50 MCG/ACT nasal spray [id number] Yes USE 2 SPRAYS IN BOTH  NOSTRILS DAILY Venia Carbon, MD Taking Active   gabapentin (NEURONTIN) 300 MG capsule [id number] Yes TAKE 1 CAPSULE BY MOUTH 3  TIMES DAILY Venia Carbon, MD Taking Active   hydrochlorothiazide (HYDRODIURIL) 25 MG tablet [id number] Yes TAKE 1 TABLET BY MOUTH  DAILY Venia Carbon, MD Taking Active   loratadine (CLARITIN) 10 MG tablet [id number] Yes Take 1 tablet (10 mg total) by mouth daily. Venia Carbon, MD Taking Active   meloxicam [facility]) 7.5 MG tablet [id number] Yes TAKE 1 TABLET BY MOUTH  DAILY Venia Carbon, MD Taking Active   montelukast (SINGULAIR) 10 MG tablet [id number] Yes TAKE 1 TABLET BY MOUTH AT  BEDTIME Venia Carbon, MD Taking Active   Multiple Vitamin (MULTIVITAMIN) capsule [id number] Yes Take 1 capsule by mouth daily. [provider] Taking Active   omeprazole (PRILOSEC) 40 MG capsule [id number] Yes TAKE 1 CAPSULE BY MOUTH  DAILY Venia Carbon, MD Taking Active   Potassium Gluconate 2.5 MEQ TABS [id number] Yes Take by mouth. [provider] Taking Active   tamsulosin (FLOMAX) 0.4 MG CAPS capsule  [id number] Yes TAKE 1 CAPSULE BY MOUTH  DAILY Venia Carbon, MD Taking Active   warfarin (COUMADIN) 5 MG tablet [id number] Yes TAKE 1 TABLET BY MOUTH  DAILY EXCEPT TAKE 1 AND 1/2 TABLETS ON MONDAYS AND  FRIDAYS OR AS DIRECTED BY  COUMADIN CLINIC Venia Carbon, MD Taking Active             Patient Active Problem List   Diagnosis Date Noted   Asthmatic bronchitis 11/26/2019   Seborrheic keratoses 09/05/2017   Advance directive discussed with patient 12/31/2014   Encounter for therapeutic drug monitoring 01/13/2014   Back pain 05/15/2013   Actinic keratoses 12/04/2012   BPH without obstruction/lower urinary tract symptoms    Routine general medical examination at a health care facility 04/28/2011   Osteoarthrosis involving more than one site 02/18/2011   Degenerative disc disease, lumbar 05/04/2010   Mild mood disorder ([place]) 05/05/2009   Hyperlipidemia 02/19/2007   Essential hypertension 02/19/2007   Atrial fibrillation ([place]) 02/19/2007   ALLERGIC RHINITIS 02/19/2007   DIVERTICULOSIS, COLON 02/19/2007   ERECTILE DYSFUNCTION, 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    Immunization History  Administered Date(s) Administered   Fluad Quad(high Dose 65+) 08/15/2019, 09/03/2020   Influenza Split 09/16/2011   Influenza Whole 10/21/2001, 11/05/2007, 09/01/2008, 11/02/2009, 08/18/2010   Influenza,inj,Quad PF,6+ Mos 09/09/2013, 10/13/2014, 09/22/2015, 08/29/2016, 09/05/2017, 10/25/2018   PFIZER(Purple Top)SARS-COV-2 Vaccination 12/24/2019, 01/14/2020, 08/31/2020   Pneumococcal Conjugate-13 12/31/2014   Pneumococcal Polysaccharide-23 04/28/2011, 01/16/2018   Tdap 04/28/2011   Zoster, Live 04/28/2011    Conditions to be addressed/monitored:  Hypertension, Hyperlipidemia, Atrial Fibrillation, GERD, and BPH  Care Plan : [place]  Updates made by Debbora Dus, [place] since 07/05/2021 12:00 AM     Problem: CHL AMB "PATIENT-SPECIFIC PROBLEM"      Long-Range Goal: Disease Management    Start Date: 07/05/2021  Priority: High  Note:   Current Barriers:  None identified  Pharmacist Clinical Goal(s):  Patient will contact provider office for questions/concerns as evidenced notation of same in electronic health record through collaboration with PharmD and provider.   Interventions: 1:1 collaboration with Venia Carbon, MD regarding development and update of comprehensive plan of care as evidenced by provider attestation and  co-signature Inter-disciplinary care team collaboration (see longitudinal plan of care) Comprehensive medication review performed; medication list updated in electronic medical record  Hypertension (BP goal <140/90) -Controlled - per clinic readings -Current treatment: HCTZ 25 mg - 1 tablet daily -Medications previously tried: none   -Current home readings: he checks it couple times/week - usually runs 120-124/68-70 -Current dietary habits: none -Current exercise habits: walks most days at [facility] - 2-3 miles today, gardens  -Denies hypotensive/hypertensive symptoms; If he stands up too quickly after activity, imbalanced. Reports a few months ago he fell bringing in groceries, no injuries. No falls since then. -Educated on BP goals and benefits of medications for prevention of heart attack, stroke and kidney damage; -Counseled to monitor BP at home 2-3 times/week, document, and provide log at future appointments -Recommended to continue current medication  Hyperlipidemia: (LDL goal < 100) -Controlled - per last LDL within goal -Current treatment: Atorvastatin 10 mg daily -Medications previously tried: Simvastatin -Educated on Cholesterol goals;  -Recommended to continue current medication  Atrial Fibrillation (Goal: prevent stroke and major bleeding) -Controlled - followed by cardiology, no symptoms  -Current treatment: Rate control: none Anticoagulation: Warfarin 5 mg - as directed (1 daily and 1 and 1/2 Monday and Friday) -Medications  previously tried: none reported -INR followed by [facility] -Home BP and HR readings: see HTN section, controlled  -Counseled on bleeding risk associated with NSAID and importance of self-monitoring for signs/symptoms of bleeding; avoidance of NSAIDs due to increased bleeding risk with anticoagulants; He is prescribed Meloxicam 7.5 mg - 1 tablet daily for arthritis. He is aware of risks. No s/s of bleeding today. He avoids all OTC NSAIDs. -Recommended to continue current medication  BPH (Goal: Symtpom control) -Controlled -  per patient report  -Current treatment  Tamsulosin 0.4 mg - 1 capsule daily -Medications previously tried: none -No nocturia, no daytime symptoms   -Recommended to continue current medication  Mild Mood Disorder (Goal: Pt reported - Improve sleep) -Controlled -  per patient report  -Current treatment  Xanax - takes 1 at bedtime -Medications previously tried: none -Cannot sleep without it, takes 1 every night. No next day grogginess. Feels well rested next day. -Recommended to continue current medication  GERD (Goal: Improve symptoms) -Controlled -  per patient report -Current treatment  Omeprazole 40 mg - 1 capsule daily (bedtime) -Medications previously tried: none -Symptoms most controlled. Avoids trigger foods. -Recommended to continue current medication  Allergies (Goal: Improve symptoms) -Controlled -  per patient report -States he has year-around allergies, it does worsen in Spring. Prior saw allergy specialist. Currently managed by Dr. Silvio Pate. Denies SOB.  -Current treatment  Loratadine 10 mg - 1 tablet daily Montelukast 10 mg - 1 tablet at bedtime  Flonase - 2 sprays on nostrils in morning Azestaline 137 mcg - Instill 2 sprays in both nostrils at bedtime -Medications previously tried: none -Symptoms most controlled. Avoids trigger foods. -Recommended to continue current medication  GERD (Goal: Improve symptoms) -Controlled -  per patient  report -Current treatment  Omeprazole 40 mg - 1 capsule daily (bedtime) -Medications previously tried: none -Symptoms most controlled. Avoids trigger foods. -Recommended to continue current medication  OTC -  Spectravite Multivitamin - 1 daily Potassium Gluconate 595 mg - 1 daily Vitamin C 500 mg - 1 daily Gabapentin 300 mg - 1 TID (pt wants to get off - we discussed taper strategies) Meloxicam 7.5 mg - 1 tablet daily  Patient Goals/Self-Care Activities Patient will:  - take medications as prescribed -  contact CCM pharmacist for any health concerns  Follow Up Plan: Telephone follow up appointment with care management team member scheduled for:  12 months -CMA contact in 6 months for HTN review       Medication Assistance: None required.  Patient affirms current coverage meets needs.  Compliance/Adherence/Medication fill history: Care Gaps: none  Star-Rating Drugs: Medication:                Last Fill:         Day Supply Atorvastatin 73m       04/23/21              90  [facility]  [address] [id number]: [phone]: [phone] Uses pill box? Yes - 1 for bedtime and 1 for morning meds, 7 day Pt endorses 100% compliance  We discussed: Current pharmacy is preferred with insurance plan and patient is satisfied with pharmacy services Patient decided to: Continue current medication management strategy  Care Plan and Follow Up Patient Decision:  Patient agrees to Care Plan and Follow-up.  MDebbora Dus PharmD Clinical Pharmacist [phone]

## 2021-07-05 NOTE — Patient Instructions (Signed)
July 05, 2021  Dear Mark Padilla,  It was a pleasure meeting you during our initial appointment on July 05, 2021. Below is a summary of the goals we discussed and components of chronic care management. Please contact me anytime with questions or concerns.   Visit Information   Patient Care Plan: CCM Pharmacy Care Plan     Problem Identified: CHL AMB "PATIENT-SPECIFIC PROBLEM"      Long-Range Goal: Disease Management   Start Date: 07/05/2021  Priority: High  Note:   Current Barriers:  None identified  Pharmacist Clinical Goal(s):  Patient will contact provider office for questions/concerns as evidenced notation of same in electronic health record through collaboration with PharmD and provider.   Interventions: 1:1 collaboration with Venia Carbon, MD regarding development and update of comprehensive plan of care as evidenced by provider attestation and co-signature Inter-disciplinary care team collaboration (see longitudinal plan of care) Comprehensive medication review performed; medication list updated in electronic medical record  Hypertension (BP goal <140/90) -Controlled - per clinic readings -Current treatment: HCTZ 25 mg - 1 tablet daily -Medications previously tried: none   -Current home readings: he checks it couple times/week - usually runs 120-124/68-70 -Current dietary habits: none -Current exercise habits: walks most days at [facility] - 2-3 miles today, gardens  -Denies hypotensive/hypertensive symptoms; If he stands up too quickly after activity, imbalanced. Reports a few months ago he fell bringing in groceries, no injuries. No falls since then. -Educated on BP goals and benefits of medications for prevention of heart attack, stroke and kidney damage; -Counseled to monitor BP at home 2-3 times/week, document, and provide log at future appointments -Recommended to continue current medication  Hyperlipidemia: (LDL goal < 100) -Controlled - per last LDL  within goal -Current treatment: Atorvastatin 10 mg daily -Medications previously tried: Simvastatin -Educated on Cholesterol goals;  -Recommended to continue current medication  Atrial Fibrillation (Goal: prevent stroke and major bleeding) -Controlled - followed by cardiology, no symptoms  -Current treatment: Rate control: none Anticoagulation: Warfarin 5 mg - as directed (1 daily and 1 and 1/2 Monday and Friday) -Medications previously tried: none reported -INR followed by [facility] -Home BP and HR readings: see HTN section, controlled  -Counseled on bleeding risk associated with NSAID and importance of self-monitoring for signs/symptoms of bleeding; avoidance of NSAIDs due to increased bleeding risk with anticoagulants; He is prescribed Meloxicam 7.5 mg - 1 tablet daily for arthritis. He is aware of risks. No s/s of bleeding today. He avoids all OTC NSAIDs. -Recommended to continue current medication  BPH (Goal: Symtpom control) -Controlled -  per patient report  -Current treatment  Tamsulosin 0.4 mg - 1 capsule daily -Medications previously tried: none -No nocturia, no daytime symptoms   -Recommended to continue current medication  Mild Mood Disorder (Goal: Pt reported - Improve sleep) -Controlled -  per patient report  -Current treatment  Xanax - takes 1 at bedtime -Medications previously tried: none -Cannot sleep without it, takes 1 every night. No next day grogginess. Feels well rested next day. -Recommended to continue current medication  GERD (Goal: Improve symptoms) -Controlled -  per patient report -Current treatment  Omeprazole 40 mg - 1 capsule daily (bedtime) -Medications previously tried: none -Symptoms most controlled. Avoids trigger foods. -Recommended to continue current medication  Allergies (Goal: Improve symptoms) -Controlled -  per patient report -States he has year-around allergies, it does worsen in Spring. Prior saw allergy specialist. Currently managed  by Dr. Silvio Pate. Denies SOB.  -Current treatment  Loratadine 10 mg - 1 tablet daily Montelukast 10 mg - 1 tablet at bedtime  Flonase - 2 sprays on nostrils in morning Azestaline 137 mcg - Instill 2 sprays in both nostrils at bedtime -Medications previously tried: none -Symptoms most controlled. Avoids trigger foods. -Recommended to continue current medication  GERD (Goal: Improve symptoms) -Controlled -  per patient report -Current treatment  Omeprazole 40 mg - 1 capsule daily (bedtime) -Medications previously tried: none -Symptoms most controlled. Avoids trigger foods. -Recommended to continue current medication  OTC -  Spectravite Multivitamin - 1 daily Potassium Gluconate 595 mg - 1 daily Vitamin C 500 mg - 1 daily Gabapentin 300 mg - 1 TID (pt wants to get off - we discussed taper strategies) Meloxicam 7.5 mg - 1 tablet daily  Patient Goals/Self-Care Activities Patient will:  - take medications as prescribed - contact CCM pharmacist for any health concerns  Follow Up Plan: Telephone follow up appointment with care management team member scheduled for:  12 months -CMA contact in 6 months for HTN review     Mark Padilla was given information about Chronic Care Management services today including:  CCM service includes personalized support from designated clinical staff supervised by his physician, including individualized plan of care and coordination with other care providers 24/7 contact phone numbers for assistance for urgent and routine care needs. Standard insurance, coinsurance, copays and deductibles apply for chronic care management only during months in which we provide at least 20 minutes of these services. Most insurances cover these services at 100%, however patients may be responsible for any copay, coinsurance and/or deductible if applicable. This service may help you avoid the need for more expensive face-to-face services. Only one practitioner may furnish and bill  the service in a calendar month. The patient may stop CCM services at any time (effective at the end of the month) by phone call to the office staff.  Patient agreed to services and verbal consent obtained.   The patient verbalized understanding of instructions, educational materials, and care plan provided today and declined offer to receive copy of patient instructions, educational materials, and care plan.   Debbora Dus, PharmD Clinical Pharmacist [facility] [phone]

## 2021-07-15 ENCOUNTER — Ambulatory Visit: Payer: Medicare Other

## 2021-07-16 ENCOUNTER — Other Ambulatory Visit: Payer: Self-pay

## 2021-07-16 ENCOUNTER — Ambulatory Visit (INDEPENDENT_AMBULATORY_CARE_PROVIDER_SITE_OTHER): Payer: Medicare Other

## 2021-07-16 DIAGNOSIS — Z7901 Long term (current) use of anticoagulants: Secondary | ICD-10-CM | POA: Diagnosis not present

## 2021-07-16 LAB — POCT INR: INR: 2.8 (ref 2.0–3.0)

## 2021-07-16 NOTE — Patient Instructions (Addendum)
Pre visit review using our clinic review tool, if applicable. No additional management support is needed unless otherwise documented below in the visit note.  Continue taking '5mg'$  daily EXCEPT 7.5 on Mondays and Fridays.  Recheck in 6 weeks.

## 2021-07-24 ENCOUNTER — Other Ambulatory Visit: Payer: Self-pay | Admitting: Internal Medicine

## 2021-07-30 DIAGNOSIS — M5126 Other intervertebral disc displacement, lumbar region: Secondary | ICD-10-CM | POA: Diagnosis not present

## 2021-07-30 DIAGNOSIS — M4726 Other spondylosis with radiculopathy, lumbar region: Secondary | ICD-10-CM | POA: Diagnosis not present

## 2021-07-30 DIAGNOSIS — M5136 Other intervertebral disc degeneration, lumbar region: Secondary | ICD-10-CM | POA: Diagnosis not present

## 2021-07-30 DIAGNOSIS — M48062 Spinal stenosis, lumbar region with neurogenic claudication: Secondary | ICD-10-CM | POA: Diagnosis not present

## 2021-08-04 DIAGNOSIS — H04123 Dry eye syndrome of bilateral lacrimal glands: Secondary | ICD-10-CM | POA: Diagnosis not present

## 2021-08-04 DIAGNOSIS — H524 Presbyopia: Secondary | ICD-10-CM | POA: Diagnosis not present

## 2021-08-04 DIAGNOSIS — H43813 Vitreous degeneration, bilateral: Secondary | ICD-10-CM | POA: Diagnosis not present

## 2021-08-04 DIAGNOSIS — Z961 Presence of intraocular lens: Secondary | ICD-10-CM | POA: Diagnosis not present

## 2021-08-13 ENCOUNTER — Other Ambulatory Visit: Payer: Self-pay | Admitting: Internal Medicine

## 2021-08-13 DIAGNOSIS — Z7901 Long term (current) use of anticoagulants: Secondary | ICD-10-CM

## 2021-08-16 NOTE — Telephone Encounter (Signed)
Pt is compliant with coumadin management and PCP apts. Sent in refill for warfarin.

## 2021-08-27 ENCOUNTER — Ambulatory Visit (INDEPENDENT_AMBULATORY_CARE_PROVIDER_SITE_OTHER): Payer: Medicare Other

## 2021-08-27 ENCOUNTER — Ambulatory Visit: Payer: Medicare Other

## 2021-08-27 ENCOUNTER — Other Ambulatory Visit: Payer: Self-pay

## 2021-08-27 ENCOUNTER — Telehealth: Payer: Self-pay

## 2021-08-27 DIAGNOSIS — Z7901 Long term (current) use of anticoagulants: Secondary | ICD-10-CM | POA: Diagnosis not present

## 2021-08-27 DIAGNOSIS — Z5181 Encounter for therapeutic drug level monitoring: Secondary | ICD-10-CM

## 2021-08-27 DIAGNOSIS — I4821 Permanent atrial fibrillation: Secondary | ICD-10-CM

## 2021-08-27 DIAGNOSIS — Z23 Encounter for immunization: Secondary | ICD-10-CM

## 2021-08-27 LAB — POCT INR: INR: 4.8 — AB (ref 2.0–3.0)

## 2021-08-27 NOTE — Telephone Encounter (Signed)
Pt NS coumadin clinic apt this morning. LVM for pt to call to RS coumadin clinic apt

## 2021-08-27 NOTE — Patient Instructions (Addendum)
Pre visit review using our clinic review tool, if applicable. No additional management support is needed unless otherwise documented below in the visit note.  Hold dose tomorrow and Sunday and then change weekly dose to take 1 tablet daily. Recheck in 2 wks.

## 2021-08-27 NOTE — Telephone Encounter (Signed)
Pt has been RS 

## 2021-08-29 NOTE — Progress Notes (Signed)
Agree. Thanks

## 2021-09-03 ENCOUNTER — Other Ambulatory Visit: Payer: Self-pay | Admitting: Internal Medicine

## 2021-09-03 MED ORDER — ALPRAZOLAM 1 MG PO TABS: 1.0000 mg | ORAL_TABLET | Freq: Two times a day (BID) | ORAL | 0 refills | Status: DC | PRN

## 2021-09-03 NOTE — Telephone Encounter (Signed)
  Encourage patient to contact the pharmacy for refills or they can request refills through [facility]:  Please schedule appointment if longer than 1 year  NEXT APPOINTMENT DATE:  MEDICATION:ALPRAZolam (XANAX) 1 MG tablet Azelastine HCl 137 MCG/SPRAY SOLN   Is the patient out of medication?   PHARMACY:[facility]  [facility]) -    Let patient know to contact pharmacy at the end of the day to make sure medication is ready.  Please notify patient to allow 48-72 hours to process  CLINICAL FILLS OUT ALL BELOW:   LAST REFILL:  QTY:  REFILL DATE:    OTHER COMMENTS:    Okay for refill?  Please advise

## 2021-09-10 ENCOUNTER — Other Ambulatory Visit: Payer: Self-pay

## 2021-09-10 ENCOUNTER — Ambulatory Visit (INDEPENDENT_AMBULATORY_CARE_PROVIDER_SITE_OTHER): Payer: Medicare Other

## 2021-09-10 DIAGNOSIS — Z7901 Long term (current) use of anticoagulants: Secondary | ICD-10-CM

## 2021-09-10 LAB — POCT INR: INR: 2 (ref 2.0–3.0)

## 2021-09-10 NOTE — Patient Instructions (Addendum)
Pre visit review using our clinic review tool, if applicable. No additional management support is needed unless otherwise documented below in the visit note.  Continue 1 tablet daily except take 1 1/2 tablets on Mondays. Recheck in 6 wks.

## 2021-09-29 ENCOUNTER — Encounter: Payer: Self-pay | Admitting: Internal Medicine

## 2021-09-29 ENCOUNTER — Ambulatory Visit (INDEPENDENT_AMBULATORY_CARE_PROVIDER_SITE_OTHER): Payer: Medicare Other

## 2021-09-29 ENCOUNTER — Other Ambulatory Visit: Payer: Self-pay

## 2021-09-29 ENCOUNTER — Ambulatory Visit (INDEPENDENT_AMBULATORY_CARE_PROVIDER_SITE_OTHER): Payer: Medicare Other | Admitting: Internal Medicine

## 2021-09-29 VITALS — BP 120/84 | HR 64 | Temp 98.0°F | Ht 67.0 in | Wt 182.0 lb

## 2021-09-29 DIAGNOSIS — R1013 Epigastric pain: Secondary | ICD-10-CM | POA: Insufficient documentation

## 2021-09-29 DIAGNOSIS — R109 Unspecified abdominal pain: Secondary | ICD-10-CM | POA: Diagnosis not present

## 2021-09-29 NOTE — Assessment & Plan Note (Addendum)
With bloating and nausea Gallbladder work up negative last year---doesn't sound like that On PPI---doesn't sound like gastritis Moving bowels regularly---but need to consider obstipation  Could be gastroparesis--but not sure why at this point  Will check KUB--no obstruction or obvious stool impaction Will give low FODMAP info Trial simethicone Back to GI if persists

## 2021-09-29 NOTE — Patient Instructions (Signed)
Please try simethicone after meals to see if that helps. If your symptoms continue, I will contact Dr Allen Norris about seeing you again.

## 2021-09-29 NOTE — Progress Notes (Signed)
Subjective:    Patient ID: Mark Padilla, male    DOB: 1935/01/01, 86 y.o.   MRN: [id number]  HPI Here due to bloating and nausea This visit occurred during the SARS-CoV-2 public health emergency.  Safety protocols were in place, including screening questions prior to the visit, additional usage of staff PPE, and extensive cleaning of exam room while observing appropriate contact time as indicated for disinfecting solutions.   Did have colonoscopy and gallbladder scan 6/21 Now having epigastric symptoms 1 week ago--got "violently ill" with burping and nausea Didn't vomit--and finally eased up some The burping persisted for several days--then feeling fine again  Symptoms recurred [place] at [place] chicken, black eyed peas and green beans---then started having pain and burping again Had a bad night last night--uncomfortable and burping  No heartburn or dysphagia Takes the omeprazole at bedtime nightly  Current Outpatient Medications on File Prior to Visit  Medication Sig Dispense Refill   ALPRAZolam (XANAX) 1 MG tablet Take 1 tablet (1 mg total) by mouth 2 (two) times daily as needed. 180 tablet 0   Ascorbic Acid (VITAMIN C) 500 MG tablet Take 500 mg by mouth daily.     atorvastatin (LIPITOR) 10 MG tablet TAKE 1 TABLET BY MOUTH  DAILY 90 tablet 3   Azelastine HCl 137 MCG/SPRAY SOLN INSTILL 2 SPRAYS INTO BOTH  NOSTRILS AT BEDTIME. 30 mL 11   fluticasone (FLONASE) 50 MCG/ACT nasal spray USE 2 SPRAYS IN BOTH  NOSTRILS DAILY 48 g 3   gabapentin (NEURONTIN) 300 MG capsule TAKE 1 CAPSULE BY MOUTH 3  TIMES DAILY 270 capsule 3   hydrochlorothiazide (HYDRODIURIL) 25 MG tablet TAKE 1 TABLET BY MOUTH  DAILY 90 tablet 3   loratadine (CLARITIN) 10 MG tablet Take 1 tablet (10 mg total) by mouth daily. 90 tablet 3   meloxicam (MOBIC) 7.5 MG tablet TAKE 1 TABLET BY MOUTH  DAILY 90 tablet 3   montelukast (SINGULAIR) 10 MG tablet TAKE 1 TABLET BY MOUTH AT   BEDTIME 90 tablet 3   Multiple Vitamin (MULTIVITAMIN) capsule Take 1 capsule by mouth daily.     omeprazole (PRILOSEC) 40 MG capsule TAKE 1 CAPSULE BY MOUTH  DAILY 90 capsule 3   Potassium Gluconate 2.5 MEQ TABS Take by mouth.     tamsulosin (FLOMAX) 0.4 MG CAPS capsule TAKE 1 CAPSULE BY MOUTH  DAILY 90 capsule 3   warfarin (COUMADIN) 5 MG tablet TAKE 1 TABLET BY MOUTH  DAILY EXCEPT 1 AND 1/2  TABLETS ON MONDAYS AND  FRIDAYS OR AS DIRECTED BY  COUMADIN CLINIC 104 tablet 1   No current facility-administered medications on file prior to visit.    Allergies  Allergen Reactions   Simvastatin     Listlessness, fatigue   Mucinex D [Pseudoephedrine-Guaifenesin Er]    Pseudoephedrine     Other reaction(s): Unknown   Undecylenic Acid    Flexeril [Cyclobenzaprine Hcl] Anxiety   Metaxalone Anxiety   Skelaxin Anxiety    Past Medical History:  Diagnosis Date   Allergic rhinitis    Atrial fibrillation (HCC)    chronic persistent, failed DCCV x3 in 2000   BPH (benign prostatic hypertrophy)    Cervical disc disease    Diverticulitis    ED (erectile dysfunction)    GERD (gastroesophageal reflux disease)    Hyperlipidemia    Hypertension    Impaired fasting glucose    Lumbar disc disease    Osteoarthritis  Sleep disturbance     Past Surgical History:  Procedure Laterality Date   CATARACT EXTRACTION, BILATERAL  2014   COLONOSCOPY  2013   COLONOSCOPY WITH PROPOFOL N/A 04/28/2020   Procedure: COLONOSCOPY WITH PROPOFOL;  Surgeon: Lucilla Lame, MD;  Location: [facility] ENDOSCOPY;  Service: Endoscopy;  Laterality: N/A;   HEMIARTHROPLASTY SHOULDER FRACTURE  10/2004   left   JOINT REPLACEMENT  4/12   Left hip--Dr Jefm Viernes   KNEE ARTHROSCOPY  06/1998   right Jefm Dancy)   LUMBAR LAMINECTOMY  06/10/13   [facility]   REPLACEMENT UNICONDYLAR JOINT KNEE  8/12   Dr Jefm Guillermo   SHOULDER SURGERY  1950's   dislocation   SHOULDER SURGERY  12/2004   right   THR--left  4/12   Dr Jefm Cone    Family  History  Problem Relation Age of Onset   Stomach cancer Mother    Stroke Father    Colon polyps Brother    Colon cancer Neg Hx     Social History   Socioeconomic History   Marital status: Single    Spouse name: Not on file   Number of children: 2   Years of education: Not on file   Highest education level: Not on file  Occupational History   Occupation: retired, Industrial/product designer works part time [employer]: RETIRED  Tobacco Use   Smoking status: Former    Types: Cigarettes    Quit date: 11/21/1962    Years since quitting: 58.8   Smokeless tobacco: Never  Vaping Use   Vaping Use: Never used  Substance and Sexual Activity   Alcohol use: Yes    Alcohol/week: 0.0 standard drinks    Comment: occasional   Drug use: No   Sexual activity: Not on file  Other Topics Concern   Not on file  Social History Narrative   Plans to do living will   Will designate daughter [name] as health care POA.    Would accept resuscitation but no prolonged artificial ventilation   Would not want tube feeds if cognitively unaware   Social Determinants of Health   Financial Resource Strain: Low Risk    Difficulty of Paying Living Expenses: Not very hard  Food Insecurity: Not on file  Transportation Needs: Not on file  Physical Activity: Not on file  Stress: Not on file  Social Connections: Not on file  Intimate Partner Violence: Not on file   Review of Systems Bowels were lose after stopping pain med---now mostly formed Clearly not constipated--may go as much as 3 times a day Passes gas at times No sugarless candy or gum. No soft drinks    Objective:   Physical Exam Constitutional:      Appearance: Normal appearance.  Cardiovascular:     Rate and Rhythm: Normal rate and regular rhythm.     Heart sounds: No murmur heard.   No gallop.  Pulmonary:     Effort: Pulmonary effort is normal.     Breath sounds: Normal breath sounds. No wheezing or rales.  Abdominal:      Palpations: Abdomen is soft. There is no mass.     Tenderness: There is no abdominal tenderness. There is no guarding or rebound.  Musculoskeletal:     Cervical back: Neck supple.  Lymphadenopathy:     Cervical: No cervical adenopathy.  Neurological:     Mental Status: He is alert.           Assessment & Plan:

## 2021-10-12 ENCOUNTER — Telehealth: Payer: Self-pay

## 2021-10-12 NOTE — Telephone Encounter (Deleted)
Rx sent electronically.  

## 2021-10-12 NOTE — Telephone Encounter (Signed)
Pt has been taking simethicone. When he has gas, he has loose stools. Afraid to leave home due to loose stools. He wants to stop that for now. Asking what is the next step.Has seen Dr Allen Norris in the past. He is aware it will be next week before anything is started.

## 2021-10-12 NOTE — Telephone Encounter (Signed)
Spoke to pt. Getting from [facility].

## 2021-10-13 NOTE — Telephone Encounter (Addendum)
He had a visit here 09-29-21 and was advised to try simethicone and it is not working He was told to call back if that did not help and he would be referred to Dr Allen Norris.

## 2021-10-13 NOTE — Telephone Encounter (Signed)
Spoke to pt. He is doing better today off of the simethicone. He will let us know if he does not hear back from them.

## 2021-10-22 ENCOUNTER — Ambulatory Visit (INDEPENDENT_AMBULATORY_CARE_PROVIDER_SITE_OTHER): Payer: Medicare Other

## 2021-10-22 ENCOUNTER — Other Ambulatory Visit: Payer: Self-pay

## 2021-10-22 DIAGNOSIS — Z7901 Long term (current) use of anticoagulants: Secondary | ICD-10-CM

## 2021-10-22 LAB — POCT INR: INR: 2.2 (ref 2.0–3.0)

## 2021-10-22 NOTE — Patient Instructions (Addendum)
Pre visit review using our clinic review tool, if applicable. No additional management support is needed unless otherwise documented below in the visit note.  Continue 1 tablet daily except take 1 1/2 tablets on Mondays. Recheck in 6 wks

## 2021-10-22 NOTE — Progress Notes (Signed)
Continue 1 tablet daily except take 1 1/2 tablets on Mondays. Recheck in 6 wks

## 2021-11-03 ENCOUNTER — Ambulatory Visit (INDEPENDENT_AMBULATORY_CARE_PROVIDER_SITE_OTHER): Payer: Medicare Other | Admitting: Gastroenterology

## 2021-11-03 ENCOUNTER — Encounter: Payer: Self-pay | Admitting: Gastroenterology

## 2021-11-03 ENCOUNTER — Other Ambulatory Visit: Payer: Self-pay

## 2021-11-03 VITALS — BP 134/75 | HR 85 | Temp 97.5°F | Ht 67.0 in | Wt 183.6 lb

## 2021-11-03 DIAGNOSIS — R197 Diarrhea, unspecified: Secondary | ICD-10-CM | POA: Diagnosis not present

## 2021-11-03 DIAGNOSIS — R1013 Epigastric pain: Secondary | ICD-10-CM

## 2021-11-03 NOTE — Progress Notes (Signed)
Primary Care Physician: Venia Carbon, MD  Primary Gastroenterologist:  Dr. Lucilla Lame  Chief Complaint  Patient presents with   Follow up abdominal pain    HPI: Mark Padilla is a 86 y.o. male here with report of bloating and abdominal pain.  The patient had seen me for intermittent abdominal pain and diarrhea back in 2021.  That time the patient had a colonoscopy with random biopsies that did not show any sign of microscopic colitis. The patient also underwent evaluation of his gallbladder with a gallbladder ejection fraction being normal at 50% with the lower limit of normal being 33%. The patient was recently seen by his primary care provider reporting that he continues to have GI issues. He reported to his primary care provider back on November 9 that he was having epigastric symptoms with what he reported to being violently ill with burping and nausea.  He denied any vomiting at that time and he reported that it finally eased up some.  The burping continued for several days after the acute attack and then he went back to feeling fine again.  The patient is not sure reports triggering his symptoms of dyspepsia with burping and abdominal pain.  He also reports that he has had episodes of diarrhea and he thinks it may be something he ate.  He is not sure if the burping and excessive gas was from something he drank since it happened shortly after he had a couple of mixed drinks with his friends while at the [facility]  He denies any recurrence of this and denies any of abdominal pain at the present time fevers chills nausea or vomiting.   Past Medical History:  Diagnosis Date   Allergic rhinitis    Atrial fibrillation (HCC)    chronic persistent, failed DCCV x3 in 2000   BPH (benign prostatic hypertrophy)    Cervical disc disease    Diverticulitis    ED (erectile dysfunction)    GERD (gastroesophageal reflux disease)    Hyperlipidemia    Hypertension    Impaired fasting glucose     Lumbar disc disease    Osteoarthritis    Sleep disturbance     Current Outpatient Medications  Medication Sig Dispense Refill   ALPRAZolam (XANAX) 1 MG tablet Take 1 tablet (1 mg total) by mouth 2 (two) times daily as needed. 180 tablet 0   Ascorbic Acid (VITAMIN C) 500 MG tablet Take 500 mg by mouth daily.     atorvastatin (LIPITOR) 10 MG tablet TAKE 1 TABLET BY MOUTH  DAILY 90 tablet 3   Azelastine HCl 137 MCG/SPRAY SOLN INSTILL 2 SPRAYS INTO BOTH  NOSTRILS AT BEDTIME. 30 mL 11   fluticasone (FLONASE) 50 MCG/ACT nasal spray USE 2 SPRAYS IN BOTH  NOSTRILS DAILY 48 g 3   gabapentin (NEURONTIN) 300 MG capsule TAKE 1 CAPSULE BY MOUTH 3  TIMES DAILY 270 capsule 3   hydrochlorothiazide (HYDRODIURIL) 25 MG tablet TAKE 1 TABLET BY MOUTH  DAILY 90 tablet 3   loratadine (CLARITIN) 10 MG tablet Take 1 tablet (10 mg total) by mouth daily. 90 tablet 3   meloxicam (MOBIC) 7.5 MG tablet TAKE 1 TABLET BY MOUTH  DAILY 90 tablet 3   montelukast (SINGULAIR) 10 MG tablet TAKE 1 TABLET BY MOUTH AT  BEDTIME 90 tablet 3   Multiple Vitamin (MULTIVITAMIN) capsule Take 1 capsule by mouth daily.     omeprazole (PRILOSEC) 40 MG capsule TAKE 1 CAPSULE BY MOUTH  DAILY  90 capsule 3   Potassium Gluconate 2.5 MEQ TABS Take by mouth.     tamsulosin (FLOMAX) 0.4 MG CAPS capsule TAKE 1 CAPSULE BY MOUTH  DAILY 90 capsule 3   warfarin (COUMADIN) 5 MG tablet TAKE 1 TABLET BY MOUTH  DAILY EXCEPT 1 AND 1/2  TABLETS ON MONDAYS AND  FRIDAYS OR AS DIRECTED BY  COUMADIN CLINIC 104 tablet 1   No current facility-administered medications for this visit.    Allergies as of 11/03/2021 - Review Complete 11/03/2021  Allergen Reaction Noted   Simvastatin  05/07/2012   Mucinex d [pseudoephedrine-guaifenesin er]  11/08/2013   Pseudoephedrine  11/08/2013   Undecylenic acid  11/08/2013   Flexeril [cyclobenzaprine hcl] Anxiety 02/18/2011   Metaxalone Anxiety 02/18/2011   Skelaxin Anxiety 02/18/2011    ROS:  General: Negative for  anorexia, weight loss, fever, chills, fatigue, weakness. ENT: Negative for hoarseness, difficulty swallowing , nasal congestion. CV: Negative for chest pain, angina, palpitations, dyspnea on exertion, peripheral edema.  Respiratory: Negative for dyspnea at rest, dyspnea on exertion, cough, sputum, wheezing.  GI: See history of present illness. GU:  Negative for dysuria, hematuria, urinary incontinence, urinary frequency, nocturnal urination.  Endo: Negative for unusual weight change.    Physical Examination:   BP 134/75 (BP Location: Left Arm, Patient Position: Sitting, Cuff Size: Large)    Pulse 85    Temp (!) 97.5 F (36.4 C) (Temporal)    Ht 5\' 7"  (1.702 m)    Wt 183 lb 9.6 oz (83.3 kg)    BMI 28.76 kg/m   General: Well-nourished, well-developed in no acute distress.  Eyes: No icterus. Conjunctivae pink. Neuro: Alert and oriented x 3.  Grossly intact. Skin: Warm and dry, no jaundice.   Psych: Alert and cooperative, normal mood and affect.  Labs:    Imaging Studies: No results found.  Assessment and Plan:   Mark Padilla is a 86 y.o. y/o male who comes in today with a history of attacks with diarrhea intermittently and infrequently and a episode at the [facility] with severe abdominal pain with burping and gas. In between these episodes the patient is not having any symptoms and feels fine.  The patient has been told that his workup in the past has been negative and since this happens only on occasion it is likely something environmental that is doing and not some chronic disease that happens so intermittently.  The patient was also explained that he should try and monitor what he ate or drank and the amount of air he is swallowing next time he has this attack and has been asked to try and avoid swallowing air during these attacks thereby causing him to burp. It is unlikely that any further GI workup would be informative of the cause of his symptoms. The patient has been explained the plan  and agrees with it.     Lucilla Lame, MD. Marval Regal    Note: This dictation was prepared with Dragon dictation along with smaller phrase technology. Any transcriptional errors that result from this process are unintentional.

## 2021-11-23 IMAGING — MR MR LUMBAR SPINE W/O CM
4 of 5 series · 33 of 48 positions shown · non-contrast
Comparison: 01/08/2013

CLINICAL DATA: Surgery 8 years ago with approximately 75% relief.
One year of worsening left greater than right low back and gluteal
pain

EXAM:
MRI LUMBAR SPINE WITHOUT CONTRAST
TECHNIQUE: Multiplanar, multisequence MR imaging of the lumbar spine was
performed. No intravenous contrast was administered.

[Series 5: T2 · sagittal · 4.0mm · 0.81mm/px · 8 of 17 slices shown (1 of 2)]
[im 1/17]
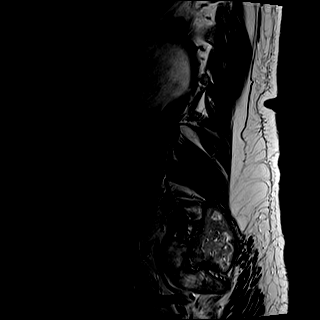
[im 3/17]
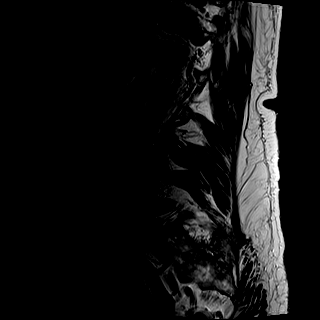
[im 5/17]
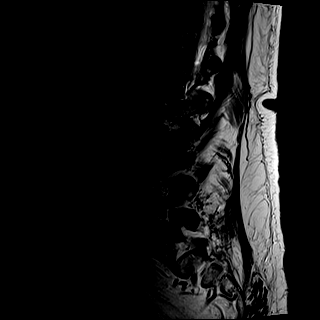
[im 7/17]
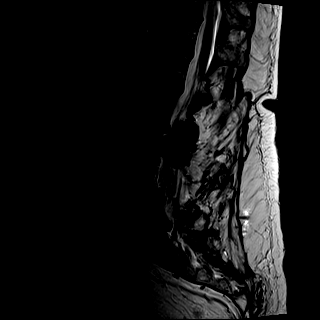
[im 10/17]
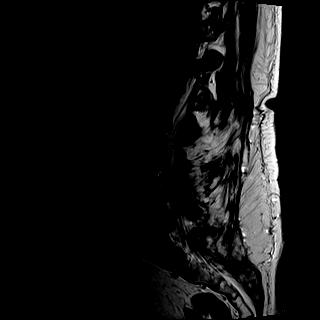
[im 12/17]
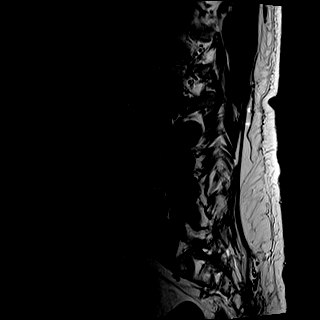
[im 14/17]
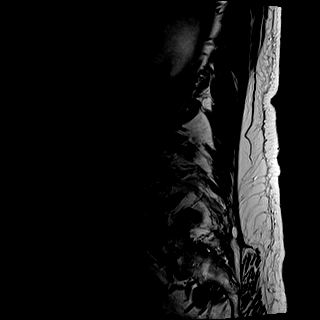
[im 17/17]
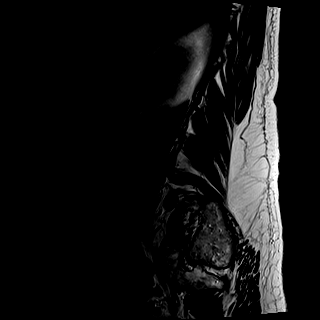

[Series 6: T1 · sagittal · 4.0mm · 0.81mm/px · 7 of 17 slices shown (1 of 2)]
[im 1/17]
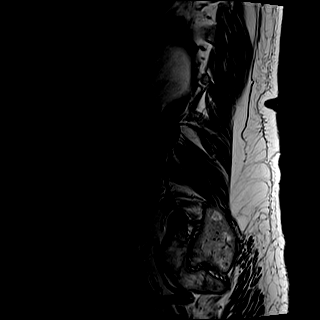
[im 3/17]
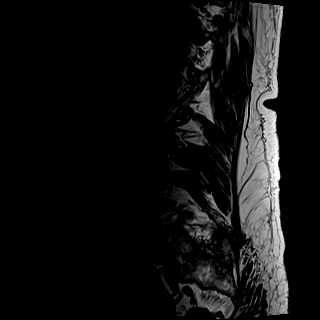
[im 6/17]
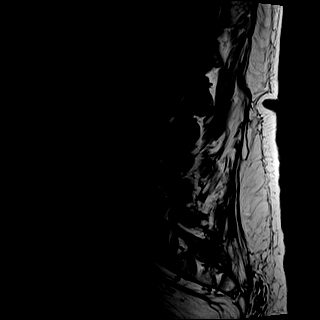
[im 9/17]
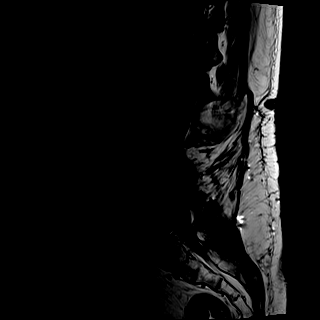
[im 11/17]
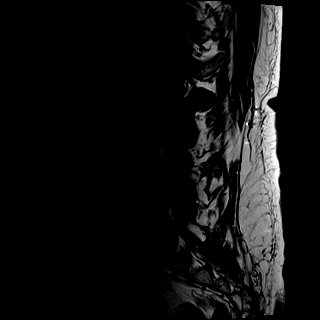
[im 14/17]
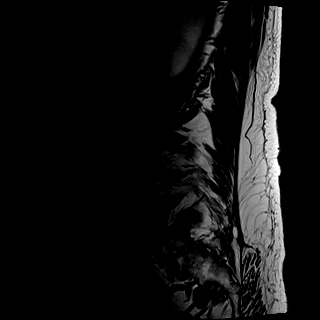
[im 17/17]
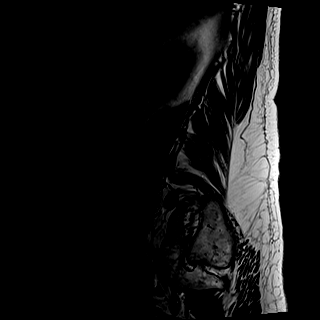

[Series 8: T2 · axial · 4.0mm · 0.78mm/px · z∈[-117,+87]mm · 9 of 32 slices shown (2 of 2)]
[im 1/32]
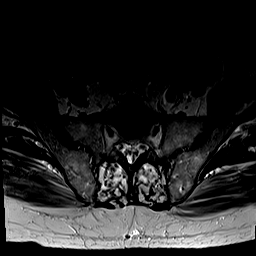
[im 6/32]
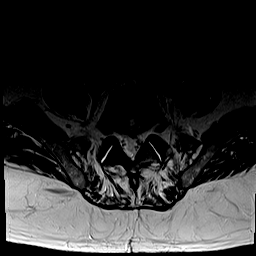
[im 11/32]
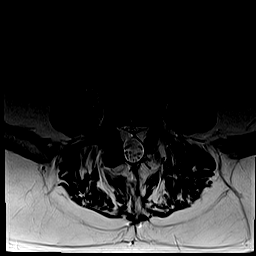
[im 13/32]
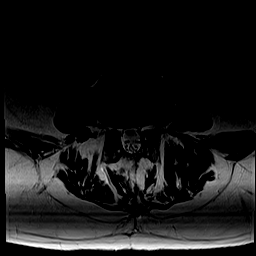
[im 16/32]
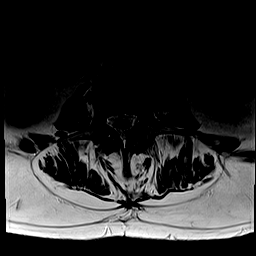
[im 19/32]
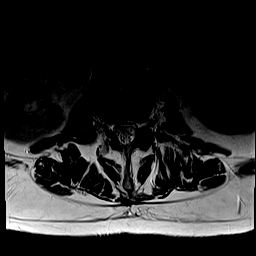
[im 21/32]
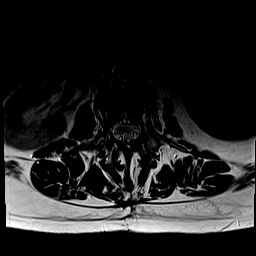
[im 26/32]
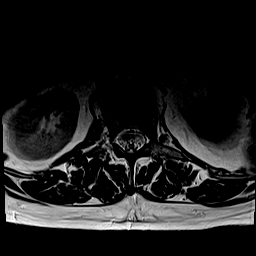
[im 32/32]
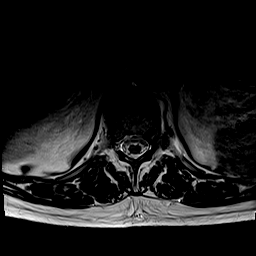

[Series 9: T1 · axial · 4.0mm · 0.39mm/px · z∈[-117,+87]mm · 9 of 32 slices shown (2 of 2)]
[im 1/32]
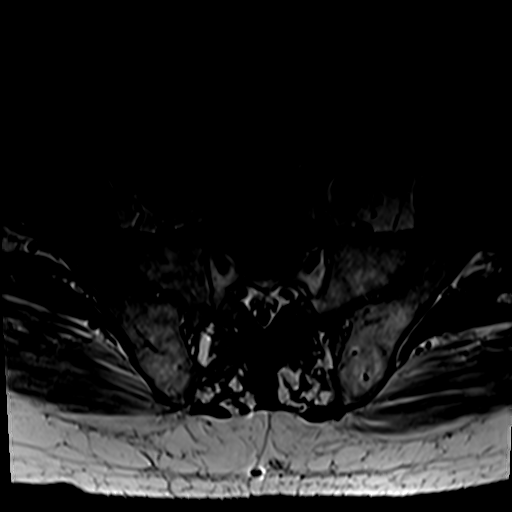
[im 6/32]
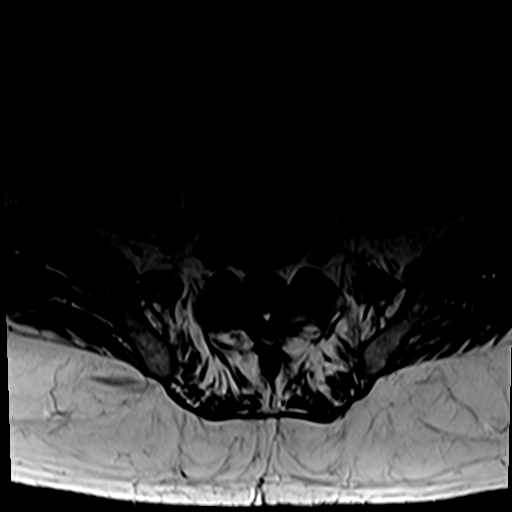
[im 11/32]
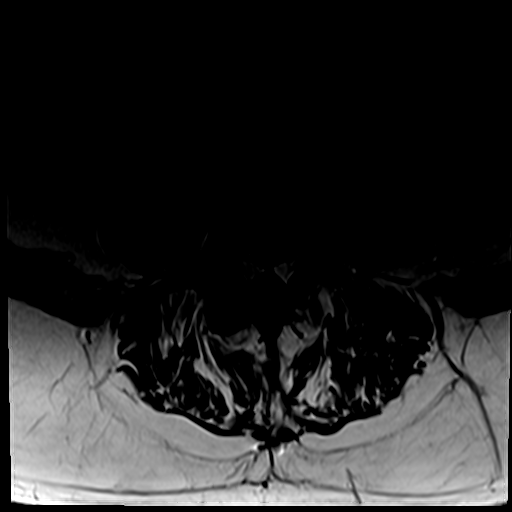
[im 13/32]
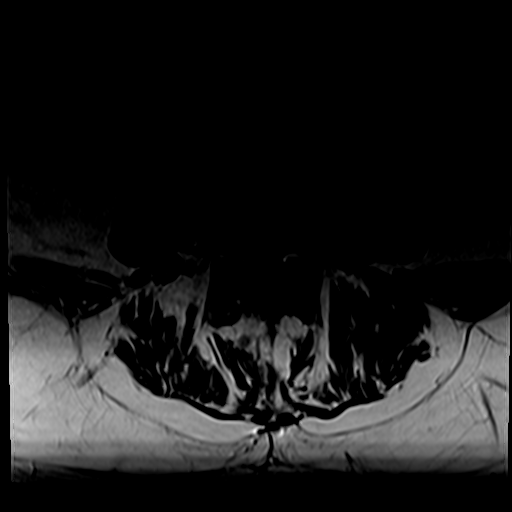
[im 16/32]
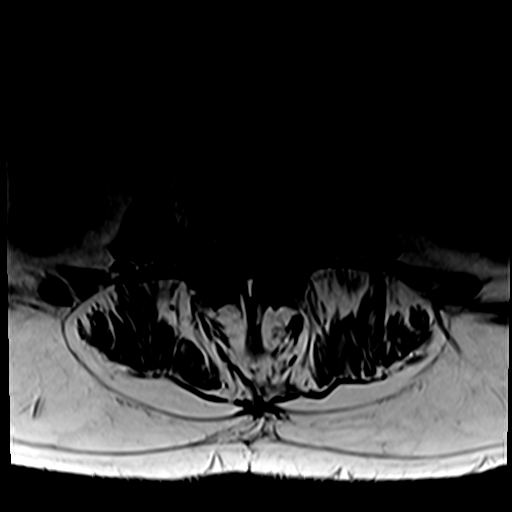
[im 19/32]
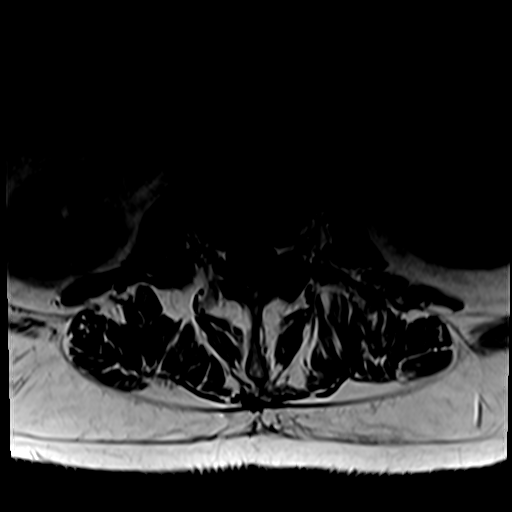
[im 21/32]
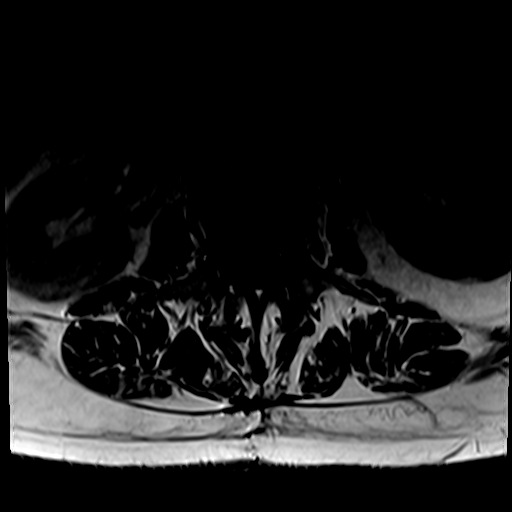
[im 26/32]
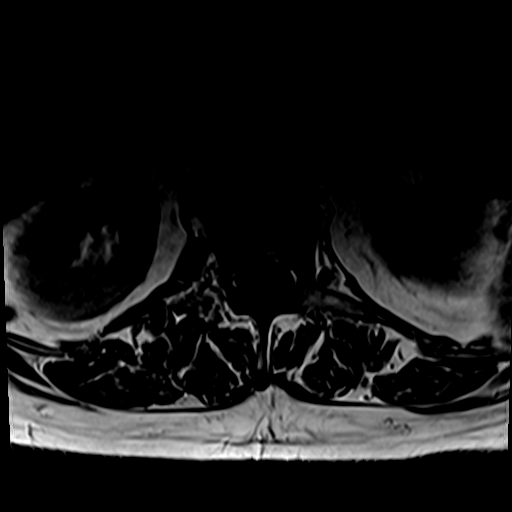
[im 32/32]
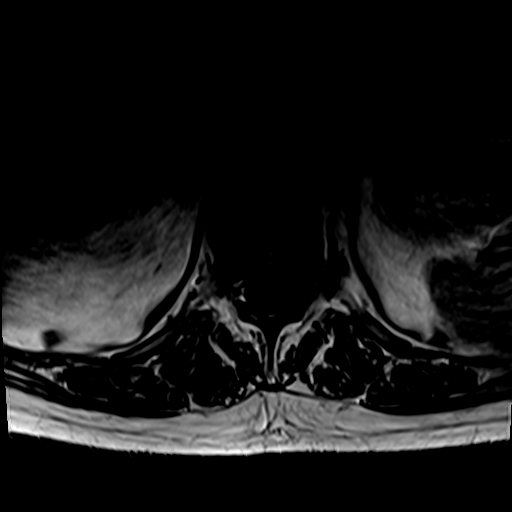

[33 of 48 positions shown; findings below may reference images not displayed]

FINDINGS: Segmentation:  5 lumbar type vertebrae

Alignment: Mild L2-3. Grade 1 L4-5 anterolisthesis. And L5-S1
retrolisthesis

Vertebrae: Discogenic endplate edema at L2-3. No evidence of
fracture or bone lesion.

Conus medullaris and cauda equina: Conus extends to the L1 level.
Conus and cauda equina appear normal.

Paraspinal and other soft tissues: Diffuse fatty atrophy of
intrinsic back muscles. Partially covered left renal cystic
intensity.

Disc levels:

T12- L1: Disc narrowing and bulging with ventral spondylitic
spurring

L1-L2: Disc narrowing and bulging

L2-L3: Disc collapse and endplate degeneration eccentric to the
left. Disc bulging and endplate spurring. Degenerative facet
spurring on both sides. Mild spinal and left foraminal stenosis.

L3-L4: Disc collapse and facet spurring. Patent canal after
laminectomy. Patent foramina

L4-L5: Disc collapse with mild bulging. Bilateral degenerative facet
spurring. The canal and foramina are patent

L5-S1:Disc narrowing and bulging with right paracentral protrusion
contacting the right S1 nerve root. Degenerative facet spurring
that is mild. Prior posterior decompression. No neural compression.
IMPRESSION: 1. Generalized progression of lumbar spine degeneration with
multilevel listhesis. Discogenic endplate edema is present at L2-3.
2. L5-S1 right paracentral protrusion impinging on the right S1
nerve root.
3. Widely patent spinal canal at the levels of prior laminectomy.

## 2021-12-03 ENCOUNTER — Other Ambulatory Visit: Payer: Self-pay

## 2021-12-03 ENCOUNTER — Ambulatory Visit (INDEPENDENT_AMBULATORY_CARE_PROVIDER_SITE_OTHER): Payer: Medicare Other

## 2021-12-03 DIAGNOSIS — Z7901 Long term (current) use of anticoagulants: Secondary | ICD-10-CM

## 2021-12-03 DIAGNOSIS — I4821 Permanent atrial fibrillation: Secondary | ICD-10-CM

## 2021-12-03 DIAGNOSIS — Z5181 Encounter for therapeutic drug level monitoring: Secondary | ICD-10-CM

## 2021-12-03 LAB — POCT INR: INR: 3.6 — AB (ref 2.0–3.0)

## 2021-12-03 NOTE — Progress Notes (Addendum)
Pt reports celebrating his birthday with drinking alcohol every night this week. So, no changes to his weekly dose will be made because pt has been consistent on this dose in the past and he is done celebrating his birthday and will cut back on alcohol. Hold dose today and continue 1 tablet daily except take 1 1/2 tablets on Mondays. Recheck in 6 wks per pt request due to being out of town.

## 2021-12-03 NOTE — Patient Instructions (Addendum)
Pre visit review using our clinic review tool, if applicable. No additional management support is needed unless otherwise documented below in the visit note.  Hold dose today and continue 1 tablet daily except take 1 1/2 tablets on Mondays. Recheck in 6 wks

## 2022-01-06 ENCOUNTER — Other Ambulatory Visit: Payer: Self-pay | Admitting: Internal Medicine

## 2022-01-06 DIAGNOSIS — Z7901 Long term (current) use of anticoagulants: Secondary | ICD-10-CM

## 2022-01-07 NOTE — Telephone Encounter (Signed)
Pt is compliant with coumadin management and PCP apts.  Sent in refill.

## 2022-01-14 ENCOUNTER — Ambulatory Visit (INDEPENDENT_AMBULATORY_CARE_PROVIDER_SITE_OTHER): Payer: Medicare Other

## 2022-01-14 ENCOUNTER — Other Ambulatory Visit: Payer: Self-pay

## 2022-01-14 DIAGNOSIS — Z7901 Long term (current) use of anticoagulants: Secondary | ICD-10-CM | POA: Diagnosis not present

## 2022-01-14 LAB — POCT INR: INR: 2.5 (ref 2.0–3.0)

## 2022-01-14 NOTE — Telephone Encounter (Signed)
Fax for azelastine spray from [facility]. I need to f/u with pt as he was using a GoodRx card for the previous rx.

## 2022-01-14 NOTE — Progress Notes (Signed)
Continue 1 tablet daily except take 1 1/2 tablets on Mondays. Recheck in  6 weeks.  

## 2022-01-14 NOTE — Patient Instructions (Addendum)
Pre visit review using our clinic review tool, if applicable. No additional management support is needed unless otherwise documented below in the visit note.  Continue 1 tablet daily except take 1 1/2 tablets on Mondays. Recheck in  6 weeks.  

## 2022-01-17 MED ORDER — AZELASTINE HCL 137 MCG/SPRAY NA SOLN: NASAL | 3 refills | Status: DC

## 2022-01-17 NOTE — Telephone Encounter (Signed)
Spoke to pt. Said to send to [facility].

## 2022-01-31 ENCOUNTER — Other Ambulatory Visit: Payer: Self-pay | Admitting: Internal Medicine

## 2022-02-03 ENCOUNTER — Telehealth: Payer: Self-pay

## 2022-02-03 NOTE — Chronic Care Management (AMB) (Signed)
? ? ?Chronic Care Management ?Pharmacy Assistant  ? ?Name: Mark Padilla  MRN: [id number] DOB: 1934-12-15 ? ?Reason for Encounter:Hypertension  Disease State ?  ? ?Recent office visits:  ?09/29/21-PCP-Richard Letvak,MD-Epigastric pain-Please try simethicone after meals to see if that helps.Xray abdomen-Contact Dr.Wohl if symptoms continue ? ? ?Recent consult visits:  ?11/03/21-Gasteroenterology-Darren Whol,MD- follow up diarrhea-No medication changes ? ?Hospital visits:  ?None in previous 6 months ? ?Medications: ?Outpatient Encounter Medications as of 02/03/2022  ?Medication Sig  ? ALPRAZolam (XANAX) 1 MG tablet TAKE 1 TABLET BY MOUTH  TWICE DAILY AS NEEDED  ? Ascorbic Acid (VITAMIN C) 500 MG tablet Take 500 mg by mouth daily.  ? atorvastatin (LIPITOR) 10 MG tablet TAKE 1 TABLET BY MOUTH  DAILY  ? Azelastine HCl 137 MCG/SPRAY SOLN INSTILL 2 SPRAYS INTO BOTH  NOSTRILS AT BEDTIME.  ? fluticasone (FLONASE) 50 MCG/ACT nasal spray USE 2 SPRAYS IN BOTH  NOSTRILS DAILY  ? gabapentin (NEURONTIN) 300 MG capsule TAKE 1 CAPSULE BY MOUTH 3  TIMES DAILY  ? hydrochlorothiazide (HYDRODIURIL) 25 MG tablet TAKE 1 TABLET BY MOUTH  DAILY  ? loratadine (CLARITIN) 10 MG tablet Take 1 tablet (10 mg total) by mouth daily.  ? meloxicam (MOBIC) 7.5 MG tablet TAKE 1 TABLET BY MOUTH  DAILY  ? montelukast (SINGULAIR) 10 MG tablet TAKE 1 TABLET BY MOUTH AT  BEDTIME  ? Multiple Vitamin (MULTIVITAMIN) capsule Take 1 capsule by mouth daily.  ? omeprazole (PRILOSEC) 40 MG capsule TAKE 1 CAPSULE BY MOUTH  DAILY  ? Potassium Gluconate 2.5 MEQ TABS Take by mouth.  ? tamsulosin (FLOMAX) 0.4 MG CAPS capsule TAKE 1 CAPSULE BY MOUTH  DAILY  ? warfarin (COUMADIN) 5 MG tablet TAKE 1 TABLET BY MOUTH  DAILY EXCEPT 1 AND 1/2  TABLETS BY MOUTH ON MONDAYS OR AS DIRECTED  BY COUMADIN CLINIC  ? ?No facility-administered encounter medications on file as of 02/03/2022.  ? ? ?Recent Office Vitals: ?BP Readings from Last 3 Encounters:  ?11/03/21 134/75  ?09/29/21  120/84  ?03/02/21 124/74  ? ?Pulse Readings from Last 3 Encounters:  ?11/03/21 85  ?09/29/21 64  ?03/02/21 (!) 57  ?  ?Wt Readings from Last 3 Encounters:  ?11/03/21 183 lb 9.6 oz (83.3 kg)  ?09/29/21 182 lb (82.6 kg)  ?03/02/21 181 lb (82.1 kg)  ?  ? ?Kidney Function ?Lab Results  ?Component Value Date/Time  ? CREATININE 0.78 03/02/2021 10:00 AM  ? CREATININE 0.79 02/25/2020 10:32 AM  ? CREATININE 1.25 01/20/2014 04:11 PM  ? GFR 82.04 03/02/2021 10:00 AM  ? GFRNONAA 55 (L) 01/20/2014 04:11 PM  ? GFRAA >60 01/20/2014 04:11 PM  ? ? ?BMP Latest Ref Rng & Units 03/02/2021 02/25/2020 01/29/2019  ?Glucose 70 - 99 mg/dL 89 100(H) 92  ?BUN 6 - 23 mg/dL '9 15 18  '$ ?Creatinine 0.40 - 1.50 mg/dL 0.78 0.79 0.88  ?Sodium 135 - 145 mEq/L 136 133(L) 133(L)  ?Potassium 3.5 - 5.1 mEq/L 4.1 4.4 4.0  ?Chloride 96 - 112 mEq/L 97 97 97  ?CO2 19 - 32 mEq/L '29 29 29  '$ ?Calcium 8.4 - 10.5 mg/dL 9.6 9.6 9.3  ? ? ? ?Contacted patient on 02/08/22 to discuss hypertension disease state ? ?Current antihypertensive regimen:  ?HCTZ 25 mg - 1 tablet daily ? Patient verbally confirms he is taking the above medications as directed. Yes ? ?How often are you checking your Blood Pressure?  Patient  reports he does not need to check BP  ? ?Current home BP readings:  none available  ? ?Salt intake:  limits with adding to foods ?Over the counter medications including pseudoephedrine or NSAIDs? Takes Claritin for allergies  ? ?Any readings above 180/120? No ? ? ?What recent interventions/DTPs have been made by any provider to improve Blood Pressure control since last CPP Visit:  none identified. The patient reports he has been on HCTZ for > 40 years. Patient does not feel he needs to monitor BP at home  ? ?Any recent hospitalizations or ED visits since last visit with CPP? No ? ?What diet changes have been made to improve Blood Pressure Control?  ?    Low sodium diet  ? ?What exercise is being done to improve your Blood Pressure Control?  ? [facility]    4-5 times a week  ? ?Adherence Review: ?Is the patient currently on ACE/ARB medication? No ?Does the patient have >5 day gap between last estimated fill dates? No ? ? ?Star Rating Drugs:  ?Medication:  Last Fill: Day Supply ?Atorvastatin '10mg'$  01/31/22 90 ? ? ? ?Care Gaps: ?Annual wellness visit in last year? Yes ?Most Recent BP reading:120/84  64-P  09/29/21 ? ? ?Upcoming appointments: ?PCP appointment on 03/23/22 ? ?Charlene Brooke, CPP notified ? ?Mark Padilla, CCMA ?Health concierge  ?[phone]  ? ?

## 2022-02-23 ENCOUNTER — Other Ambulatory Visit: Payer: Self-pay | Admitting: Internal Medicine

## 2022-02-23 DIAGNOSIS — Z7901 Long term (current) use of anticoagulants: Secondary | ICD-10-CM

## 2022-02-24 ENCOUNTER — Ambulatory Visit (INDEPENDENT_AMBULATORY_CARE_PROVIDER_SITE_OTHER): Payer: Medicare Other

## 2022-02-24 DIAGNOSIS — I4821 Permanent atrial fibrillation: Secondary | ICD-10-CM

## 2022-02-24 DIAGNOSIS — Z7901 Long term (current) use of anticoagulants: Secondary | ICD-10-CM

## 2022-02-24 DIAGNOSIS — Z5181 Encounter for therapeutic drug level monitoring: Secondary | ICD-10-CM

## 2022-02-24 LAB — POCT INR: INR: 2.3 (ref 2.0–3.0)

## 2022-02-24 NOTE — Progress Notes (Addendum)
Continue 1 tablet daily except take 1 1/2 tablets on Mondays. Recheck in 7 weeks per pt request due to being out of town in 6 weeks.  ?

## 2022-02-24 NOTE — Patient Instructions (Addendum)
Pre visit review using our clinic review tool, if applicable. No additional management support is needed unless otherwise documented below in the visit note. ? ?Continue 1 tablet daily except take 1 1/2 tablets on Mondays. Recheck in 7 weeks. ?

## 2022-02-24 NOTE — Telephone Encounter (Signed)
Pt is compliant with warfarin management and PCP apts. Sent in refills 

## 2022-03-23 ENCOUNTER — Ambulatory Visit (INDEPENDENT_AMBULATORY_CARE_PROVIDER_SITE_OTHER): Payer: Medicare Other | Admitting: Internal Medicine

## 2022-03-23 ENCOUNTER — Encounter: Payer: Self-pay | Admitting: Internal Medicine

## 2022-03-23 VITALS — BP 134/84 | HR 62 | Temp 97.2°F | Ht 66.0 in | Wt 184.0 lb

## 2022-03-23 DIAGNOSIS — K219 Gastro-esophageal reflux disease without esophagitis: Secondary | ICD-10-CM | POA: Diagnosis not present

## 2022-03-23 DIAGNOSIS — N400 Enlarged prostate without lower urinary tract symptoms: Secondary | ICD-10-CM

## 2022-03-23 DIAGNOSIS — M5136 Other intervertebral disc degeneration, lumbar region: Secondary | ICD-10-CM

## 2022-03-23 DIAGNOSIS — Z0000 Encounter for general adult medical examination without abnormal findings: Secondary | ICD-10-CM | POA: Diagnosis not present

## 2022-03-23 DIAGNOSIS — I4819 Other persistent atrial fibrillation: Secondary | ICD-10-CM | POA: Diagnosis not present

## 2022-03-23 DIAGNOSIS — I10 Essential (primary) hypertension: Secondary | ICD-10-CM

## 2022-03-23 DIAGNOSIS — F39 Unspecified mood [affective] disorder: Secondary | ICD-10-CM

## 2022-03-23 LAB — COMPREHENSIVE METABOLIC PANEL
ALT: 23 U/L (ref 0–53)
AST: 33 U/L (ref 0–37)
Albumin: 4.4 g/dL (ref 3.5–5.2)
Alkaline Phosphatase: 82 U/L (ref 39–117)
BUN: 14 mg/dL (ref 6–23)
CO2: 31 mEq/L (ref 19–32)
Calcium: 9.4 mg/dL (ref 8.4–10.5)
Chloride: 98 mEq/L (ref 96–112)
Creatinine, Ser: 0.78 mg/dL (ref 0.40–1.50)
GFR: 81.43 mL/min (ref >60.00)
Glucose, Bld: 97 mg/dL (ref 70–99)
Potassium: 4 mEq/L (ref 3.5–5.1)
Sodium: 135 mEq/L (ref 135–145)
Total Bilirubin: 0.6 mg/dL (ref 0.2–1.2)
Total Protein: 7.3 g/dL (ref 6.0–8.3)

## 2022-03-23 LAB — LIPID PANEL
Cholesterol: 143 mg/dL (ref 0–200)
HDL: 53.4 mg/dL (ref >39.00)
LDL Cholesterol: 71 mg/dL (ref 0–99)
NonHDL: 89.73
Total CHOL/HDL Ratio: 3
Triglycerides: 94 mg/dL (ref 0.0–149.0)
VLDL: 18.8 mg/dL (ref 0.0–40.0)

## 2022-03-23 LAB — CBC
HCT: 45.6 % (ref 39.0–52.0)
Hemoglobin: 15.2 g/dL (ref 13.0–17.0)
MCHC: 33.5 g/dL (ref 30.0–36.0)
MCV: 92.5 fl (ref 78.0–100.0)
Platelets: 225 10*3/uL (ref 150.0–400.0)
RBC: 4.93 Mil/uL (ref 4.22–5.81)
RDW: 13.3 % (ref 11.5–15.5)
WBC: 5.4 10*3/uL (ref 4.0–10.5)

## 2022-03-23 LAB — T4, FREE: Free T4: 0.83 ng/dL (ref 0.60–1.60)

## 2022-03-23 MED ORDER — GABAPENTIN 300 MG PO CAPS: 300.0000 mg | ORAL_CAPSULE | Freq: Every morning | ORAL | 0 refills | Status: DC

## 2022-03-23 MED ORDER — OXYCODONE-ACETAMINOPHEN 5-325 MG PO TABS: 1.0000 | ORAL_TABLET | Freq: Four times a day (QID) | ORAL | 0 refills | Status: AC | PRN

## 2022-03-23 NOTE — Assessment & Plan Note (Signed)
Quiet on omeprazole 40 ?Will continue due to symptoms on lower dose ?

## 2022-03-23 NOTE — Assessment & Plan Note (Signed)
I have personally reviewed the Medicare Annual Wellness questionnaire and have noted ?1. The patient's medical and social history ?2. Their use of alcohol, tobacco or illicit drugs ?3. Their current medications and supplements ?4. The patient's functional ability including ADL's, fall risks, home safety risks and hearing or visual ?            impairment. ?5. Diet and physical activities ?6. Evidence for depression or mood disorders ? ?The patients weight, height, BMI and visual acuity have been recorded in the chart ?I have made referrals, counseling and provided education to the patient based review of the above and I have provided the pt with a written personalized care plan for preventive services. ? ?I have provided you with a copy of your personalized plan for preventive services. Please take the time to review along with your updated medication list. ? ?No cancer screening due to age ?Discussed adding leg strengthening to his walking ?COVID booster and flu vaccines by the fall ?Td at the booster--and consider shingrix ?

## 2022-03-23 NOTE — Assessment & Plan Note (Signed)
Voids okay on tamsulosin daily ?

## 2022-03-23 NOTE — Assessment & Plan Note (Signed)
Rate is still fine ?Continues on coumadin per coag clinic ?

## 2022-03-23 NOTE — Progress Notes (Signed)
Hearing Screening - Comments:: Aware of hearing loss in left ear. Did not pass whisper test. ?Vision Screening - Comments:: Has appt in June 2023 ? ?

## 2022-03-23 NOTE — Assessment & Plan Note (Signed)
BP Readings from Last 3 Encounters:  ?03/23/22 134/84  ?11/03/21 134/75  ?09/29/21 120/84  ? ?Good control on HCTZ 25 ?Will check labs ?

## 2022-03-23 NOTE — Assessment & Plan Note (Signed)
Mild dysthymia and irritability at times ?Mostly uses the xanax to help sleep ?

## 2022-03-23 NOTE — Assessment & Plan Note (Signed)
With radicular pain ?On gabapentin 300/600 ?Will refill small quantity oxycodone--for emergency use ?

## 2022-03-23 NOTE — Progress Notes (Signed)
? ?Subjective:  ? ? Patient ID: Mark Padilla, male    DOB: 1934/12/10, 87 y.o.   MRN: [id number] ? ?HPI ?Here for Medicare wellness visit and follow up of chronic health conditions ?Reviewed advanced directives ?Reviewed other doctors----Dr Mark Padilla, Dr Mark Padilla--cardiology, Dr Mark Padilla, Dr Mark Padilla, Dr Mark Padilla ?No hospitalizations or surgical procedures this year ?Does go to the gym---limited resistance but walks ?No hearing on left---limited overall ?Vision is okay ?Occasional alcohol ?No tobacco ?Independent with instrumental ADLs ?No sig memory issues ? ?Not doing great ?Hard to get loose---fingers are stiff ?Hurts all over ?Still with pain from radiculopathy in back ?Had 3 sets of injections---then they got "burned" out----but the pain is starting to come back ?Not clear gabapentin is helping -- 300/600 ?Still on meloxicam daily ?Has tried TENS unit at times ---?some help ?Some troubles with balance--but does okay after loosening up ? ?Still gets irritated at times ?Some down times---in winter when stuck in. Nothing persistent ?Uses xanax at bedtime mostly-rare in the day ? ?No palpitations ?No chest pain or SOB ?No syncope---but has brief dizziness if he gets up quick ?No sig edema--occasionally notes if he stands all day fishing ? ?Voids okay ?Stream is good---but slow to empty at times ?Nocturia is rare ? ?Current Outpatient Medications on File Prior to Visit  ?Medication Sig Dispense Refill  ? ALPRAZolam (XANAX) 1 MG tablet TAKE 1 TABLET BY MOUTH  TWICE DAILY AS NEEDED 180 tablet 0  ? Ascorbic Acid (VITAMIN C) 500 MG tablet Take 500 mg by mouth daily.    ? atorvastatin (LIPITOR) 10 MG tablet TAKE 1 TABLET BY MOUTH  DAILY 90 tablet 3  ? Azelastine HCl 137 MCG/SPRAY SOLN INSTILL 2 SPRAYS INTO BOTH  NOSTRILS AT BEDTIME. 90 mL 3  ? fluticasone (FLONASE) 50 MCG/ACT nasal spray USE 2 SPRAYS IN BOTH  NOSTRILS DAILY 48 g 3  ? gabapentin (NEURONTIN) 300 MG capsule TAKE 1  CAPSULE BY MOUTH 3  TIMES DAILY 270 capsule 3  ? hydrochlorothiazide (HYDRODIURIL) 25 MG tablet TAKE 1 TABLET BY MOUTH  DAILY 90 tablet 3  ? loratadine (CLARITIN) 10 MG tablet Take 1 tablet (10 mg total) by mouth daily. 90 tablet 3  ? meloxicam (MOBIC) 7.5 MG tablet TAKE 1 TABLET BY MOUTH  DAILY 90 tablet 3  ? montelukast (SINGULAIR) 10 MG tablet TAKE 1 TABLET BY MOUTH AT  BEDTIME 90 tablet 3  ? Multiple Vitamin (MULTIVITAMIN) capsule Take 1 capsule by mouth daily.    ? omeprazole (PRILOSEC) 40 MG capsule TAKE 1 CAPSULE BY MOUTH  DAILY 90 capsule 3  ? Potassium Gluconate 2.5 MEQ TABS Take by mouth.    ? senna-docusate (SENOKOT-S) 8.6-50 MG tablet Take 2 tablets by mouth at bedtime.    ? tamsulosin (FLOMAX) 0.4 MG CAPS capsule TAKE 1 CAPSULE BY MOUTH  DAILY 90 capsule 3  ? warfarin (COUMADIN) 5 MG tablet TAKE 1 TABLET BY MOUTH DAILY  EXCEPT 1 AND 1/2 TABLETS BY  MOUTH ON MONDAYS OR AS DIRECTED  BY COUMADIN CLINIC 117 tablet 2  ? ?No current facility-administered medications on file prior to visit.  ? ? ?Allergies  ?Allergen Reactions  ? Simvastatin   ?  Listlessness, fatigue  ? Mucinex D [Pseudoephedrine-Guaifenesin Er]   ? Pseudoephedrine   ?  Other reaction(s): Unknown  ? Undecylenic Acid   ? Flexeril [Cyclobenzaprine Hcl] Anxiety  ? Metaxalone Anxiety  ? Skelaxin Anxiety  ? ? ?Past Medical History:  ?Diagnosis Date  ? Allergic rhinitis   ?  Atrial fibrillation ([place])   ? chronic persistent, failed DCCV x3 in 2000  ? BPH (benign prostatic hypertrophy)   ? Cervical disc disease   ? Diverticulitis   ? ED (erectile dysfunction)   ? GERD (gastroesophageal reflux disease)   ? Hyperlipidemia   ? Hypertension   ? Impaired fasting glucose   ? Lumbar disc disease   ? Osteoarthritis   ? Sleep disturbance   ? ? ?Past Surgical History:  ?Procedure Laterality Date  ? CATARACT EXTRACTION, BILATERAL  2014  ? COLONOSCOPY  2013  ? COLONOSCOPY WITH PROPOFOL N/A 04/28/2020  ? Procedure: COLONOSCOPY WITH PROPOFOL;  Surgeon: Mark Lame,  MD;  Location: [facility] ENDOSCOPY;  Service: Endoscopy;  Laterality: N/A;  ? HEMIARTHROPLASTY SHOULDER FRACTURE  10/2004  ? left  ? JOINT REPLACEMENT  4/12  ? Left hip--Dr Mark Padilla  ? KNEE ARTHROSCOPY  06/1998  ? right Mark Padilla)  ? LUMBAR LAMINECTOMY  06/10/13  ? [facility]  ? REPLACEMENT UNICONDYLAR JOINT KNEE  8/12  ? Dr Mark Padilla  ? SHOULDER SURGERY  1950's  ? dislocation  ? SHOULDER SURGERY  12/2004  ? right  ? THR--left  4/12  ? Dr Mark Padilla  ? ? ?Family History  ?Problem Relation Age of Onset  ? Stomach cancer Mother   ? Stroke Father   ? Colon polyps Brother   ? Colon cancer Neg Hx   ? ? ?Social History  ? ?Socioeconomic History  ? Marital status: Single  ?  Spouse name: Not on file  ? Number of children: 2  ? Years of education: Not on file  ? Highest education level: Not on file  ?Occupational History  ? 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  Employer: Mark Padilla  ?Tobacco Use  ? Smoking status: Former  ?  Types: Cigarettes  ?  Quit date: 11/21/1962  ?  Years since quitting: 59.3  ?  Passive exposure: Past  ? Smokeless tobacco: Never  ?Vaping Use  ? Vaping Use: Never used  ?Substance and Sexual Activity  ? Alcohol use: Yes  ?  Alcohol/week: 0.0 standard drinks  ?  Comment: occasional  ? Drug use: No  ? Sexual activity: Not on file  ?Other Topics Concern  ? Not on file  ?Social History Narrative  ? Plans to do living will  ? Will designate daughter Mark Padilla as health care POA.   ? Would accept resuscitation but no prolonged artificial ventilation  ? Would not want tube feeds if cognitively unaware  ? ?Social Determinants of Health  ? ?Financial Resource Strain: Low Risk   ? Difficulty of Paying Living Expenses: Not very hard  ?Food Insecurity: Not on file  ?Transportation Needs: Not on file  ?Physical Activity: Not on file  ?Stress: Not on file  ?Social Connections: Not on file  ?Intimate Partner Violence: Not on file  ? ?Review of Systems ?Appetite is okay ?Weight is stable ?Sleeps well ?Wears  seat belt ?All dentures--no need for dentist ?No regular heartburn on the omeprazole--no dysphagia ?Bowels are slow--but 2 senna-s do help ?No suspicious skin lesions now--going to derm soon ? ?   ?Objective:  ? Physical Exam ?Constitutional:   ?   Appearance: Normal appearance.  ?HENT:  ?   Mouth/Throat:  ?   Comments: No lesions ?Eyes:  ?   Conjunctiva/sclera: Conjunctivae normal.  ?   Pupils: Pupils are equal, round, and reactive to light.  ?Cardiovascular:  ?   Rate and Rhythm: Normal rate. Rhythm irregular.  ?  Pulses: Normal pulses.  ?   Heart sounds: No murmur heard. ?  No gallop.  ?Pulmonary:  ?   Effort: Pulmonary effort is normal.  ?   Breath sounds: Normal breath sounds. No wheezing or rales.  ?Abdominal:  ?   Palpations: Abdomen is soft.  ?   Tenderness: There is no abdominal tenderness.  ?Musculoskeletal:  ?   Cervical back: Neck supple.  ?   Right lower leg: No edema.  ?   Left lower leg: No edema.  ?Lymphadenopathy:  ?   Cervical: No cervical adenopathy.  ?Skin: ?   Findings: No lesion or rash.  ?Neurological:  ?   General: No focal deficit present.  ?   Mental Status: He is alert and oriented to person, place, and time.  ?   Comments: Mini-cog---clock okay, recall 2/3  ?Psychiatric:     ?   Mood and Affect: Mood normal.     ?   Behavior: Behavior normal.  ?  ? ? ? ? ?   ?Assessment & Plan:  ? ?

## 2022-03-23 NOTE — Addendum Note (Signed)
Addended by: Viviana Simpler I on: 03/23/2022 01:20 PM ? ? Modules accepted: Orders ? ?

## 2022-03-23 NOTE — Patient Instructions (Signed)
You can try holding off on the morning gabapentin to see if it makes any difference. ?

## 2022-03-27 ENCOUNTER — Other Ambulatory Visit: Payer: Self-pay | Admitting: Internal Medicine

## 2022-04-05 ENCOUNTER — Other Ambulatory Visit: Payer: Self-pay | Admitting: Internal Medicine

## 2022-04-12 NOTE — Progress Notes (Unsigned)
Cardiology Office Note  Date:  04/13/2022   ID:  Mark Padilla, DOB 08-12-1935, MRN [id number]  PCP:  Venia Carbon, MD   Chief Complaint  Patient presents with   12 month follow up     "Doing well." Medications reviewed by the patient verbally.     HPI:  87 year-old gentleman with PMH of  chronic atrial fibrillation on warfarin,  HTN,  DC cardioversion that was unsuccessful,  hyperlipidemia,  hip replacement on the left in April 2012,  right knee replacement August 2012 back surgery July 2014 with decompression previously on Zocor, Changed to Lipitor He presents for routine follow-up of his atrial fibrillation  LOV 3/22 In follow-up today reports having Chronic back pain, Had nerves "burned", pain come back again May need to have procedure done again  Balance not as good, Prior hip replacement off pain meds, he does have prescription for  oxycodone but does not use it  Lab work reviewed Total chol 143, LDL 71, numbers higher BMP normal  Weight up from 182 to 191 pounds today Weight at home 183 pounds  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 Last week, "shagging", etoh  Denies any chest pain concerning for angina, no shortness of breath  ABD pain better  EKG personally reviewed by myself on todays visit atrial fibrillation with ventricular rate 68  bpm, no significant ST or T-wave changes  PMH:   has a past medical history of Allergic rhinitis, Atrial fibrillation ([place]), BPH (benign prostatic hypertrophy), Cervical disc disease, Diverticulitis, ED (erectile dysfunction), GERD (gastroesophageal reflux disease), Hyperlipidemia, Hypertension, Impaired fasting glucose, Lumbar disc disease, Osteoarthritis, and Sleep disturbance.  PSH:    Past Surgical History:  Procedure Laterality Date   CATARACT EXTRACTION, BILATERAL  2014   COLONOSCOPY  2013   COLONOSCOPY WITH PROPOFOL N/A 04/28/2020   Procedure: COLONOSCOPY WITH PROPOFOL;  Surgeon: Lucilla Lame,  MD;  Location: [facility] ENDOSCOPY;  Service: Endoscopy;  Laterality: N/A;   HEMIARTHROPLASTY SHOULDER FRACTURE  10/2004   left   JOINT REPLACEMENT  4/12   Left hip--Dr Jefm Leanos   KNEE ARTHROSCOPY  06/1998   right Jefm Blaydes)   LUMBAR LAMINECTOMY  06/10/13   [facility]   REPLACEMENT UNICONDYLAR JOINT KNEE  8/12   Dr Jefm Zahm   SHOULDER SURGERY  1950's   dislocation   SHOULDER SURGERY  12/2004   right   THR--left  4/12   Dr Jefm Repka    Current Outpatient Medications  Medication Sig Dispense Refill   ALPRAZolam (XANAX) 1 MG tablet TAKE 1 TABLET BY MOUTH  TWICE DAILY AS NEEDED 180 tablet 0   Ascorbic Acid (VITAMIN C) 500 MG tablet Take 500 mg by mouth daily.     atorvastatin (LIPITOR) 10 MG tablet TAKE 1 TABLET BY MOUTH  DAILY 90 tablet 3   azelastine (ASTELIN) 0.1 % nasal spray USE TWO SPRAYS INTO BOTH NOSTRILS AT BEDTIME 30 mL 3   fluticasone (FLONASE) 50 MCG/ACT nasal spray USE 2 SPRAYS IN BOTH  NOSTRILS DAILY 48 g 3   gabapentin (NEURONTIN) 300 MG capsule Take 1 cap by mouth in am and 2 in pm. 270 capsule 3   hydrochlorothiazide (HYDRODIURIL) 25 MG tablet TAKE 1 TABLET BY MOUTH  DAILY 90 tablet 3   loratadine (CLARITIN) 10 MG tablet Take 1 tablet (10 mg total) by mouth daily. 90 tablet 3   meloxicam (MOBIC) 7.5 MG tablet TAKE 1 TABLET BY MOUTH  DAILY 90 tablet 3   montelukast (SINGULAIR)  10 MG tablet TAKE 1 TABLET BY MOUTH AT  BEDTIME 90 tablet 3   Multiple Vitamin (MULTIVITAMIN) capsule Take 1 capsule by mouth daily.     omeprazole (PRILOSEC) 40 MG capsule TAKE 1 CAPSULE BY MOUTH  DAILY 90 capsule 3   Potassium Gluconate 2.5 MEQ TABS Take by mouth daily.     senna-docusate (SENOKOT-S) 8.6-50 MG tablet Take 2 tablets by mouth at bedtime.     tamsulosin (FLOMAX) 0.4 MG CAPS capsule TAKE 1 CAPSULE BY MOUTH  DAILY 90 capsule 3   warfarin (COUMADIN) 5 MG tablet TAKE 1 TABLET BY MOUTH DAILY  EXCEPT 1 AND 1/2 TABLETS BY  MOUTH ON MONDAYS OR AS DIRECTED  BY COUMADIN CLINIC 117 tablet 2   No  current facility-administered medications for this visit.     Allergies:   Simvastatin, Mucinex d [pseudoephedrine-guaifenesin er], Pseudoephedrine, Undecylenic acid, Flexeril [cyclobenzaprine hcl], Metaxalone, and Skelaxin   Social History:  The patient  reports that he quit smoking about 59 years ago. His smoking use included cigarettes. He has been exposed to tobacco smoke. He has never used smokeless tobacco. He reports current alcohol use. He reports that he does not use drugs.   Family History:   family history includes Colon polyps in his brother; Stomach cancer in his mother; Stroke in his father.    Review of Systems: Review of Systems  Constitutional: Negative.   HENT: Negative.    Respiratory: Negative.    Cardiovascular: Negative.   Gastrointestinal: Negative.   Musculoskeletal:  Positive for back pain and joint pain.  Neurological: Negative.   Psychiatric/Behavioral: Negative.    All other systems reviewed and are negative.  PHYSICAL EXAM: VS:  BP 130/60 (BP Location: Left Arm, Patient Position: Sitting, Cuff Size: Normal)   Pulse 68   Ht '5\' 7"'$  (1.702 m)   Wt 191 lb 2 oz (86.7 kg)   SpO2 99%   BMI 29.93 kg/m  , BMI Body mass index is 29.93 kg/m. Constitutional:  oriented to person, place, and time. No distress.  HENT:  Head: Grossly normal Eyes:  no discharge. No scleral icterus.  Neck: No JVD, no carotid bruits  Cardiovascular: irreg irreg,  no murmurs appreciated Pulmonary/Chest: Clear to auscultation bilaterally, no wheezes or rails Abdominal: Soft.  no distension.  no tenderness.  Musculoskeletal: Normal range of motion Neurological:  normal muscle tone. Coordination normal. No atrophy Skin: Skin warm and dry Psychiatric: normal affect, pleasant   Recent Labs: 03/23/2022: ALT 23; BUN 14; Creatinine, Ser 0.78; Hemoglobin 15.2; Platelets 225.0; Potassium 4.0; Sodium 135    Lipid Panel Lab Results  Component Value Date   CHOL 143 03/23/2022   HDL  53.40 03/23/2022   LDLCALC 71 03/23/2022   TRIG 94.0 03/23/2022      Wt Readings from Last 3 Encounters:  04/13/22 191 lb 2 oz (86.7 kg)  03/23/22 184 lb (83.5 kg)  11/03/21 183 lb 9.6 oz (83.3 kg)     ASSESSMENT AND PLAN:  Atrial fibrillation, unspecified type ([place]) - Plan: EKG 12-Lead Rate well controlled, tolerating warfarin On warfarin,  atenolol held 2020 for bradycardia  Mixed hyperlipidemia Cholesterol is at goal on the current lipid regimen. No changes to the medications were made. Higher with weight gain  Chronic back pain laminectomy at [facility] Dr. Delilah Shan  Followed by Dr. Sharlet Salina, still with pain May have additional procedures for pain, currently not using any oxycodone prefers not to  Essential hypertension Blood pressure is well controlled on today's visit.  No changes made to the medications.   Total encounter time more than 30 minutes  Greater than 50% was spent in counseling and coordination of care with the patient     No orders of the defined types were placed in this encounter.    Signed, Esmond Plants, M.D., Ph.D. 04/13/2022  [place], [place]

## 2022-04-13 ENCOUNTER — Ambulatory Visit: Payer: Medicare Other | Admitting: Cardiovascular Disease

## 2022-04-13 ENCOUNTER — Encounter: Payer: Self-pay | Admitting: Cardiovascular Disease

## 2022-04-13 VITALS — BP 130/60 | HR 68 | Ht 67.0 in | Wt 191.1 lb

## 2022-04-13 DIAGNOSIS — E782 Mixed hyperlipidemia: Secondary | ICD-10-CM

## 2022-04-13 DIAGNOSIS — I4821 Permanent atrial fibrillation: Secondary | ICD-10-CM

## 2022-04-13 DIAGNOSIS — I10 Essential (primary) hypertension: Secondary | ICD-10-CM

## 2022-04-13 NOTE — Patient Instructions (Signed)
Medication Instructions:  No changes  If you need a refill on your cardiac medications before your next appointment, please call your pharmacy.   Lab work: No new labs needed  Testing/Procedures: No new testing needed  Follow-Up: At [facility], you and your health needs are our priority.  As part of our continuing mission to provide you with exceptional heart care, we have created designated Provider Care Teams.  These Care Teams include your primary Cardiologist (physician) and Advanced Practice Providers (APPs -  Physician Assistants and Nurse Practitioners) who all work together to provide you with the care you need, when you need it.  You will need a follow up appointment in 12 months  Providers on your designated Care Team:   Christopher Berge, NP [place], PA-C Cadence Furth, PA-C  COVID-19 Vaccine Information can be found at: [email or web address] For questions related to vaccine distribution or appointments, please email [email or web address] or call [phone].   

## 2022-04-14 ENCOUNTER — Ambulatory Visit (INDEPENDENT_AMBULATORY_CARE_PROVIDER_SITE_OTHER): Payer: Medicare Other

## 2022-04-14 DIAGNOSIS — Z5181 Encounter for therapeutic drug level monitoring: Secondary | ICD-10-CM

## 2022-04-14 DIAGNOSIS — Z7901 Long term (current) use of anticoagulants: Secondary | ICD-10-CM

## 2022-04-14 DIAGNOSIS — I4819 Other persistent atrial fibrillation: Secondary | ICD-10-CM

## 2022-04-14 LAB — POCT INR: INR: 3.2 — AB (ref 2.0–3.0)

## 2022-04-14 NOTE — Progress Notes (Addendum)
Pt is to have a procedure on 6/5 to have nerves burned in his back. No hold of warfarin is needed. They will check INR the day before.  Hold dose today and then continue 1 tablet daily except take 1 1/2 tablets on Mondays. Recheck in  4 weeks

## 2022-04-14 NOTE — Patient Instructions (Addendum)
Pre visit review using our clinic review tool, if applicable. No additional management support is needed unless otherwise documented below in the visit note.  Hold dose today and then continue 1 tablet daily except take 1 1/2 tablets on Mondays. Recheck in  4 weeks

## 2022-04-25 DIAGNOSIS — Z7901 Long term (current) use of anticoagulants: Secondary | ICD-10-CM | POA: Diagnosis not present

## 2022-04-26 ENCOUNTER — Telehealth: Payer: Self-pay

## 2022-04-26 DIAGNOSIS — M47816 Spondylosis without myelopathy or radiculopathy, lumbar region: Secondary | ICD-10-CM | POA: Diagnosis not present

## 2022-04-26 NOTE — Telephone Encounter (Signed)
Pt reported at last coumadin clinic apt he will be having a lumbar MBB today. They have not asked him to hold his warfarin but did request he go in their clinic for an INR. INR yesterday from the lab was 1.7  No changes will be made to dosing due to pt having procedure today. Pt has f/u coumadin clinic apt on 6/22. Pt was advised at last coumadin clinic apt to contact the clinic if any questions. Pt verbalized understanding

## 2022-04-28 NOTE — Telephone Encounter (Signed)
Pt LVM reporting he did hold one dose of warfarin and after the procedure on 6/6 he took 1 1/2 times his normal dose.   Pt reports he is feeling very good after the procedure. Advised pt to continue normal dosing and recheck on 6/22 as scheduled. Advised if anything changed to contact the coumadin clinic. Pt verbalized understanding.

## 2022-05-04 ENCOUNTER — Telehealth: Payer: Self-pay

## 2022-05-04 NOTE — Telephone Encounter (Signed)
Pt LVM reporting he would like to [facility] coumadin clinic apt from 6/22 to 6/15. LVM for pt there is any opening tomorrow at 9am or afternoon openings.

## 2022-05-05 ENCOUNTER — Ambulatory Visit (INDEPENDENT_AMBULATORY_CARE_PROVIDER_SITE_OTHER): Payer: Medicare Other

## 2022-05-05 DIAGNOSIS — Z7901 Long term (current) use of anticoagulants: Secondary | ICD-10-CM | POA: Diagnosis not present

## 2022-05-05 LAB — POCT INR: INR: 1.7 — AB (ref 2.0–3.0)

## 2022-05-05 NOTE — Progress Notes (Signed)
Increase dose today to take 1 1/2 tablets and then continue 1 tablet daily except take 1 1/2 tablets on Mondays. Recheck in  3 weeks.

## 2022-05-05 NOTE — Patient Instructions (Addendum)
Pre visit review using our clinic review tool, if applicable. No additional management support is needed unless otherwise documented below in the visit note.  Increase dose today to take 1 1/2 tablets and then continue 1 tablet daily except take 1 1/2 tablets on Mondays. Recheck in  3 weeks.

## 2022-05-12 ENCOUNTER — Ambulatory Visit: Payer: Medicare Other

## 2022-05-18 DIAGNOSIS — D2272 Melanocytic nevi of left lower limb, including hip: Secondary | ICD-10-CM | POA: Diagnosis not present

## 2022-05-18 DIAGNOSIS — D2261 Melanocytic nevi of right upper limb, including shoulder: Secondary | ICD-10-CM | POA: Diagnosis not present

## 2022-05-18 DIAGNOSIS — D485 Neoplasm of uncertain behavior of skin: Secondary | ICD-10-CM | POA: Diagnosis not present

## 2022-05-18 DIAGNOSIS — L570 Actinic keratosis: Secondary | ICD-10-CM | POA: Diagnosis not present

## 2022-05-18 DIAGNOSIS — Z85828 Personal history of other malignant neoplasm of skin: Secondary | ICD-10-CM | POA: Diagnosis not present

## 2022-05-18 DIAGNOSIS — B353 Tinea pedis: Secondary | ICD-10-CM | POA: Diagnosis not present

## 2022-05-18 DIAGNOSIS — D692 Other nonthrombocytopenic purpura: Secondary | ICD-10-CM | POA: Diagnosis not present

## 2022-05-18 DIAGNOSIS — D2262 Melanocytic nevi of left upper limb, including shoulder: Secondary | ICD-10-CM | POA: Diagnosis not present

## 2022-05-25 ENCOUNTER — Other Ambulatory Visit: Payer: Self-pay | Admitting: Internal Medicine

## 2022-05-26 ENCOUNTER — Ambulatory Visit (INDEPENDENT_AMBULATORY_CARE_PROVIDER_SITE_OTHER): Payer: Medicare Other

## 2022-05-26 DIAGNOSIS — Z7901 Long term (current) use of anticoagulants: Secondary | ICD-10-CM | POA: Diagnosis not present

## 2022-05-26 DIAGNOSIS — I4819 Other persistent atrial fibrillation: Secondary | ICD-10-CM

## 2022-05-26 DIAGNOSIS — Z5181 Encounter for therapeutic drug level monitoring: Secondary | ICD-10-CM

## 2022-05-26 LAB — POCT INR: INR: 2.2 (ref 2.0–3.0)

## 2022-05-26 NOTE — Patient Instructions (Addendum)
Pre visit review using our clinic review tool, if applicable. No additional management support is needed unless otherwise documented below in the visit note.  Continue 1 tablet daily except take 1 1/2 tablets on Mondays. Recheck in  6 weeks.  

## 2022-05-26 NOTE — Progress Notes (Signed)
Continue 1 tablet daily except take 1 1/2 tablets on Mondays. Recheck in  6 weeks.  

## 2022-05-31 DIAGNOSIS — M5126 Other intervertebral disc displacement, lumbar region: Secondary | ICD-10-CM | POA: Diagnosis not present

## 2022-05-31 DIAGNOSIS — M47816 Spondylosis without myelopathy or radiculopathy, lumbar region: Secondary | ICD-10-CM | POA: Diagnosis not present

## 2022-05-31 DIAGNOSIS — M5136 Other intervertebral disc degeneration, lumbar region: Secondary | ICD-10-CM | POA: Diagnosis not present

## 2022-07-02 ENCOUNTER — Other Ambulatory Visit: Payer: Self-pay | Admitting: Internal Medicine

## 2022-07-05 ENCOUNTER — Telehealth: Payer: Self-pay

## 2022-07-05 NOTE — Telephone Encounter (Signed)
Last filled 01-31-22 #180 Last OV 05-31-22 Next OV 03-28-23 [facility]

## 2022-07-05 NOTE — Chronic Care Management (AMB) (Signed)
    Chronic Care Management Pharmacy Assistant   Name: Mark Padilla  MRN: [id number] DOB: Nov 10, 1935   Reason for Encounter: Reminder Call    Conditions to be addressed/monitored: HTN and HLD   Recent office visits:  03/23/22-Mark Letvak,MD(PCP)-[facility],labs(Your labs look good- Your cholesterol remains pretty low, everything else is normal.) discussed screenings, vaccines,Will refill small quantity oxycodone--for emergency use,try holding off on the morning gabapentin to see if it makes any difference.f/u 1 year  Recent consult visits:  04/26/22-[facility], (PM&R)- lumbar radiofrequency procedure 04/13/22-Mark Gollan,MD(cardio)-F/U Afib,EKG,no medication changes f/u 12 months. 11/03/21-Mark Wohl,MD(gastro)-f/u abdominal pain, monitor what he ate or drank and the amount of air he is swallowing next time he has this attack and has been asked to try and avoid swallowing air during these attacks thereby causing him to burp. It is unlikely that any further GI workup will be informative.  Hospital visits:  None in previous 6 months  Medications: Outpatient Encounter Medications as of 07/05/2022  Medication Sig   ALPRAZolam (XANAX) 1 MG tablet TAKE 1 TABLET BY MOUTH  TWICE DAILY AS NEEDED   Ascorbic Acid (VITAMIN C) 500 MG tablet Take 500 mg by mouth daily.   atorvastatin (LIPITOR) 10 MG tablet TAKE 1 TABLET BY MOUTH  DAILY   azelastine (ASTELIN) 0.1 % nasal spray USE TWO SPRAYS INTO BOTH NOSTRILS AT BEDTIME   fluticasone (FLONASE) 50 MCG/ACT nasal spray USE 2 SPRAYS IN BOTH  NOSTRILS DAILY   gabapentin (NEURONTIN) 300 MG capsule Take 1 cap by mouth in am and 2 in pm.   hydrochlorothiazide (HYDRODIURIL) 25 MG tablet TAKE 1 TABLET BY MOUTH  DAILY   loratadine (CLARITIN) 10 MG tablet Take 1 tablet (10 mg total) by mouth daily.   meloxicam (MOBIC) 7.5 MG tablet TAKE 1 TABLET BY MOUTH  DAILY   montelukast (SINGULAIR) 10 MG tablet TAKE 1 TABLET BY MOUTH AT  BEDTIME   Multiple Vitamin  (MULTIVITAMIN) capsule Take 1 capsule by mouth daily.   omeprazole (PRILOSEC) 40 MG capsule TAKE 1 CAPSULE BY MOUTH  DAILY   Potassium Gluconate 2.5 MEQ TABS Take by mouth daily.   senna-docusate (SENOKOT-S) 8.6-50 MG tablet Take 2 tablets by mouth at bedtime.   tamsulosin (FLOMAX) 0.4 MG CAPS capsule TAKE 1 CAPSULE BY MOUTH  DAILY   warfarin (COUMADIN) 5 MG tablet TAKE 1 TABLET BY MOUTH DAILY  EXCEPT 1 AND 1/2 TABLETS BY  MOUTH ON MONDAYS OR AS DIRECTED  BY COUMADIN CLINIC   No facility-administered encounter medications on file as of 07/05/2022.  Mark Padilla was contacted to remind of upcoming telephone visit with Mark Padilla on 07/08/22 at 8:45am. Patient was reminded to have any blood glucose and blood pressure readings available for review at appointment.   Message was left reminding patient of appointment.   CCM referral has been placed prior to visit?  No   Star Rating Drugs: Medication:  Last Fill: Day Supply Atorvastatin '10mg'$  06/23/22  [place], CPP notified  Avel Sensor, [place]  [phone]

## 2022-07-07 ENCOUNTER — Ambulatory Visit (INDEPENDENT_AMBULATORY_CARE_PROVIDER_SITE_OTHER): Payer: Medicare Other

## 2022-07-07 DIAGNOSIS — Z7901 Long term (current) use of anticoagulants: Secondary | ICD-10-CM | POA: Diagnosis not present

## 2022-07-07 DIAGNOSIS — Z5181 Encounter for therapeutic drug level monitoring: Secondary | ICD-10-CM

## 2022-07-07 DIAGNOSIS — I4819 Other persistent atrial fibrillation: Secondary | ICD-10-CM

## 2022-07-07 LAB — POCT INR: INR: 2.5 (ref 2.0–3.0)

## 2022-07-07 NOTE — Progress Notes (Signed)
Continue 1 tablet daily except take 1 1/2 tablets on Mondays. Recheck in  6 weeks.  

## 2022-07-07 NOTE — Patient Instructions (Addendum)
Pre visit review using our clinic review tool, if applicable. No additional management support is needed unless otherwise documented below in the visit note.  Continue 1 tablet daily except take 1 1/2 tablets on Mondays. Recheck in  6 weeks.  

## 2022-07-08 ENCOUNTER — Ambulatory Visit: Payer: Medicare Other | Admitting: Pharmacist

## 2022-07-08 DIAGNOSIS — I4819 Other persistent atrial fibrillation: Secondary | ICD-10-CM

## 2022-07-08 DIAGNOSIS — E782 Mixed hyperlipidemia: Secondary | ICD-10-CM

## 2022-07-08 DIAGNOSIS — M5136 Other intervertebral disc degeneration, lumbar region: Secondary | ICD-10-CM

## 2022-07-08 DIAGNOSIS — I10 Essential (primary) hypertension: Secondary | ICD-10-CM

## 2022-07-08 NOTE — Progress Notes (Signed)
Chronic Care Management Pharmacy Note  07/08/2022 Name:  Mark Padilla MRN:  [id number] DOB:  1934/12/25  Summary: CCM F/U visit -Reviewed medications; pt affirms compliance as prescribed -Discussed gabapentin, pt wants to get off of this - reviewed PCP advice to hold AM dose and see how he feels  Recommendations/Changes made from today's visit: -Can hold AM dose of gabapentin and monitor pain  Plan: -Transition CCM to Self Care: Patient achieved CCM goals and no longer needs to be contacted as frequently. The patient has been provided with contact information for the care management team and has been advised to call with any health related questions or concerns.  -PCP annual 03/28/23   Subjective: Mark Padilla is an 87 y.o. year old male who is a primary patient of Mark Padilla, Mark Kinds, MD.  The CCM team was consulted for assistance with disease management and care coordination needs.    Engaged with patient by telephone for follow up visit in response to provider referral for pharmacy case management and/or care coordination services.   Consent to Services:  The patient was given information about Chronic Care Management services, agreed to services, and gave verbal consent prior to initiation of services.  Please see initial visit note for detailed documentation.   Patient Care Team: Venia Carbon, MD as PCP - General (Internal Medicine) Rockey Situ Kathlene November, MD as Consulting Physician (Cardiology) Charlton Haws, [facility] as Pharmacist (Pharmacist)  Recent office visits: 03/23/22-Richard Letvak,MD(PCP)-AWV,labs(Your labs look good- Your cholesterol remains pretty low, everything else is normal.) discussed screenings, vaccines,Will refill small quantity oxycodone--for emergency use,try holding off on the morning gabapentin to see if it makes any difference.f/u 1 year  Recent consult visits: 04/26/22-[facility], (PM&R)- lumbar radiofrequency procedure 04/13/22-Timothy  Gollan,MD (Cardiology)-F/U Afib,EKG,no medication changes f/u 12 months. 11/03/21-Darren Wohl,MD(GI)-f/u abdominal pain, monitor what he ate or drank and the amount of air he is swallowing next time he has this attack and has been asked to try and avoid swallowing air during these attacks thereby causing him to burp. It is unlikely that any further GI workup will be informative.  Hospital visits: None in previous 6 months   Objective:  Lab Results  Component Value Date   CREATININE 0.78 03/23/2022   BUN 14 03/23/2022   GFR 81.43 03/23/2022   GFRNONAA 55 (L) 01/20/2014   GFRAA >60 01/20/2014   NA 135 03/23/2022   K 4.0 03/23/2022   CALCIUM 9.4 03/23/2022   CO2 31 03/23/2022   GLUCOSE 97 03/23/2022    Lab Results  Component Value Date/Time   HGBA1C 5.7 12/30/2013 12:37 PM   HGBA1C 5.7 05/07/2012 11:13 AM   GFR 81.43 03/23/2022 09:13 AM   GFR 82.04 03/02/2021 10:00 AM    Last diabetic Eye exam: No results found for: "HMDIABEYEEXA"  Last diabetic Foot exam: No results found for: "HMDIABFOOTEX"   Lab Results  Component Value Date   CHOL 143 03/23/2022   HDL 53.40 03/23/2022   LDLCALC 71 03/23/2022   LDLDIRECT 131.5 04/28/2011   TRIG 94.0 03/23/2022   CHOLHDL 3 03/23/2022       Latest Ref Rng & Units 03/23/2022    9:13 AM 03/02/2021   10:00 AM 02/25/2020   10:32 AM  Hepatic Function  Total Protein 6.0 - 8.3 g/dL 7.3  7.0  6.9   Albumin 3.5 - 5.2 g/dL 4.4  4.2  4.3   AST 0 - 37 U/L 33  35  29   ALT 0 -  53 U/L 23  22  21    Alk Phosphatase 39 - 117 U/L 82  96  76   Total Bilirubin 0.2 - 1.2 mg/dL 0.6  0.8  0.6     Lab Results  Component Value Date/Time   TSH 0.81 12/30/2013 12:37 PM   TSH 1.50 12/04/2012 10:43 AM   FREET4 0.83 03/23/2022 09:13 AM   FREET4 0.80 03/02/2021 10:00 AM       Latest Ref Rng & Units 03/23/2022    9:13 AM 03/02/2021   10:00 AM 02/25/2020   10:32 AM  CBC  WBC 4.0 - 10.5 K/uL 5.4  4.7  5.7   Hemoglobin 13.0 - 17.0 g/dL 15.2  14.7  14.5    Hematocrit 39.0 - 52.0 % 45.6  43.5  42.3   Platelets 150.0 - 400.0 K/uL 225.0  191.0  256.0     No results found for: "VD25OH"  Clinical ASCVD: No  The ASCVD Risk score (Arnett DK, et al., 2019) failed to calculate for the following reasons:   The 2019 ASCVD risk score is only valid for ages 48 to 51       03/23/2022    8:57 AM 03/02/2021    9:54 AM 02/25/2020   10:09 AM  Depression screen PHQ 2/9  Decreased Interest 0 0 0  Down, Depressed, Hopeless 1 0 0  PHQ - 2 Score 1 0 0     CHA2DS2/VAS Stroke Risk Points  Current as of yesterday     3 >= 2 Points: High Risk  1 - 1.99 Points: Medium Risk  0 Points: Low Risk    Last Change: N/A      Points Metrics  0 Has Congestive Heart Failure:  No    Current as of yesterday  0 Has Vascular Disease:  No    Current as of yesterday  1 Has Hypertension:  Yes    Current as of yesterday  2 Age:  11    Current as of yesterday  0 Has Diabetes:  No    Current as of yesterday  0 Had Stroke:  No  Had TIA:  No  Had Thromboembolism:  No    Current as of yesterday  0 Male:  No    Current as of yesterday     Social History   Tobacco Use  Smoking Status Former   Types: Cigarettes   Quit date: 11/21/1962   Years since quitting: 59.6   Passive exposure: Past  Smokeless Tobacco Never   BP Readings from Last 3 Encounters:  04/13/22 130/60  03/23/22 134/84  11/03/21 134/75   Pulse Readings from Last 3 Encounters:  04/13/22 68  03/23/22 62  11/03/21 85   Wt Readings from Last 3 Encounters:  04/13/22 191 lb 2 oz (86.7 kg)  03/23/22 184 lb (83.5 kg)  11/03/21 183 lb 9.6 oz (83.3 kg)   BMI Readings from Last 3 Encounters:  04/13/22 29.93 kg/m  03/23/22 29.70 kg/m  11/03/21 28.76 kg/m    Assessment/Interventions: Review of patient past medical history, allergies, medications, health status, including review of consultants reports, laboratory and other test data, was performed as part of comprehensive evaluation and provision  of chronic care management services.   SDOH:  (Social Determinants of Health) assessments and interventions performed: Yes SDOH Interventions    Flowsheet Row Most Recent Value  SDOH Interventions   Food Insecurity Interventions Intervention Not Indicated  Financial Strain Interventions Intervention Not Indicated      SDOH  Screenings   Alcohol Screen: Not on file  Depression (PHQ2-9): Low Risk  (03/23/2022)   Depression (PHQ2-9)    PHQ-2 Score: 1  Financial Resource Strain: Low Risk  (07/08/2022)   Overall Financial Resource Strain (CARDIA)    Difficulty of Paying Living Expenses: Not very hard  Food Insecurity: No Food Insecurity (07/08/2022)   Hunger Vital Sign    Worried About Running Out of Food in the Last Year: Never true    Ran Out of Food in the Last Year: Never true  Housing: Not on file  Physical Activity: Not on file  Social Connections: Not on file  Stress: Not on file  Tobacco Use: Medium Risk (04/13/2022)   Patient History    Smoking Tobacco Use: Former    Smokeless Tobacco Use: Never    Passive Exposure: Past  Transportation Needs: Not on file    [place]  Allergies  Allergen Reactions   Simvastatin     Listlessness, fatigue   Mucinex D [Pseudoephedrine-Guaifenesin Er]    Pseudoephedrine     Other reaction(s): Unknown   Undecylenic Acid    Flexeril [Cyclobenzaprine Hcl] Anxiety   Metaxalone Anxiety   Skelaxin Anxiety    Medications Reviewed Today     Reviewed by Charlton Haws, RPH (Pharmacist) on 07/08/22 at [place] List Status: <None>   Medication Order Taking? Sig Documenting Provider Last Dose Status Informant  ALPRAZolam (XANAX) 1 MG tablet [id number] Yes TAKE 1 TABLET BY MOUTH TWICE  DAILY AS NEEDED Venia Carbon, MD Taking Active   Ascorbic Acid (VITAMIN C) 500 MG tablet [id number] Yes Take 500 mg by mouth daily. [provider] Taking Active   atorvastatin (LIPITOR) 10 MG tablet [id number] Yes TAKE 1 TABLET BY MOUTH   DAILY Venia Carbon, MD Taking Active   azelastine (ASTELIN) 0.1 % nasal spray [id number] Yes USE TWO SPRAYS INTO BOTH NOSTRILS AT BEDTIME Venia Carbon, MD Taking Active   fluticasone [facility]) 50 MCG/ACT nasal spray [id number] Yes USE 2 SPRAYS IN BOTH  NOSTRILS DAILY Venia Carbon, MD Taking Active   gabapentin (NEURONTIN) 300 MG capsule [id number] Yes Take 1 cap by mouth in am and 2 in pm. Venia Carbon, MD Taking Active   hydrochlorothiazide (HYDRODIURIL) 25 MG tablet [id number] Yes TAKE 1 TABLET BY MOUTH  DAILY Venia Carbon, MD Taking Active   loratadine (CLARITIN) 10 MG tablet [id number] Yes Take 1 tablet (10 mg total) by mouth daily. Venia Carbon, MD Taking Active   meloxicam [facility]) 7.5 MG tablet [id number] Yes TAKE 1 TABLET BY MOUTH  DAILY Venia Carbon, MD Taking Active   montelukast (SINGULAIR) 10 MG tablet [id number] Yes TAKE 1 TABLET BY MOUTH AT  BEDTIME Venia Carbon, MD Taking Active   Multiple Vitamin (MULTIVITAMIN) capsule [id number] Yes Take 1 capsule by mouth daily. [provider] Taking Active   omeprazole (PRILOSEC) 40 MG capsule [id number] Yes TAKE 1 CAPSULE BY MOUTH  DAILY Venia Carbon, MD Taking Active   Potassium Gluconate 2.5 MEQ TABS [id number] Yes Take by mouth daily. [provider] Taking Active   senna-docusate (SENOKOT-S) 8.6-50 MG tablet [id number] Yes Take 2 tablets by mouth at bedtime. [provider] Taking Active   tamsulosin (FLOMAX) 0.4 MG CAPS capsule [id number] Yes TAKE 1 CAPSULE BY MOUTH  DAILY Venia Carbon, MD Taking Active   warfarin (COUMADIN) 5 MG tablet [id number] Yes TAKE 1 TABLET BY MOUTH DAILY  EXCEPT 1 AND 1/2  TABLETS BY  MOUTH ON MONDAYS OR AS DIRECTED  BY COUMADIN CLINIC Venia Carbon, MD Taking Active             Patient Active Problem List   Diagnosis Date Noted   Gastroesophageal reflux disease 03/23/2022   Epigastric abdominal pain 09/29/2021   Asthmatic bronchitis  11/26/2019   Seborrheic keratoses 09/05/2017   Advance directive discussed with patient 12/31/2014   Encounter for therapeutic drug monitoring 01/13/2014   Back pain 05/15/2013   Actinic keratoses 12/04/2012   BPH without obstruction/lower urinary tract symptoms    Routine general medical examination at a health care facility 04/28/2011   Osteoarthrosis involving more than one site 02/18/2011   Degenerative disc disease, lumbar 05/04/2010   Mild mood disorder ([place]) 05/05/2009   Hyperlipidemia 02/19/2007   Essential hypertension 02/19/2007   Atrial fibrillation ([place]) 02/19/2007   ALLERGIC RHINITIS 02/19/2007   DIVERTICULOSIS, COLON 02/19/2007   ERECTILE DYSFUNCTION, 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    Immunization History  Administered Date(s) Administered   Fluad Quad(high Dose 65+) 08/15/2019, 09/03/2020, 08/27/2021   Influenza Split 09/16/2011   Influenza Whole 10/21/2001, 11/05/2007, 09/01/2008, 11/02/2009, 08/18/2010   Influenza,inj,Quad PF,6+ Mos 09/09/2013, 10/13/2014, 09/22/2015, 08/29/2016, 09/05/2017, 10/25/2018   PFIZER(Purple Top)SARS-COV-2 Vaccination 12/24/2019, 01/14/2020, 08/31/2020, 09/01/2021   Pneumococcal Conjugate-13 12/31/2014   Pneumococcal Polysaccharide-23 04/28/2011, 01/16/2018   Tdap 04/28/2011   Zoster, Live 04/28/2011    Conditions to be addressed/monitored:  Hypertension, Hyperlipidemia, Atrial Fibrillation, and Osteoarthritis  Care Plan : [place]  Updates made by Charlton Haws, [place] since 07/08/2022 12:00 AM     Problem: Hypertension, Hyperlipidemia, Atrial Fibrillation, and Osteoarthritis      Long-Range Goal: Disease Management   Start Date: 07/05/2021  Expected End Date: 07/08/2022  This Visit's Progress: On track  Priority: High  Note:   Current Barriers:  None identified  Pharmacist Clinical Goal(s):  Patient will contact provider office for questions/concerns as evidenced notation of same in electronic health record through  collaboration with PharmD and provider.   Interventions: 1:1 collaboration with Venia Carbon, MD regarding development and update of comprehensive plan of care as evidenced by provider attestation and co-signature Inter-disciplinary care team collaboration (see longitudinal plan of care) Comprehensive medication review performed; medication list updated in electronic medical record  Hypertension (BP goal <140/90) -Controlled - per clinic and home readings -Current home readings: 120-130/60s (checks once a week); HR 60s -Current treatment: HCTZ 25 mg daily - Appropriate, Effective, Safe, Accessible -Medications previously tried: none   -Educated on BP goals and benefits of medications for prevention of heart attack, stroke and kidney damage; -Counseled to monitor BP at home periodically -Recommended to continue current medication  Hyperlipidemia: (LDL goal < 100) -Controlled - LDL 71 (03/2022) at goal -Current treatment: Atorvastatin 10 mg daily - Appropriate, Effective, Safe, Accessible -Medications previously tried: Simvastatin -Educated on Cholesterol goals;  -Recommended to continue current medication  Atrial Fibrillation (Goal: prevent stroke and major bleeding) -Controlled - followed by cardiology, no symptoms  -INR followed by [facility] -CHADSVASC: 3; s/p DC cardioversion (unsuccessful) -Current treatment: Warfarin 5 mg as directed - Appropriate, Effective, Safe, Accessible -Medications previously tried: atenolol (bradycardia) -Counseled on bleeding risk associated with NSAID and importance of self-monitoring for signs/symptoms of bleeding- He is prescribed Meloxicam 7.5 mg daily for arthritis. He is aware of risks. No s/s of bleeding today. He avoids all OTC NSAIDs. -Recommended to continue current medication  BPH (Goal: Symtpom control) -Controlled -  per patient report  -Current treatment  Tamsulosin 0.4 mg daily -Appropriate, Effective, Safe, Accessible -Medications  previously tried: none -No nocturia, no daytime symptoms   -Recommended to continue current medication  Mild Mood Disorder (Goal: Pt reported - Improve sleep) -Controlled -  per patient report he cannot sleep without xanax, takes 1 every night. No next day grogginess. Feels well rested next day. -Current treatment  Alprazolam 1 mg HS -Appropriate, Effective, Safe, Accessible -Medications previously tried: none -Recommended to continue current medication  GERD (Goal: Improve symptoms) -Controlled -  per patient report -Current treatment  Omeprazole 40 mg daily HS - Appropriate, Effective, Safe, Accessible -Medications previously tried: none -Symptoms most controlled. Avoids trigger foods. -Recommended to continue current medication  Allergies (Goal: Improve symptoms) -Controlled -  per patient report -States he has year-around allergies, it does worsen in Spring. Prior saw allergy specialist. Currently managed by Dr. Silvio Pate. Denies SOB.  -Current treatment  Loratadine 10 mg daily - Appropriate, Effective, Safe, Accessible Montelukast 10 mg daily HS - Appropriate, Effective, Safe, Accessible Flonase -Appropriate, Effective, Safe, Accessible Azestaline 137 mcg -Appropriate, Effective, Safe, Accessible -Medications previously tried: none -Symptoms most controlled. Avoids trigger foods. -Recommended to continue current medication  Back pain  (Goal: manage pain) -Controlled -Hx degenerative disc disease; follows with phys med (Dr Sharlet Salina) -Current treatment  Gabapentin 300 mg - 1 AM, 2 PM Appropriate, Effective, Safe, Accessible Meloxicam 7.5 mg daily -Appropriate, Effective, Query Safe -Medications previously tried: oxycodone  -Reviewed bleeding risk with meloxicam and warfarin; pt aware of risk and for him benefits > risk -Recommended to continue current medication  Health Maintenance -Vaccine gaps: Shingrix, TDAP  Patient Goals/Self-Care Activities Patient will:  - take  medications as prescribed as evidenced by patient report and record review focus on medication adherence by pill box check blood pressure periodically, document, and provide at future appointments       Medication Assistance: None required.  Patient affirms current coverage meets needs.  Compliance/Adherence/Medication fill history: Care Gaps: None  Star-Rating Drugs: Atorvastatin - PDC 100%  Medication Access: Within the past 30 days, how often has patient missed a dose of medication? 0 Is a pillbox or other method used to improve adherence? Yes  Factors that may affect medication adherence? no barriers identified Are meds synced by current pharmacy? No  Are meds delivered by current pharmacy? Yes  Does patient experience delays in picking up medications due to transportation concerns? No   [facility] Services Reviewed: Is patient disadvantaged to use [facility]?: Yes  Current Rx insurance plan: [facility] [place] Name and location of Current pharmacy:  [facility] ([facility] - [place], [address] [place] [address] Phone: [phone] Fax: [phone]  [place], [place] - [address] [place] [address] Phone: [phone] Fax: [phone]  [facility] services reviewed with patient today?: No  Patient requests to transfer care to [facility]?: No  Reason patient declined to change pharmacies: Disadvantaged due to insurance/mail order   Care Plan and Follow Up Patient Decision:  Patient agrees to Care Plan and Follow-up.  Plan: The patient has been provided with contact information for the care management team and has been advised to call with any health related questions or concerns.   Charlene Brooke, PharmD, BCACP Clinical Pharmacist [facility] [phone]

## 2022-07-08 NOTE — Patient Instructions (Addendum)
Visit Information  Phone number for Pharmacist: [phone]   Goals Addressed   None     Care Plan : [place]  Updates made by Charlton Haws, RPH since 07/08/2022 12:00 AM     Problem: Hypertension, Hyperlipidemia, Atrial Fibrillation, and Osteoarthritis      Long-Range Goal: Disease Management   Start Date: 07/05/2021  Expected End Date: 07/08/2022  This Visit's Progress: On track  Priority: High  Note:   Current Barriers:  None identified  Pharmacist Clinical Goal(s):  Patient will contact provider office for questions/concerns as evidenced notation of same in electronic health record through collaboration with PharmD and provider.   Interventions: 1:1 collaboration with Venia Carbon, MD regarding development and update of comprehensive plan of care as evidenced by provider attestation and co-signature Inter-disciplinary care team collaboration (see longitudinal plan of care) Comprehensive medication review performed; medication list updated in electronic medical record  Hypertension (BP goal <140/90) -Controlled - per clinic and home readings -Current home readings: 120-130/60s (checks once a week); HR 60s -Current treatment: HCTZ 25 mg daily - Appropriate, Effective, Safe, Accessible -Medications previously tried: none   -Educated on BP goals and benefits of medications for prevention of heart attack, stroke and kidney damage; -Counseled to monitor BP at home periodically -Recommended to continue current medication  Hyperlipidemia: (LDL goal < 100) -Controlled - LDL 71 (03/2022) at goal -Current treatment: Atorvastatin 10 mg daily - Appropriate, Effective, Safe, Accessible -Medications previously tried: Simvastatin -Educated on Cholesterol goals;  -Recommended to continue current medication  Atrial Fibrillation (Goal: prevent stroke and major bleeding) -Controlled - followed by cardiology, no symptoms  -INR followed by [facility] -CHADSVASC:  3; s/p DC cardioversion (unsuccessful) -Current treatment: Warfarin 5 mg as directed - Appropriate, Effective, Safe, Accessible -Medications previously tried: atenolol (bradycardia) -Counseled on bleeding risk associated with NSAID and importance of self-monitoring for signs/symptoms of bleeding- He is prescribed Meloxicam 7.5 mg daily for arthritis. He is aware of risks. No s/s of bleeding today. He avoids all OTC NSAIDs. -Recommended to continue current medication  BPH (Goal: Symtpom control) -Controlled -  per patient report  -Current treatment  Tamsulosin 0.4 mg daily -Appropriate, Effective, Safe, Accessible -Medications previously tried: none -No nocturia, no daytime symptoms   -Recommended to continue current medication  Mild Mood Disorder (Goal: Pt reported - Improve sleep) -Controlled -  per patient report he cannot sleep without xanax, takes 1 every night. No next day grogginess. Feels well rested next day. -Current treatment  Alprazolam 1 mg HS -Appropriate, Effective, Safe, Accessible -Medications previously tried: none -Recommended to continue current medication  GERD (Goal: Improve symptoms) -Controlled -  per patient report -Current treatment  Omeprazole 40 mg daily HS - Appropriate, Effective, Safe, Accessible -Medications previously tried: none -Symptoms most controlled. Avoids trigger foods. -Recommended to continue current medication  Allergies (Goal: Improve symptoms) -Controlled -  per patient report -States he has year-around allergies, it does worsen in Spring. Prior saw allergy specialist. Currently managed by Dr. Silvio Pate. Denies SOB.  -Current treatment  Loratadine 10 mg daily - Appropriate, Effective, Safe, Accessible Montelukast 10 mg daily HS - Appropriate, Effective, Safe, Accessible Flonase -Appropriate, Effective, Safe, Accessible Azestaline 137 mcg -Appropriate, Effective, Safe, Accessible -Medications previously tried: none -Symptoms most  controlled. Avoids trigger foods. -Recommended to continue current medication  Back pain  (Goal: manage pain) -Controlled -Hx degenerative disc disease; follows with phys med (Dr Sharlet Salina) -Current treatment  Gabapentin 300 mg - 1 AM, 2 PM Appropriate, Effective,  Safe, Accessible Meloxicam 7.5 mg daily -Appropriate, Effective, Query Safe -Medications previously tried: oxycodone  -Reviewed bleeding risk with meloxicam and warfarin; pt aware of risk and for him benefits > risk -Recommended to continue current medication  Health Maintenance -Vaccine gaps: Shingrix, TDAP  Patient Goals/Self-Care Activities Patient will:  - take medications as prescribed as evidenced by patient report and record review focus on medication adherence by pill box check blood pressure periodically, document, and provide at future appointments       The patient verbalized understanding of instructions, educational materials, and care plan provided today and DECLINED offer to receive copy of patient instructions, educational materials, and care plan.  The patient has been provided with contact information for the care management team and has been advised to call with any health related questions or concerns.   Charlene Brooke, PharmD, BCACP Clinical Pharmacist [facility] at [facility] [phone]

## 2022-08-18 ENCOUNTER — Ambulatory Visit (INDEPENDENT_AMBULATORY_CARE_PROVIDER_SITE_OTHER): Payer: Medicare Other

## 2022-08-18 DIAGNOSIS — Z5181 Encounter for therapeutic drug level monitoring: Secondary | ICD-10-CM

## 2022-08-18 DIAGNOSIS — I4819 Other persistent atrial fibrillation: Secondary | ICD-10-CM

## 2022-08-18 DIAGNOSIS — Z7901 Long term (current) use of anticoagulants: Secondary | ICD-10-CM | POA: Diagnosis not present

## 2022-08-18 LAB — POCT INR: INR: 2.3 (ref 2.0–3.0)

## 2022-08-18 NOTE — Progress Notes (Signed)
Continue 1 tablet daily except take 1 1/2 tablets on Mondays. Recheck in  6 weeks.  

## 2022-08-18 NOTE — Patient Instructions (Signed)
Continue 1 tablet daily except take 1 1/2 tablets on Mondays. Recheck in  6 weeks.  

## 2022-09-29 ENCOUNTER — Other Ambulatory Visit: Payer: Self-pay | Admitting: Internal Medicine

## 2022-09-29 ENCOUNTER — Ambulatory Visit (INDEPENDENT_AMBULATORY_CARE_PROVIDER_SITE_OTHER): Payer: Medicare Other

## 2022-09-29 DIAGNOSIS — Z7901 Long term (current) use of anticoagulants: Secondary | ICD-10-CM | POA: Diagnosis not present

## 2022-09-29 LAB — POCT INR: INR: 2.4 (ref 2.0–3.0)

## 2022-09-29 NOTE — Progress Notes (Signed)
Continue 1 tablet daily except take 1 1/2 tablets on Mondays. Recheck in  6 weeks.

## 2022-09-29 NOTE — Patient Instructions (Signed)
Continue 1 tablet daily except take 1 1/2 tablets on Mondays. Recheck in  6 weeks.

## 2022-11-10 ENCOUNTER — Ambulatory Visit (INDEPENDENT_AMBULATORY_CARE_PROVIDER_SITE_OTHER): Payer: Medicare Other

## 2022-11-10 DIAGNOSIS — Z7901 Long term (current) use of anticoagulants: Secondary | ICD-10-CM | POA: Diagnosis not present

## 2022-11-10 DIAGNOSIS — I4819 Other persistent atrial fibrillation: Secondary | ICD-10-CM

## 2022-11-10 DIAGNOSIS — Z23 Encounter for immunization: Secondary | ICD-10-CM

## 2022-11-10 DIAGNOSIS — Z5181 Encounter for therapeutic drug level monitoring: Secondary | ICD-10-CM

## 2022-11-10 LAB — POCT INR: INR: 2.3 (ref 2.0–3.0)

## 2022-11-10 NOTE — Patient Instructions (Addendum)
Pre visit review using our clinic review tool, if applicable. No additional management support is needed unless otherwise documented below in the visit note.  Continue 1 tablet daily except take 1 1/2 tablets on Mondays. Recheck in  6 weeks.

## 2022-11-10 NOTE — Progress Notes (Signed)
Continue 1 tablet daily except take 1 1/2 tablets on Mondays. Recheck in  6 weeks.   Pt requested flu vaccine. Administered high dose flu vaccine. Pt tolerated well.

## 2022-11-30 ENCOUNTER — Other Ambulatory Visit: Payer: Self-pay | Admitting: Internal Medicine

## 2022-11-30 DIAGNOSIS — Z7901 Long term (current) use of anticoagulants: Secondary | ICD-10-CM

## 2022-12-01 NOTE — Telephone Encounter (Signed)
Pt compliant with warfarin management and PCP apts.  Sent in refill

## 2022-12-08 ENCOUNTER — Other Ambulatory Visit: Payer: Self-pay | Admitting: Internal Medicine

## 2022-12-15 ENCOUNTER — Other Ambulatory Visit: Payer: Self-pay | Admitting: Internal Medicine

## 2022-12-15 NOTE — Telephone Encounter (Signed)
Last filled 07-05-22 #[id number] Last OV 05-31-22 Next OV 03-28-23 [facility]

## 2022-12-22 ENCOUNTER — Ambulatory Visit (INDEPENDENT_AMBULATORY_CARE_PROVIDER_SITE_OTHER): Payer: Medicare Other

## 2022-12-22 DIAGNOSIS — Z7901 Long term (current) use of anticoagulants: Secondary | ICD-10-CM

## 2022-12-22 LAB — POCT INR: INR: 2.6 (ref 2.0–3.0)

## 2022-12-22 NOTE — Patient Instructions (Addendum)
Pre visit review using our clinic review tool, if applicable. No additional management support is needed unless otherwise documented below in the visit note.  Continue 1 tablet daily except take 1 1/2 tablets on Mondays. Recheck in  6 weeks.

## 2022-12-22 NOTE — Progress Notes (Signed)
Continue 1 tablet daily except take 1 1/2 tablets on Mondays. Recheck in  6 weeks.

## 2023-02-02 ENCOUNTER — Ambulatory Visit (INDEPENDENT_AMBULATORY_CARE_PROVIDER_SITE_OTHER): Payer: Medicare Other

## 2023-02-02 DIAGNOSIS — Z7901 Long term (current) use of anticoagulants: Secondary | ICD-10-CM

## 2023-02-02 LAB — POCT INR: INR: 3.7 — AB (ref 2.0–3.0)

## 2023-02-02 NOTE — Patient Instructions (Addendum)
Pre visit review using our clinic review tool, if applicable. No additional management support is needed unless otherwise documented below in the visit note.  Hold dose today and the continue 1 tablet daily except take 1 1/2 tablets on Mondays. Recheck in 3 weeks.

## 2023-02-02 NOTE — Progress Notes (Signed)
Pt took double dose recently. Hold dose today and the continue 1 tablet daily except take 1 1/2 tablets on Mondays. Recheck in 3  weeks.

## 2023-02-23 ENCOUNTER — Ambulatory Visit (INDEPENDENT_AMBULATORY_CARE_PROVIDER_SITE_OTHER): Payer: Medicare Other

## 2023-02-23 DIAGNOSIS — Z7901 Long term (current) use of anticoagulants: Secondary | ICD-10-CM

## 2023-02-23 LAB — POCT INR: INR: 2.5 (ref 2.0–3.0)

## 2023-02-23 NOTE — Progress Notes (Signed)
Continue 1 tablet daily except take 1 1/2 tablets on Mondays. Recheck in  6 weeks.  

## 2023-02-23 NOTE — Patient Instructions (Addendum)
Pre visit review using our clinic review tool, if applicable. No additional management support is needed unless otherwise documented below in the visit note.  Continue 1 tablet daily except take 1 1/2 tablets on Mondays. Recheck in  6 weeks.  

## 2023-03-15 DIAGNOSIS — Z7901 Long term (current) use of anticoagulants: Secondary | ICD-10-CM | POA: Diagnosis not present

## 2023-03-16 DIAGNOSIS — M47816 Spondylosis without myelopathy or radiculopathy, lumbar region: Secondary | ICD-10-CM | POA: Diagnosis not present

## 2023-03-27 ENCOUNTER — Telehealth: Payer: Self-pay

## 2023-03-27 NOTE — Telephone Encounter (Signed)
Pt reports he will be out of town next week and will not be able to make coumadin clinic apt. He reports he has PCP apt tomorrow and requested INR check at that apt. Advised a msg will be sent to Terri, lab phlebotomist, requesting POCT INR if she is available. If no one available to perform POCT, then a lab INR can be drawn. Advised this nurse will contact him tomorrow after result is received. Pt appreciative and verbalized understanding.   Made note on Dr. Alphonsus Sias schedule for need of POCT INR or lab INR.

## 2023-03-28 ENCOUNTER — Encounter: Payer: Self-pay | Admitting: Internal Medicine

## 2023-03-28 ENCOUNTER — Ambulatory Visit (INDEPENDENT_AMBULATORY_CARE_PROVIDER_SITE_OTHER): Payer: Medicare Other | Admitting: Internal Medicine

## 2023-03-28 ENCOUNTER — Ambulatory Visit (INDEPENDENT_AMBULATORY_CARE_PROVIDER_SITE_OTHER): Payer: Medicare Other

## 2023-03-28 VITALS — BP 124/70 | HR 58 | Temp 97.7°F | Ht 65.0 in | Wt 181.0 lb

## 2023-03-28 DIAGNOSIS — L570 Actinic keratosis: Secondary | ICD-10-CM

## 2023-03-28 DIAGNOSIS — Z7901 Long term (current) use of anticoagulants: Secondary | ICD-10-CM | POA: Diagnosis not present

## 2023-03-28 DIAGNOSIS — I10 Essential (primary) hypertension: Secondary | ICD-10-CM | POA: Diagnosis not present

## 2023-03-28 DIAGNOSIS — Z0000 Encounter for general adult medical examination without abnormal findings: Secondary | ICD-10-CM

## 2023-03-28 DIAGNOSIS — K219 Gastro-esophageal reflux disease without esophagitis: Secondary | ICD-10-CM

## 2023-03-28 DIAGNOSIS — N400 Enlarged prostate without lower urinary tract symptoms: Secondary | ICD-10-CM | POA: Diagnosis not present

## 2023-03-28 DIAGNOSIS — M159 Polyosteoarthritis, unspecified: Secondary | ICD-10-CM

## 2023-03-28 DIAGNOSIS — I482 Chronic atrial fibrillation, unspecified: Secondary | ICD-10-CM

## 2023-03-28 DIAGNOSIS — M5136 Other intervertebral disc degeneration, lumbar region: Secondary | ICD-10-CM | POA: Diagnosis not present

## 2023-03-28 LAB — CBC
HCT: 41.5 % (ref 39.0–52.0)
Hemoglobin: 14.3 g/dL (ref 13.0–17.0)
MCHC: 34.3 g/dL (ref 30.0–36.0)
MCV: 91.3 fl (ref 78.0–100.0)
Platelets: 221 10*3/uL (ref 150.0–400.0)
RBC: 4.55 Mil/uL (ref 4.22–5.81)
RDW: 13.2 % (ref 11.5–15.5)
WBC: 6.6 10*3/uL (ref 4.0–10.5)

## 2023-03-28 LAB — LIPID PANEL
Cholesterol: 129 mg/dL (ref 0–200)
HDL: 44 mg/dL (ref >39.00)
LDL Cholesterol: 66 mg/dL (ref 0–99)
NonHDL: 84.56
Total CHOL/HDL Ratio: 3
Triglycerides: 94 mg/dL (ref 0.0–149.0)
VLDL: 18.8 mg/dL (ref 0.0–40.0)

## 2023-03-28 LAB — COMPREHENSIVE METABOLIC PANEL
ALT: 22 U/L (ref 0–53)
AST: 30 U/L (ref 0–37)
Albumin: 4.2 g/dL (ref 3.5–5.2)
Alkaline Phosphatase: 88 U/L (ref 39–117)
BUN: 16 mg/dL (ref 6–23)
CO2: 31 mEq/L (ref 19–32)
Calcium: 9.2 mg/dL (ref 8.4–10.5)
Chloride: 98 mEq/L (ref 96–112)
Creatinine, Ser: 0.76 mg/dL (ref 0.40–1.50)
GFR: 81.49 mL/min (ref >60.00)
Glucose, Bld: 93 mg/dL (ref 70–99)
Potassium: 3.8 mEq/L (ref 3.5–5.1)
Sodium: 135 mEq/L (ref 135–145)
Total Bilirubin: 0.8 mg/dL (ref 0.2–1.2)
Total Protein: 6.8 g/dL (ref 6.0–8.3)

## 2023-03-28 LAB — POCT INR: INR: 2.1 (ref 2.0–3.0)

## 2023-03-28 NOTE — Progress Notes (Signed)
Pt had PCP apt today so no anticoagulation management charge was added. INR was checked at the office.  Continue 1 tablet daily except take 1 1/2 tablets on Mondays. Recheck in 6 weeks.   Contacted pt by phone with dosing instructions. Pt verbalized understanding.

## 2023-03-28 NOTE — Assessment & Plan Note (Signed)
Rate is controlled Continues on warfarin--checked today

## 2023-03-28 NOTE — Assessment & Plan Note (Signed)
I have personally reviewed the Medicare Annual Wellness questionnaire and have noted 1. The patient's medical and social history 2. Their use of alcohol, tobacco or illicit drugs 3. Their current medications and supplements 4. The patient's functional ability including ADL's, fall risks, home safety risks and hearing or visual             impairment. 5. Diet and physical activities 6. Evidence for depression or mood disorders  The patients weight, height, BMI and visual acuity have been recorded in the chart I have made referrals, counseling and provided education to the patient based review of the above and I have provided the pt with a written personalized care plan for preventive services.  I have provided you with a copy of your personalized plan for preventive services. Please take the time to review along with your updated medication list.  Done with cancer screening due to age Does exercise Discussed updated COVID vaccine--had one in the fall Needs Td at pharmacy Doesn't want new shingles vaccine Flu/RSV in the fall

## 2023-03-28 NOTE — Assessment & Plan Note (Signed)
Discussed options--he asks for cryotherapy and gave consent Liquid nitrogen 20 seconds x 2 to two lesions Tolerated well Discussed home care

## 2023-03-28 NOTE — Assessment & Plan Note (Signed)
BP Readings from Last 3 Encounters:  03/28/23 124/70  04/13/22 130/60  03/23/22 134/84   Controlled on the HCTZ 25mg  daily

## 2023-03-28 NOTE — Progress Notes (Signed)
Subjective:    Patient ID: Mark Padilla, male    DOB: [birth year], 88 y.o.   MRN: [id number]  HPI Here for Medicare wellness visit and follow up of chronic health conditions Reviewed advanced directives Reviewed other doctors---Dr Chasnis--physiatry, Dr Barbera Setters, Dr Gollan--cardiology, Dr Alverda Skeans, Dr Derald Macleod, Dr Kristeen Mans Has nerves in back burned by Dr Brita Romp to have helped some. No hospitalizations [facility]---overall walks 3-4 miles a day. Resistance limited due to arthritis, Also keeps tomato farm with underwater irrigation Several falls--usually balance issues. Discussed using a walking stick for balance Rare alcohol No tobacco Vision good  Hearing still poor- fair on right but worse on left No regular depression Independent with instrumental ADLs No sig memory issues--just name recall  Larey Seat 3 weeks ago--grabbed pole and twisted around----rolled ankle/twisted right foot. Didn't really hit ground Was sore all over---didn't notice swelling on top of foot--but still there now Has been able to walk  Ongoing severe joint issues--especially hands and shoulders Uses the meloxicam regularly  No palpitations No chest pain Does get DOE---slightly easier than years ago, but stable in the past year or so No dizziness or syncope No regular ankle swelling  No regular depressed mood Not anhedonic Uses xanax for sleep mostly---does get anxious/temperamental at times Mostly frustrated by limitations  Current Outpatient Medications on File Prior to Visit  Medication Sig Dispense Refill   ALPRAZolam (XANAX) 1 MG tablet TAKE 1 TABLET BY MOUTH TWICE  DAILY AS NEEDED 180 tablet 0   Ascorbic Acid (VITAMIN C) 500 MG tablet Take 500 mg by mouth daily.     atorvastatin (LIPITOR) 10 MG tablet TAKE 1 TABLET BY MOUTH DAILY 100 tablet 3   azelastine (ASTELIN) 0.1 % nasal spray USE TWO SPRAYS INTO BOTH NOSTRILS AT BEDTIME 30 mL 3   fluticasone  (FLONASE) 50 MCG/ACT nasal spray USE 2 SPRAYS IN BOTH  NOSTRILS DAILY 64 g 3   gabapentin (NEURONTIN) 300 MG capsule TAKE 1 CAPSULE BY MOUTH IN THE  MORNING AND 2 CAPSULES BY MOUTH  IN THE EVENING 300 capsule 3   hydrochlorothiazide (HYDRODIURIL) 25 MG tablet TAKE 1 TABLET BY MOUTH DAILY 100 tablet 3   loratadine (CLARITIN) 10 MG tablet Take 1 tablet (10 mg total) by mouth daily. 90 tablet 3   meloxicam (MOBIC) 7.5 MG tablet TAKE 1 TABLET BY MOUTH DAILY 100 tablet 3   montelukast (SINGULAIR) 10 MG tablet TAKE 1 TABLET BY MOUTH AT  BEDTIME 100 tablet 3   Multiple Vitamin (MULTIVITAMIN) capsule Take 1 capsule by mouth daily.     omeprazole (PRILOSEC) 40 MG capsule TAKE 1 CAPSULE BY MOUTH DAILY 100 capsule 3   Potassium Gluconate 2.5 MEQ TABS Take by mouth daily.     senna-docusate (SENOKOT-S) 8.6-50 MG tablet Take 2 tablets by mouth at bedtime.     tamsulosin (FLOMAX) 0.4 MG CAPS capsule TAKE 1 CAPSULE BY MOUTH DAILY 100 capsule 3   warfarin (COUMADIN) 5 MG tablet TAKE 1 TABLET BY MOUTH  DAILY EXCEPT 1 AND 1/2  TABLETS BY MOUTH ON MONDAYS OR AS DIRECTED  BY ANTICOAGULATION CLINIC 110 tablet 2   No current facility-administered medications on file prior to visit.    Allergies  Allergen Reactions   Simvastatin     Listlessness, fatigue   Mucinex D [Pseudoephedrine-Guaifenesin Er]    Pseudoephedrine     Other reaction(s): Unknown   Undecylenic Acid    Flexeril [Cyclobenzaprine Hcl] Anxiety   Metaxalone Anxiety  Skelaxin Anxiety    Past Medical History:  Diagnosis Date   Allergic rhinitis    Atrial fibrillation (HCC)    chronic persistent, failed DCCV x3 in 2000   BPH (benign prostatic hypertrophy)    Cervical disc disease    Diverticulitis    ED (erectile dysfunction)    GERD (gastroesophageal reflux disease)    Hyperlipidemia    Hypertension    Impaired fasting glucose    Lumbar disc disease    Osteoarthritis    Sleep disturbance     Past Surgical History:  Procedure  Laterality Date   CATARACT EXTRACTION, BILATERAL  2014   COLONOSCOPY  2013   COLONOSCOPY WITH PROPOFOL N/A 04/28/2020   Procedure: COLONOSCOPY WITH PROPOFOL;  Surgeon: Midge Minium, MD;  Location: [facility] ENDOSCOPY;  Service: Endoscopy;  Laterality: N/A;   HEMIARTHROPLASTY SHOULDER FRACTURE  10/2004   left   JOINT REPLACEMENT  4/12   Left hip--Dr Gavin Potters   KNEE ARTHROSCOPY  06/1998   right Gavin Potters)   LUMBAR LAMINECTOMY  06/10/13   [facility]   REPLACEMENT UNICONDYLAR JOINT KNEE  8/12   Dr Gavin Potters   SHOULDER SURGERY  1950's   dislocation   SHOULDER SURGERY  12/2004   right   THR--left  4/12   Dr Gavin Potters    Family History  Problem Relation Age of Onset   Stomach cancer Mother    Stroke Father    Colon polyps Brother    Colon cancer Neg Hx     Social History   Socioeconomic History   Marital status: Single    Spouse name: Not on file   Number of children: 2   Years of education: Not on file   Highest education level: Not on file  Occupational History   Occupation: retired, Administrator, Civil Service works part time [employer]: RETIRED  Tobacco Use   Smoking status: Former    Types: Cigarettes    Quit date: 11/21/1962    Years since quitting: 60.3    Passive exposure: Past   Smokeless tobacco: Never  Vaping Use   Vaping Use: Never used  Substance and Sexual Activity   Alcohol use: Yes    Alcohol/week: 0.0 standard drinks of alcohol    Comment: occasional   Drug use: No   Sexual activity: Not on file  Other Topics Concern   Not on file  Social History Narrative   Plans to do living will   Will designate daughter Agustin Cree as health care POA.    Would accept resuscitation but no prolonged artificial ventilation   Would not want tube feeds if cognitively unaware   Social Determinants of Health   Financial Resource Strain: Low Risk  (07/08/2022)   Overall Financial Resource Strain (CARDIA)    Difficulty of Paying Living Expenses: Not very hard  Food Insecurity: No  Food Insecurity (07/08/2022)   Hunger Vital Sign    Worried About Running Out of Food in the Last Year: Never true    Ran Out of Food in the Last Year: Never true  Transportation Needs: Not on file  Physical Activity: Not on file  Stress: Not on file  Social Connections: Not on file  Intimate Partner Violence: Not on file   Review of Systems Appetite is fine Weight is stable Sleeps okay mostly Wears seat belt Teeth are okay---full dentures (hasn't needed dentist) Has spot on left cheek for me to check--thinks it may need to be sprayed (also right temple) Rare heartburn--takes omeprazole daily.  No dysphagia Bowels doing fine off the narcotic. No blood    Objective:   Physical Exam Constitutional:      Appearance: Normal appearance.  HENT:     Mouth/Throat:     Pharynx: No oropharyngeal exudate or posterior oropharyngeal erythema.  Eyes:     Conjunctiva/sclera: Conjunctivae normal.     Pupils: Pupils are equal, round, and reactive to light.  Cardiovascular:     Rate and Rhythm: Normal rate. Rhythm irregular.     Pulses: Normal pulses.     Heart sounds:     No gallop.  Pulmonary:     Effort: Pulmonary effort is normal.     Breath sounds: Normal breath sounds. No wheezing or rales.  Abdominal:     Palpations: Abdomen is soft.     Tenderness: There is no abdominal tenderness.  Musculoskeletal:     Cervical back: Neck supple.     Right lower leg: No edema.     Left lower leg: No edema.  Lymphadenopathy:     Cervical: No cervical adenopathy.  Skin:    Findings: No lesion or rash.     Comments: Actinics at right temple and left cheek--near nose  Neurological:     General: No focal deficit present.     Mental Status: He is alert and oriented to person, place, and time.     Comments: Word naming --- 6 quickly, then froze Recall 3/3  Psychiatric:        Mood and Affect: Mood normal.        Behavior: Behavior normal.            Assessment & Plan:

## 2023-03-28 NOTE — Patient Instructions (Signed)
Pre visit review using our clinic review tool, if applicable. No additional management support is needed unless otherwise documented below in the visit note. 

## 2023-03-28 NOTE — Assessment & Plan Note (Signed)
Better since the rhizotomy

## 2023-03-28 NOTE — Assessment & Plan Note (Signed)
Voids okay on the tamsulosin 

## 2023-03-28 NOTE — Assessment & Plan Note (Signed)
Controlled on the omeprazole 40mg  daily

## 2023-03-28 NOTE — Assessment & Plan Note (Signed)
Uses the meloxicam regularly

## 2023-04-06 ENCOUNTER — Ambulatory Visit: Payer: Medicare Other

## 2023-05-11 ENCOUNTER — Ambulatory Visit: Payer: Medicare Other

## 2023-05-12 ENCOUNTER — Telehealth: Payer: Self-pay

## 2023-05-12 NOTE — Telephone Encounter (Signed)
Pt returned call to RS apt. RS for next week.

## 2023-05-12 NOTE — Telephone Encounter (Signed)
Pt NS coumadin clinic apt yesterday. Left HIPAA compliant VM for pt to return call to RS.

## 2023-05-18 ENCOUNTER — Ambulatory Visit (INDEPENDENT_AMBULATORY_CARE_PROVIDER_SITE_OTHER): Payer: Medicare Other

## 2023-05-18 DIAGNOSIS — Z7901 Long term (current) use of anticoagulants: Secondary | ICD-10-CM

## 2023-05-18 LAB — POCT INR: INR: 2 (ref 2.0–3.0)

## 2023-05-18 NOTE — Progress Notes (Signed)
Continue 1 tablet daily except take 1 1/2 tablets on Mondays. Recheck in  6 weeks.  

## 2023-05-18 NOTE — Patient Instructions (Addendum)
Pre visit review using our clinic review tool, if applicable. No additional management support is needed unless otherwise documented below in the visit note.  Continue 1 tablet daily except take 1 1/2 tablets on Mondays. Recheck in  6 weeks.  

## 2023-05-24 DIAGNOSIS — D225 Melanocytic nevi of trunk: Secondary | ICD-10-CM | POA: Diagnosis not present

## 2023-05-24 DIAGNOSIS — D2272 Melanocytic nevi of left lower limb, including hip: Secondary | ICD-10-CM | POA: Diagnosis not present

## 2023-05-24 DIAGNOSIS — Z85828 Personal history of other malignant neoplasm of skin: Secondary | ICD-10-CM | POA: Diagnosis not present

## 2023-05-24 DIAGNOSIS — D2262 Melanocytic nevi of left upper limb, including shoulder: Secondary | ICD-10-CM | POA: Diagnosis not present

## 2023-05-24 DIAGNOSIS — D2261 Melanocytic nevi of right upper limb, including shoulder: Secondary | ICD-10-CM | POA: Diagnosis not present

## 2023-05-24 DIAGNOSIS — L570 Actinic keratosis: Secondary | ICD-10-CM | POA: Diagnosis not present

## 2023-05-24 DIAGNOSIS — Z872 Personal history of diseases of the skin and subcutaneous tissue: Secondary | ICD-10-CM | POA: Diagnosis not present

## 2023-06-29 ENCOUNTER — Telehealth: Payer: Self-pay

## 2023-06-29 ENCOUNTER — Ambulatory Visit (INDEPENDENT_AMBULATORY_CARE_PROVIDER_SITE_OTHER): Payer: Medicare Other

## 2023-06-29 DIAGNOSIS — Z7901 Long term (current) use of anticoagulants: Secondary | ICD-10-CM

## 2023-06-29 LAB — POCT INR: INR: 2.8 (ref 2.0–3.0)

## 2023-06-29 MED ORDER — ALPRAZOLAM 1 MG PO TABS: 1.0000 mg | ORAL_TABLET | Freq: Two times a day (BID) | ORAL | 0 refills | Status: DC | PRN

## 2023-06-29 NOTE — Telephone Encounter (Signed)
Refill sent.

## 2023-06-29 NOTE — Telephone Encounter (Signed)
Pt in today for INR check and reported he needs refill of xanax. Sending msg to provider. Pt wants it sent to [facility]  Last fill 12/15/22, #180, 0 RF

## 2023-06-29 NOTE — Progress Notes (Signed)
Continue 1 tablet daily except take 1 1/2 tablets on Mondays. Recheck in  6 weeks.

## 2023-06-29 NOTE — Patient Instructions (Addendum)
Pre visit review using our clinic review tool, if applicable. No additional management support is needed unless otherwise documented below in the visit note.  Continue 1 tablet daily except take 1 1/2 tablets on Mondays. Recheck in  6 weeks.  

## 2023-08-02 DIAGNOSIS — J019 Acute sinusitis, unspecified: Secondary | ICD-10-CM | POA: Diagnosis not present

## 2023-08-02 DIAGNOSIS — J029 Acute pharyngitis, unspecified: Secondary | ICD-10-CM | POA: Diagnosis not present

## 2023-08-02 DIAGNOSIS — Z03818 Encounter for observation for suspected exposure to other biological agents ruled out: Secondary | ICD-10-CM | POA: Diagnosis not present

## 2023-08-02 DIAGNOSIS — J040 Acute laryngitis: Secondary | ICD-10-CM | POA: Diagnosis not present

## 2023-08-02 DIAGNOSIS — B9689 Other specified bacterial agents as the cause of diseases classified elsewhere: Secondary | ICD-10-CM | POA: Diagnosis not present

## 2023-08-10 ENCOUNTER — Ambulatory Visit (INDEPENDENT_AMBULATORY_CARE_PROVIDER_SITE_OTHER): Payer: Medicare Other

## 2023-08-10 DIAGNOSIS — Z7901 Long term (current) use of anticoagulants: Secondary | ICD-10-CM

## 2023-08-10 LAB — POCT INR: INR: 2.7 (ref 2.0–3.0)

## 2023-08-10 NOTE — Progress Notes (Signed)
Continue 1 tablet daily except take 1 1/2 tablets on Mondays. Recheck in  6 weeks.

## 2023-08-10 NOTE — Patient Instructions (Addendum)
Pre visit review using our clinic review tool, if applicable. No additional management support is needed unless otherwise documented below in the visit note.  Continue 1 tablet daily except take 1 1/2 tablets on Mondays. Recheck in  6 weeks.

## 2023-08-15 ENCOUNTER — Other Ambulatory Visit: Payer: Self-pay | Admitting: Internal Medicine

## 2023-08-15 DIAGNOSIS — Z7901 Long term (current) use of anticoagulants: Secondary | ICD-10-CM

## 2023-08-16 NOTE — Telephone Encounter (Signed)
Pt is compliant with warfarin management and PCP apts.  Sent in refill of warfarin to requested pharmacy.

## 2023-09-14 ENCOUNTER — Other Ambulatory Visit: Payer: Self-pay | Admitting: Internal Medicine

## 2023-09-14 NOTE — Telephone Encounter (Signed)
Forwarding to Dr Alphonsus Sias for the alprazolam and the meloxicam (he is on warfarin)

## 2023-09-21 ENCOUNTER — Ambulatory Visit (INDEPENDENT_AMBULATORY_CARE_PROVIDER_SITE_OTHER): Payer: Medicare Other

## 2023-09-21 DIAGNOSIS — Z7901 Long term (current) use of anticoagulants: Secondary | ICD-10-CM | POA: Diagnosis not present

## 2023-09-21 LAB — POCT INR: INR: 2.9 (ref 2.0–3.0)

## 2023-09-21 NOTE — Patient Instructions (Addendum)
Pre visit review using our clinic review tool, if applicable. No additional management support is needed unless otherwise documented below in the visit note.  Continue 1 tablet daily except take 1 1/2 tablets on Mondays. Recheck in  6 weeks.  

## 2023-09-21 NOTE — Progress Notes (Signed)
Continue 1 tablet daily except take 1 1/2 tablets on Mondays. Recheck in  6 weeks.

## 2023-10-24 ENCOUNTER — Other Ambulatory Visit: Payer: Self-pay | Admitting: Internal Medicine

## 2023-10-24 DIAGNOSIS — Z7901 Long term (current) use of anticoagulants: Secondary | ICD-10-CM

## 2023-10-25 NOTE — Telephone Encounter (Signed)
Pt is compliant with warfarin management and PCP apts.  Sent in refill of warfarin to requested pharmacy.      

## 2023-11-02 ENCOUNTER — Ambulatory Visit: Payer: Medicare Other

## 2023-11-02 DIAGNOSIS — Z7901 Long term (current) use of anticoagulants: Secondary | ICD-10-CM

## 2023-11-02 LAB — POCT INR: INR: 3.4 — AB (ref 2.0–3.0)

## 2023-11-02 NOTE — Progress Notes (Signed)
Pt eating less greens. Pt has been very stable on the current dose for over a year. Due to this no change in weekly dose will be made. Pt will increase greens in his diet. Pt already took warfarin today.  Reduce dose tomorrow to take 1/2 tablet and the continue 1 tablet daily except take 1 1/2 tablets on Mondays. Recheck in 6 weeks per pt request.

## 2023-11-02 NOTE — Patient Instructions (Addendum)
Pre visit review using our clinic review tool, if applicable. No additional management support is needed unless otherwise documented below in the visit note.  Reduce dose tomorrow to take 1/2 tablet and the continue 1 tablet daily except take 1 1/2 tablets on Mondays. Recheck in 6 weeks.

## 2023-11-29 ENCOUNTER — Other Ambulatory Visit: Payer: Self-pay | Admitting: Internal Medicine

## 2023-12-14 ENCOUNTER — Ambulatory Visit: Payer: Medicare Other

## 2023-12-14 DIAGNOSIS — Z7901 Long term (current) use of anticoagulants: Secondary | ICD-10-CM | POA: Diagnosis not present

## 2023-12-14 LAB — POCT INR: INR: 2.7 (ref 2.0–3.0)

## 2023-12-14 NOTE — Patient Instructions (Addendum)
Pre visit review using our clinic review tool, if applicable. No additional management support is needed unless otherwise documented below in the visit note.  Continue 1 tablet daily except take 1 1/2 tablets on Mondays. Recheck in  6 weeks.  

## 2023-12-14 NOTE — Progress Notes (Signed)
Continue 1 tablet daily except take 1 1/2 tablets on Mondays. Recheck in  6 weeks.

## 2023-12-15 NOTE — Progress Notes (Unsigned)
Cardiology Office Note  Date:  12/18/2023   ID:  Mark Padilla, DOB 1934-12-19, MRN [id number]  PCP:  Karie Schwalbe, MD   Chief Complaint  Patient presents with   12 month follow up     Patient c/o shortness of breath with exertion.     HPI:  89 year-old gentleman with PMH of  chronic atrial fibrillation on warfarin,  HTN,  DC cardioversion that was unsuccessful,  hyperlipidemia,  hip replacement on the left in April 2012,  right knee replacement August 2012 back surgery July 2014 with decompression previously on Zocor, Changed to Lipitor He presents for routine follow-up of his atrial fibrillation, SOB  LOV May 2023  Nightmare one night, kicking in bed, hurt left hip Detached muscle, no immediate plan for surgery  Severe b/l shoulder pain Thinking about having arthroscopic surgery, has not talked with orthopedics  [facility], 3-4 x a week Walking several miles a day, [phone] steps a day Lots of gardening during the summer, 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 Now living with his daughter  Chronic pain in his hands and shoulders, using meloxicam daily with gabapentin  Stomach better off omeprazole  INR in range   Chronic back pain, Had nerves "burned",   Prior hip replacement  Lab work reviewed Total chol 129 LDL 66  Denies chest pain concerning for angina  EKG personally reviewed by myself on todays visit EKG Interpretation Date/Time:  Monday December 18 2023 08:09:32 EST Ventricular Rate:  60 PR Interval:    QRS Duration:  120 QT Interval:  426 QTC Calculation: 426 R Axis:   21  Text Interpretation: Atrial fibrillation Low voltage QRS Septal infarct (cited on or before 20-Jan-2014) When compared with ECG of 20-Jan-2014 15:55, QRS duration has increased Confirmed by Julien Nordmann [phone]) on 12/18/2023 8:11:32 AM    PMH:   has a past medical history of Allergic rhinitis, Atrial fibrillation (HCC), BPH (benign prostatic hypertrophy), Cervical disc  disease, Diverticulitis, ED (erectile dysfunction), GERD (gastroesophageal reflux disease), Hyperlipidemia, Hypertension, Impaired fasting glucose, Lumbar disc disease, Osteoarthritis, and Sleep disturbance.  PSH:    Past Surgical History:  Procedure Laterality Date   CATARACT EXTRACTION, BILATERAL  2014   COLONOSCOPY  2013   COLONOSCOPY WITH PROPOFOL N/A 04/28/2020   Procedure: COLONOSCOPY WITH PROPOFOL;  Surgeon: Midge Minium, MD;  Location: [facility] END[facility]COPY;  Service: Endoscopy;  Laterality: N/A;   HEMIARTHROPLASTY SHOULDER FRACTURE  10/2004   left   JOINT REPLACEMENT  4/12   Left hip--Dr Gavin Potters   KNEE ARTHROSCOPY  06/1998   right Gavin Potters)   LUMBAR LAMINECTOMY  06/10/13   [facility]   REPLACEMENT UNICONDYLAR JOINT KNEE  8/12   Dr Gavin Potters   SHOULDER SURGERY  1950's   dislocation   SHOULDER SURGERY  12/2004   right   THR--left  4/12   Dr Gavin Potters    Current Outpatient Medications  Medication Sig Dispense Refill   ALPRAZolam (XANAX) 1 MG tablet TAKE 1 TABLET BY MOUTH TWICE  DAILY AS NEEDED 180 tablet 0   Ascorbic Acid (VITAMIN C) 500 MG tablet Take 500 mg by mouth daily.     atorvastatin (LIPITOR) 10 MG tablet TAKE 1 TABLET BY MOUTH DAILY 100 tablet 3   azelastine (ASTELIN) 0.1 % nasal spray USE TWO SPRAYS INTO BOTH NOSTRILS AT BEDTIME 30 mL 3   fluticasone (FLONASE) 50 MCG/ACT nasal spray USE 2 SPRAYS IN BOTH NOSTRILS  DAILY 64 g 3   fluticasone-salmeterol (ADVAIR) 250-50 MCG/ACT  AEPB Inhale 1 puff into the lungs 2 (two) times daily as needed.     gabapentin (NEURONTIN) 300 MG capsule TAKE 1 CAPSULE BY MOUTH IN THE  MORNING AND 2 CAPSULES BY MOUTH  IN THE EVENING 300 capsule 3   hydrochlorothiazide (HYDRODIURIL) 25 MG tablet TAKE 1 TABLET BY MOUTH DAILY 100 tablet 3   loratadine (CLARITIN) 10 MG tablet Take 1 tablet (10 mg total) by mouth daily. 90 tablet 3   meloxicam (MOBIC) 7.5 MG tablet TAKE 1 TABLET BY MOUTH DAILY 100 tablet 2   montelukast (SINGULAIR) 10 MG tablet TAKE 1  TABLET BY MOUTH AT  BEDTIME 100 tablet 3   Multiple Vitamin (MULTIVITAMIN) capsule Take 1 capsule by mouth daily.     Potassium Gluconate 2.5 MEQ TABS Take by mouth daily.     senna-docusate (SENOKOT-S) 8.6-50 MG tablet Take 2 tablets by mouth at bedtime.     tamsulosin (FLOMAX) 0.4 MG CAPS capsule TAKE 1 CAPSULE BY MOUTH DAILY 100 capsule 3   warfarin (COUMADIN) 5 MG tablet TAKE 1 TABLET BY MOUTH DAILY  EXCEPT 1 AND 1/2 TABLETS BY  MOUTH ON MONDAYS OR AS DIRECTED  BY ANTICOAGULATION CLINIC 123 tablet 1   No current facility-administered medications for this visit.    Allergies:   Simvastatin, Mucinex d [pseudoephedrine-guaifenesin er], Pseudoephedrine, Undecylenic acid, Flexeril [cyclobenzaprine hcl], Metaxalone, and Skelaxin   Social History:  The patient  reports that he quit smoking about 61 years ago. His smoking use included cigarettes. He has been exposed to tobacco smoke. He has never used smokeless tobacco. He reports current alcohol use. He reports that he does not use drugs.   Family History:   family history includes Colon polyps in his brother; Stomach cancer in his mother; Stroke in his father.    Review of Systems: Review of Systems  Constitutional: Negative.   HENT: Negative.    Respiratory: Negative.    Cardiovascular: Negative.   Gastrointestinal: Negative.   Musculoskeletal:  Positive for back pain and joint pain.  Neurological: Negative.   Psychiatric/Behavioral: Negative.    All other systems reviewed and are negative.   PHYSICAL EXAM: VS:  BP 130/72 (BP Location: Left Arm, Patient Position: Sitting, Cuff Size: Normal)   Pulse 60   Ht 5\' 7"  (1.702 m)   Wt 178 lb 8 oz (81 kg)   SpO2 98%   BMI 27.96 kg/m  , BMI Body mass index is 27.96 kg/m. Constitutional:  oriented to person, place, and time. No distress.  HENT:  Head: Grossly normal Eyes:  no discharge. No scleral icterus.  Neck: No JVD, no carotid bruits  Cardiovascular: Regular rate and rhythm, no  murmurs appreciated Pulmonary/Chest: Clear to auscultation bilaterally, no wheezes or rails Abdominal: Soft.  no distension.  no tenderness.  Musculoskeletal: Normal range of motion Neurological:  normal muscle tone. Coordination normal. No atrophy Skin: Skin warm and dry Psychiatric: normal affect, pleasant  Recent Labs: 03/28/2023: ALT 22; BUN 16; Creatinine, Ser 0.76; Hemoglobin 14.3; Platelets 221.0; Potassium 3.8; Sodium 135    Lipid Panel Lab Results  Component Value Date   CHOL 129 03/28/2023   HDL 44.00 03/28/2023   LDLCALC 66 03/28/2023   TRIG 94.0 03/28/2023      Wt Readings from Last 3 Encounters:  12/18/23 178 lb 8 oz (81 kg)  03/28/23 181 lb (82.1 kg)  04/13/22 191 lb 2 oz (86.7 kg)     ASSESSMENT AND PLAN:  Atrial fibrillation, unspecified type (HCC) - Plan:  EKG 12-Lead Rate controlled on no medication, on warfarin, INR within range atenolol held 2020 for bradycardia  Mixed hyperlipidemia Cholesterol is at goal on the current lipid regimen. No changes to the medications were made.  Chronic back pain laminectomy at [facility] Dr. Timoteo Ace  Followed by Dr. Yves Dill,  Exercising  Essential hypertension Blood pressure is well controlled on today's visit. No changes made to the medications.  Shoulder pain Thinking about having arthroscopic surgery given severity of his bilateral shoulder pain In preparation for possible surgery recommended he update his echocardiogram given last was in 2010   Orders Placed This Encounter  Procedures   EKG 12-Lead     Signed, Dossie Arbour, M.D., Ph.D. 12/18/2023  [facility] [place], [place] [phone]

## 2023-12-18 ENCOUNTER — Encounter: Payer: Self-pay | Admitting: Cardiovascular Disease

## 2023-12-18 ENCOUNTER — Ambulatory Visit: Payer: Medicare Other | Attending: Cardiovascular Disease | Admitting: Cardiovascular Disease

## 2023-12-18 VITALS — BP 130/72 | HR 60 | Ht 67.0 in | Wt 178.5 lb

## 2023-12-18 DIAGNOSIS — I10 Essential (primary) hypertension: Secondary | ICD-10-CM | POA: Diagnosis not present

## 2023-12-18 DIAGNOSIS — I4821 Permanent atrial fibrillation: Secondary | ICD-10-CM

## 2023-12-18 DIAGNOSIS — E782 Mixed hyperlipidemia: Secondary | ICD-10-CM

## 2023-12-18 NOTE — Patient Instructions (Addendum)
Medication Instructions:  No changes  If you need a refill on your cardiac medications before your next appointment, please call your pharmacy.   Lab work: No new labs needed  Testing/Procedures: Your physician has requested that you have an echocardiogram. Echocardiography is a painless test that uses sound waves to create images of your heart. It provides your doctor with information about the size and shape of your heart and how well your heart's chambers and valves are working.   You may receive an ultrasound enhancing agent through an IV if needed to better visualize your heart during the echo. This procedure takes approximately one hour.  There are no restrictions for this procedure.  This will take place at [address] [facility] [address]) [address], [place] [address]  Please note: We ask at that you not bring children with you during ultrasound (echo/ vascular) testing. Due to room size and safety concerns, children are not allowed in the ultrasound rooms during exams. Our front office staff cannot provide observation of children in our lobby area while testing is being conducted. An adult accompanying a patient to their appointment will only be allowed in the ultrasound room at the discretion of the ultrasound technician under special circumstances. We apologize for any inconvenience.   Follow-Up: At [facility], you and your health needs are our priority.  As part of our continuing mission to provide you with exceptional heart care, we have created designated Provider Care Teams.  These Care Teams include your primary Cardiologist (physician) and Advanced Practice Providers (APPs -  Physician Assistants and Nurse Practitioners) who all work together to provide you with the care you need, when you need it.  You will need a follow up appointment in 12 months  Providers on your designated Care Team:   Nicolasa Ducking, NP Eula Listen, PA-C Cadence Fransico Michael, [place]  COVID-19  Vaccine Information can be found at: [email or web address] For questions related to vaccine distribution or appointments, please email [email or web address] or call 5071822420.

## 2023-12-21 DIAGNOSIS — H905 Unspecified sensorineural hearing loss: Secondary | ICD-10-CM | POA: Diagnosis not present

## 2023-12-25 DIAGNOSIS — Z7901 Long term (current) use of anticoagulants: Secondary | ICD-10-CM | POA: Diagnosis not present

## 2023-12-26 DIAGNOSIS — M47816 Spondylosis without myelopathy or radiculopathy, lumbar region: Secondary | ICD-10-CM | POA: Diagnosis not present

## 2024-01-03 DIAGNOSIS — M25511 Pain in right shoulder: Secondary | ICD-10-CM | POA: Diagnosis not present

## 2024-01-03 DIAGNOSIS — Z96611 Presence of right artificial shoulder joint: Secondary | ICD-10-CM | POA: Diagnosis not present

## 2024-01-03 DIAGNOSIS — Z96612 Presence of left artificial shoulder joint: Secondary | ICD-10-CM | POA: Diagnosis not present

## 2024-01-03 DIAGNOSIS — M25512 Pain in left shoulder: Secondary | ICD-10-CM | POA: Diagnosis not present

## 2024-01-03 DIAGNOSIS — G8929 Other chronic pain: Secondary | ICD-10-CM | POA: Diagnosis not present

## 2024-01-09 ENCOUNTER — Ambulatory Visit: Payer: Medicare Other | Attending: Cardiovascular Disease

## 2024-01-09 DIAGNOSIS — I4821 Permanent atrial fibrillation: Secondary | ICD-10-CM | POA: Diagnosis not present

## 2024-01-09 LAB — ECHOCARDIOGRAM COMPLETE
AV Mean grad: 5 mm[Hg]
AV Peak grad: 10.1 mm[Hg]
Ao pk vel: 1.59 m/s
P 1/2 time: 614 ms
S' Lateral: 2 cm

## 2024-01-15 ENCOUNTER — Encounter: Payer: Self-pay | Admitting: Emergency Medicine

## 2024-01-25 ENCOUNTER — Ambulatory Visit: Payer: Medicare Other

## 2024-01-25 DIAGNOSIS — Z7901 Long term (current) use of anticoagulants: Secondary | ICD-10-CM | POA: Diagnosis not present

## 2024-01-25 LAB — POCT INR: INR: 2.6 (ref 2.0–3.0)

## 2024-01-25 NOTE — Patient Instructions (Addendum)
Pre visit review using our clinic review tool, if applicable. No additional management support is needed unless otherwise documented below in the visit note.  Continue 1 tablet daily except take 1 1/2 tablets on Mondays. Recheck in  6 weeks.  

## 2024-01-25 NOTE — Progress Notes (Addendum)
 Pt had procedure on his back, "burning of the nerves" about 3 weeks ago and he missed one dose for that and also missed one dose about 1 1/2 weeks ago. Continue 1 tablet daily except take 1 1/2 tablets on Mondays. Recheck in 6 weeks.

## 2024-03-07 ENCOUNTER — Ambulatory Visit (INDEPENDENT_AMBULATORY_CARE_PROVIDER_SITE_OTHER)

## 2024-03-07 DIAGNOSIS — Z7901 Long term (current) use of anticoagulants: Secondary | ICD-10-CM

## 2024-03-07 LAB — POCT INR: INR: 2.8 (ref 2.0–3.0)

## 2024-03-07 NOTE — Patient Instructions (Addendum)
 Pre visit review using our clinic review tool, if applicable. No additional management support is needed unless otherwise documented below in the visit note.  Continue 1 tablet daily except take 1 1/2 tablets on Mondays. Recheck in 7 weeks per pt request.

## 2024-03-07 NOTE — Progress Notes (Signed)
 Continue 1 tablet daily except take 1 1/2 tablets on Mondays. Recheck in 7 weeks per pt request.

## 2024-03-25 DIAGNOSIS — M19012 Primary osteoarthritis, left shoulder: Secondary | ICD-10-CM | POA: Diagnosis not present

## 2024-03-25 DIAGNOSIS — Z96612 Presence of left artificial shoulder joint: Secondary | ICD-10-CM | POA: Diagnosis not present

## 2024-03-25 DIAGNOSIS — Z96611 Presence of right artificial shoulder joint: Secondary | ICD-10-CM | POA: Diagnosis not present

## 2024-03-25 DIAGNOSIS — M19011 Primary osteoarthritis, right shoulder: Secondary | ICD-10-CM | POA: Diagnosis not present

## 2024-03-26 DIAGNOSIS — H26491 Other secondary cataract, right eye: Secondary | ICD-10-CM | POA: Diagnosis not present

## 2024-03-26 DIAGNOSIS — H524 Presbyopia: Secondary | ICD-10-CM | POA: Diagnosis not present

## 2024-03-26 DIAGNOSIS — H35361 Drusen (degenerative) of macula, right eye: Secondary | ICD-10-CM | POA: Diagnosis not present

## 2024-03-26 DIAGNOSIS — H04123 Dry eye syndrome of bilateral lacrimal glands: Secondary | ICD-10-CM | POA: Diagnosis not present

## 2024-03-26 DIAGNOSIS — H35031 Hypertensive retinopathy, right eye: Secondary | ICD-10-CM | POA: Diagnosis not present

## 2024-03-28 ENCOUNTER — Encounter: Payer: Self-pay | Admitting: Internal Medicine

## 2024-03-28 ENCOUNTER — Ambulatory Visit: Payer: Medicare Other | Admitting: Internal Medicine

## 2024-03-28 VITALS — BP 138/80 | HR 65 | Temp 97.5°F | Ht 65.5 in | Wt 170.0 lb

## 2024-03-28 DIAGNOSIS — I480 Paroxysmal atrial fibrillation: Secondary | ICD-10-CM

## 2024-03-28 DIAGNOSIS — I10 Essential (primary) hypertension: Secondary | ICD-10-CM

## 2024-03-28 DIAGNOSIS — M150 Primary generalized (osteo)arthritis: Secondary | ICD-10-CM | POA: Diagnosis not present

## 2024-03-28 DIAGNOSIS — Z0000 Encounter for general adult medical examination without abnormal findings: Secondary | ICD-10-CM

## 2024-03-28 DIAGNOSIS — F39 Unspecified mood [affective] disorder: Secondary | ICD-10-CM | POA: Diagnosis not present

## 2024-03-28 DIAGNOSIS — N400 Enlarged prostate without lower urinary tract symptoms: Secondary | ICD-10-CM

## 2024-03-28 LAB — LIPID PANEL
Cholesterol: 147 mg/dL (ref 0–200)
HDL: 50 mg/dL (ref >39.00)
LDL Cholesterol: 83 mg/dL (ref 0–99)
NonHDL: 96.92
Total CHOL/HDL Ratio: 3
Triglycerides: 72 mg/dL (ref 0.0–149.0)
VLDL: 14.4 mg/dL (ref 0.0–40.0)

## 2024-03-28 LAB — CBC
HCT: 41.9 % (ref 39.0–52.0)
Hemoglobin: 14.2 g/dL (ref 13.0–17.0)
MCHC: 33.8 g/dL (ref 30.0–36.0)
MCV: 92.1 fl (ref 78.0–100.0)
Platelets: 249 10*3/uL (ref 150.0–400.0)
RBC: 4.55 Mil/uL (ref 4.22–5.81)
RDW: 13.9 % (ref 11.5–15.5)
WBC: 8 10*3/uL (ref 4.0–10.5)

## 2024-03-28 LAB — COMPREHENSIVE METABOLIC PANEL WITH GFR
ALT: 24 U/L (ref 0–53)
AST: 30 U/L (ref 0–37)
Albumin: 4.6 g/dL (ref 3.5–5.2)
Alkaline Phosphatase: 78 U/L (ref 39–117)
BUN: 31 mg/dL — ABNORMAL HIGH (ref 6–23)
CO2: 28 meq/L (ref 19–32)
Calcium: 9.8 mg/dL (ref 8.4–10.5)
Chloride: 101 meq/L (ref 96–112)
Creatinine, Ser: 0.85 mg/dL (ref 0.40–1.50)
GFR: 78.23 mL/min (ref >60.00)
Glucose, Bld: 111 mg/dL — ABNORMAL HIGH (ref 70–99)
Potassium: 4 meq/L (ref 3.5–5.1)
Sodium: 137 meq/L (ref 135–145)
Total Bilirubin: 0.5 mg/dL (ref 0.2–1.2)
Total Protein: 7.2 g/dL (ref 6.0–8.3)

## 2024-03-28 MED ORDER — GABAPENTIN 300 MG PO CAPS
ORAL_CAPSULE | ORAL | 3 refills | Status: AC
Start: 2024-03-28 — End: ?

## 2024-03-28 MED ORDER — ALPRAZOLAM 1 MG PO TABS
1.0000 mg | ORAL_TABLET | Freq: Two times a day (BID) | ORAL | 0 refills | Status: DC | PRN
Start: 2024-03-28 — End: 2024-08-14

## 2024-03-28 NOTE — Assessment & Plan Note (Signed)
 Voids fair with the tamsulosin 

## 2024-03-28 NOTE — Assessment & Plan Note (Signed)
 Rate is fine On warfarin

## 2024-03-28 NOTE — Progress Notes (Signed)
 Subjective:    Patient ID: Mark Padilla, male    DOB: 02-20-1935, 89 y.o.   MRN: [id number]  HPI Here for Medicare wellness visit and follow up of chronic health conditions Reviewed advanced directives Reviewed other doctors---Dr Poggi-ortho, Dr Lucio Sabin, Dr Gollan--cardiology, Dr Pelkey--audiology, Dr Josue Nip, Dr Ritter--opto, Dr Margaree Shark, Dr Hecker's office--ophthal (has had laser in office) No hospitalizations or surgery in the past year [facility] and [facility]. Still has garden--tomatoes Vision is okay Hearing aides are okay Rare liquor No tobacco Fell twice this year---did get a bruise on his forehead Mild mood issues Independent with instrumental ADLs--does live with daughter now No memory issues other than names  Ongoing shoulder problems Recent cortisone injections haven't helped Grinds all the time--lots of pain with activity (arms over shoulders or behind him) Ongoing balance issues--has to use cane Taking arthritis tylenol  prn and daily meloxicam  Stomach is okay--but felt the omeprazole  was actually causing problems so only using prn  Has cut his gabapentin  dose Weaned off completely and regretted it Back to 300mg /600mg  Mostly for his back pain  Recent echo was fine No palpitations No chest pain  Breathing generally okay--but does have some limitations with breathing exercises No syncope. Some brief lightheadedness if he jumps up quick No edema  Urine will stop--then restart. Eventually seems to empty Nocturia only 0-1  Some mild mood issues related to physical limitations and pain Uses the xanax  nightly to help sleep--that calms him down  Current Outpatient Medications on File Prior to Visit  Medication Sig Dispense Refill   ALPRAZolam  (XANAX ) 1 MG tablet TAKE 1 TABLET BY MOUTH TWICE  DAILY AS NEEDED 180 tablet 0   Ascorbic Acid (VITAMIN C) 500 MG tablet Take 500 mg by mouth daily.     atorvastatin  (LIPITOR) 10 MG  tablet TAKE 1 TABLET BY MOUTH DAILY 100 tablet 3   azelastine  (ASTELIN ) 0.1 % nasal spray USE TWO SPRAYS INTO BOTH NOSTRILS AT BEDTIME 30 mL 3   fluticasone  (FLONASE ) 50 MCG/ACT nasal spray USE 2 SPRAYS IN BOTH NOSTRILS  DAILY 64 g 3   fluticasone -salmeterol (ADVAIR) 250-50 MCG/ACT AEPB Inhale 1 puff into the lungs 2 (two) times daily as needed.     gabapentin  (NEURONTIN ) 300 MG capsule TAKE 1 CAPSULE BY MOUTH IN THE  MORNING AND 2 CAPSULES BY MOUTH  IN THE EVENING 300 capsule 3   hydrochlorothiazide  (HYDRODIURIL ) 25 MG tablet TAKE 1 TABLET BY MOUTH DAILY 100 tablet 3   loratadine  (CLARITIN ) 10 MG tablet Take 1 tablet (10 mg total) by mouth daily. 90 tablet 3   meloxicam  (MOBIC ) 7.5 MG tablet TAKE 1 TABLET BY MOUTH DAILY 100 tablet 2   montelukast  (SINGULAIR ) 10 MG tablet TAKE 1 TABLET BY MOUTH AT  BEDTIME 100 tablet 3   Multiple Vitamin (MULTIVITAMIN) capsule Take 1 capsule by mouth daily.     Potassium Gluconate 2.5 MEQ TABS Take by mouth daily.     senna-docusate (SENOKOT-S) 8.6-50 MG tablet Take 2 tablets by mouth at bedtime.     tamsulosin  (FLOMAX ) 0.4 MG CAPS capsule TAKE 1 CAPSULE BY MOUTH DAILY 100 capsule 3   warfarin (COUMADIN ) 5 MG tablet TAKE 1 TABLET BY MOUTH DAILY  EXCEPT 1 AND 1/2 TABLETS BY  MOUTH ON MONDAYS OR AS DIRECTED  BY ANTICOAGULATION CLINIC 123 tablet 1   No current facility-administered medications on file prior to visit.    Allergies  Allergen Reactions   Simvastatin      Listlessness,  fatigue   Mucinex D [Pseudoephedrine-Guaifenesin Er]    Pseudoephedrine     Other reaction(s): Unknown   Undecylenic Acid    Flexeril [Cyclobenzaprine Hcl] Anxiety   Metaxalone Anxiety   Skelaxin Anxiety    Past Medical History:  Diagnosis Date   Allergic rhinitis    Atrial fibrillation (HCC)    chronic persistent, failed DCCV x3 in 2000   BPH (benign prostatic hypertrophy)    Cervical disc disease    Diverticulitis    ED (erectile dysfunction)    GERD (gastroesophageal  reflux disease)    Hyperlipidemia    Hypertension    Impaired fasting glucose    Lumbar disc disease    Osteoarthritis    Sleep disturbance     Past Surgical History:  Procedure Laterality Date   CATARACT EXTRACTION, BILATERAL  2014   COLONOSCOPY  2013   COLONOSCOPY WITH PROPOFOL  N/A 04/28/2020   Procedure: COLONOSCOPY WITH PROPOFOL ;  Surgeon: Marnee Sink, MD;  Location: [facility] ENDOSCOPY;  Service: Endoscopy;  Laterality: N/A;   HEMIARTHROPLASTY SHOULDER FRACTURE  10/2004   left   JOINT REPLACEMENT  4/12   Left hip--Dr Ivette Marks   KNEE ARTHROSCOPY  06/1998   right Ivette Marks)   LUMBAR LAMINECTOMY  06/10/13   [facility]   REPLACEMENT UNICONDYLAR JOINT KNEE  8/12   Dr Ivette Marks   SHOULDER SURGERY  1950's   dislocation   SHOULDER SURGERY  12/2004   right   THR--left  4/12   Dr Ivette Marks    Family History  Problem Relation Age of Onset   Stomach cancer Mother    Stroke Father    Colon polyps Brother    Colon cancer Neg Hx     Social History   Socioeconomic History   Marital status: Single    Spouse name: Not on file   Number of children: 2   Years of education: Not on file   Highest education level: Not on file  Occupational History   Occupation: retired, Administrator, Civil Service works part time [employer]: RETIRED  Tobacco Use   Smoking status: Former    Current packs/day: 0.00    Types: Cigarettes    Quit date: 11/21/1962    Years since quitting: 61.3    Passive exposure: Past   Smokeless tobacco: Never  Vaping Use   Vaping status: Never Used  Substance and Sexual Activity   Alcohol use: Yes    Alcohol/week: 0.0 standard drinks of alcohol    Comment: occasional   Drug use: No   Sexual activity: Not on file  Other Topics Concern   Not on file  Social History Narrative   Plans to do living will   Will designate daughter Mark Padilla as health care POA.    Would accept resuscitation but no prolonged artificial ventilation   Would not want tube feeds if cognitively  unaware   Social Drivers of Health   Financial Resource Strain: Low Risk  (07/08/2022)   Overall Financial Resource Strain (CARDIA)    Difficulty of Paying Living Expenses: Not very hard  Food Insecurity: No Food Insecurity (07/08/2022)   Hunger Vital Sign    Worried About Running Out of Food in the Last Year: Never true    Ran Out of Food in the Last Year: Never true  Transportation Needs: Not on file  Physical Activity: Not on file  Stress: Not on file  Social Connections: Not on file  Intimate Partner Violence: Not on file   Review of Systems  Eats well Has lost weight in the past 2 years---planned Sleeps okay Wears seat belt Teeth are okay--dentures---doesn't see dentist regularly Occasional heartburn/gas----no dysphagia Bowels move okay since off narcotics. No blood Mild scaly skin lesions---does go to derm regularly (may need cryotherapy)    Objective:   Physical Exam Constitutional:      Appearance: Normal appearance.  HENT:     Mouth/Throat:     Pharynx: No oropharyngeal exudate or posterior oropharyngeal erythema.  Eyes:     Conjunctiva/sclera: Conjunctivae normal.     Pupils: Pupils are equal, round, and reactive to light.  Cardiovascular:     Rate and Rhythm: Normal rate. Rhythm irregular.     Pulses: Normal pulses.     Heart sounds: No murmur heard.    No gallop.  Pulmonary:     Effort: Pulmonary effort is normal.     Breath sounds: Normal breath sounds. No wheezing or rales.  Abdominal:     Palpations: Abdomen is soft.     Tenderness: There is no abdominal tenderness.  Musculoskeletal:     Cervical back: Neck supple.     Right lower leg: No edema.     Left lower leg: No edema.  Lymphadenopathy:     Cervical: No cervical adenopathy.  Skin:    Findings: No lesion or rash.  Neurological:     General: No focal deficit present.     Mental Status: He is alert and oriented to person, place, and time.     Comments: Mini-cog okay---clock good, recall 2/3   Psychiatric:        Mood and Affect: Mood normal.        Behavior: Behavior normal.            Assessment & Plan:

## 2024-03-28 NOTE — Assessment & Plan Note (Signed)
 Chronic anxiety----mild pain related dysphoria Uses the xanax  at night and rarely in the day

## 2024-03-28 NOTE — Assessment & Plan Note (Signed)
 I have personally reviewed the Medicare Annual Wellness questionnaire and have noted 1. The patient's medical and social history 2. Their use of alcohol, tobacco or illicit drugs 3. Their current medications and supplements 4. The patient's functional ability including ADL's, fall risks, home safety risks and hearing or visual             impairment. 5. Diet and physical activities 6. Evidence for depression or mood disorders  The patients weight, height, BMI and visual acuity have been recorded in the chart I have made referrals, counseling and provided education to the patient based review of the above and I have provided the pt with a written personalized care plan for preventive services.  I have provided you with a copy of your personalized plan for preventive services. Please take the time to review along with your updated medication list.  Done with cancer screening Exercises regularly Due for Td--will get at pharmacy Flu/COVID vaccines in the fall

## 2024-03-28 NOTE — Progress Notes (Signed)
 Hearing Screening - Comments:: Has hearing aids. Wearing them today. Vision Screening - Comments:: May 2025

## 2024-03-28 NOTE — Assessment & Plan Note (Signed)
 BP Readings from Last 3 Encounters:  03/28/24 138/80  12/18/23 130/72  03/28/23 124/70   Controlled on hydrochlorothiazide  25

## 2024-03-28 NOTE — Assessment & Plan Note (Signed)
 Shoulders are the worst now On meloxicam  daily--uses tylenol  arthritis prn Avoiding surgery

## 2024-04-08 ENCOUNTER — Other Ambulatory Visit: Payer: Self-pay | Admitting: Internal Medicine

## 2024-04-08 DIAGNOSIS — Z7901 Long term (current) use of anticoagulants: Secondary | ICD-10-CM

## 2024-04-09 NOTE — Telephone Encounter (Signed)
 Pt is compliant with warfarin management and PCP apts.  Sent in refill of warfarin to requested pharmacy.

## 2024-04-25 ENCOUNTER — Ambulatory Visit

## 2024-04-25 DIAGNOSIS — Z7901 Long term (current) use of anticoagulants: Secondary | ICD-10-CM | POA: Diagnosis not present

## 2024-04-25 LAB — POCT INR: INR: 2.2 (ref 2.0–3.0)

## 2024-04-25 NOTE — Progress Notes (Signed)
Continue 1 tablet daily except take 1 1/2 tablets on Mondays. Recheck in  6 weeks.

## 2024-04-25 NOTE — Patient Instructions (Addendum)
Pre visit review using our clinic review tool, if applicable. No additional management support is needed unless otherwise documented below in the visit note.  Continue 1 tablet daily except take 1 1/2 tablets on Mondays. Recheck in  6 weeks.  

## 2024-05-29 DIAGNOSIS — L570 Actinic keratosis: Secondary | ICD-10-CM | POA: Diagnosis not present

## 2024-05-29 DIAGNOSIS — D0461 Carcinoma in situ of skin of right upper limb, including shoulder: Secondary | ICD-10-CM | POA: Diagnosis not present

## 2024-05-29 DIAGNOSIS — D485 Neoplasm of uncertain behavior of skin: Secondary | ICD-10-CM | POA: Diagnosis not present

## 2024-05-29 DIAGNOSIS — D225 Melanocytic nevi of trunk: Secondary | ICD-10-CM | POA: Diagnosis not present

## 2024-05-29 DIAGNOSIS — D2262 Melanocytic nevi of left upper limb, including shoulder: Secondary | ICD-10-CM | POA: Diagnosis not present

## 2024-05-29 DIAGNOSIS — L821 Other seborrheic keratosis: Secondary | ICD-10-CM | POA: Diagnosis not present

## 2024-05-29 DIAGNOSIS — D0439 Carcinoma in situ of skin of other parts of face: Secondary | ICD-10-CM | POA: Diagnosis not present

## 2024-05-29 DIAGNOSIS — D2271 Melanocytic nevi of right lower limb, including hip: Secondary | ICD-10-CM | POA: Diagnosis not present

## 2024-05-29 DIAGNOSIS — D2261 Melanocytic nevi of right upper limb, including shoulder: Secondary | ICD-10-CM | POA: Diagnosis not present

## 2024-05-29 DIAGNOSIS — D2272 Melanocytic nevi of left lower limb, including hip: Secondary | ICD-10-CM | POA: Diagnosis not present

## 2024-05-29 DIAGNOSIS — L905 Scar conditions and fibrosis of skin: Secondary | ICD-10-CM | POA: Diagnosis not present

## 2024-06-01 ENCOUNTER — Other Ambulatory Visit: Payer: Self-pay | Admitting: Internal Medicine

## 2024-06-06 ENCOUNTER — Ambulatory Visit (INDEPENDENT_AMBULATORY_CARE_PROVIDER_SITE_OTHER)

## 2024-06-06 DIAGNOSIS — Z7901 Long term (current) use of anticoagulants: Secondary | ICD-10-CM

## 2024-06-06 LAB — POCT INR: INR: 2.8 (ref 2.0–3.0)

## 2024-06-06 NOTE — Patient Instructions (Addendum)
Pre visit review using our clinic review tool, if applicable. No additional management support is needed unless otherwise documented below in the visit note.  Continue 1 tablet daily except take 1 1/2 tablets on Mondays. Recheck in  6 weeks.  

## 2024-06-06 NOTE — Progress Notes (Signed)
Continue 1 tablet daily except take 1 1/2 tablets on Mondays. Recheck in  6 weeks.

## 2024-06-24 DIAGNOSIS — Z96611 Presence of right artificial shoulder joint: Secondary | ICD-10-CM | POA: Diagnosis not present

## 2024-06-24 DIAGNOSIS — M19012 Primary osteoarthritis, left shoulder: Secondary | ICD-10-CM | POA: Diagnosis not present

## 2024-06-24 DIAGNOSIS — M19011 Primary osteoarthritis, right shoulder: Secondary | ICD-10-CM | POA: Diagnosis not present

## 2024-06-24 DIAGNOSIS — Z96612 Presence of left artificial shoulder joint: Secondary | ICD-10-CM | POA: Diagnosis not present

## 2024-07-01 DIAGNOSIS — M47816 Spondylosis without myelopathy or radiculopathy, lumbar region: Secondary | ICD-10-CM | POA: Diagnosis not present

## 2024-07-01 DIAGNOSIS — Z7901 Long term (current) use of anticoagulants: Secondary | ICD-10-CM | POA: Diagnosis not present

## 2024-07-04 ENCOUNTER — Telehealth: Payer: Self-pay | Admitting: Internal Medicine

## 2024-07-04 NOTE — Telephone Encounter (Signed)
 Pt says he wants to be establish with Dr. Cleatus and was wondering if he can be accepted.

## 2024-07-08 NOTE — Telephone Encounter (Signed)
Please scheduled patient. Thank you

## 2024-07-08 NOTE — Telephone Encounter (Signed)
 Yes, please schedule next routine visit with me. Thanks.

## 2024-07-08 NOTE — Telephone Encounter (Signed)
 lvm for pt to call office to schedule appt.

## 2024-07-12 DIAGNOSIS — M19012 Primary osteoarthritis, left shoulder: Secondary | ICD-10-CM | POA: Diagnosis not present

## 2024-07-12 DIAGNOSIS — Z96611 Presence of right artificial shoulder joint: Secondary | ICD-10-CM | POA: Diagnosis not present

## 2024-07-12 DIAGNOSIS — M19011 Primary osteoarthritis, right shoulder: Secondary | ICD-10-CM | POA: Diagnosis not present

## 2024-07-12 DIAGNOSIS — Z96612 Presence of left artificial shoulder joint: Secondary | ICD-10-CM | POA: Diagnosis not present

## 2024-07-16 DIAGNOSIS — Z03818 Encounter for observation for suspected exposure to other biological agents ruled out: Secondary | ICD-10-CM | POA: Diagnosis not present

## 2024-07-16 DIAGNOSIS — Z7901 Long term (current) use of anticoagulants: Secondary | ICD-10-CM | POA: Diagnosis not present

## 2024-07-16 DIAGNOSIS — J029 Acute pharyngitis, unspecified: Secondary | ICD-10-CM | POA: Diagnosis not present

## 2024-07-17 DIAGNOSIS — M47816 Spondylosis without myelopathy or radiculopathy, lumbar region: Secondary | ICD-10-CM | POA: Diagnosis not present

## 2024-07-18 ENCOUNTER — Ambulatory Visit (INDEPENDENT_AMBULATORY_CARE_PROVIDER_SITE_OTHER)

## 2024-07-18 ENCOUNTER — Encounter: Payer: Self-pay | Admitting: Family Medicine

## 2024-07-18 ENCOUNTER — Ambulatory Visit (INDEPENDENT_AMBULATORY_CARE_PROVIDER_SITE_OTHER): Admitting: Family Medicine

## 2024-07-18 VITALS — BP 126/72 | HR 66 | Temp 97.7°F | Ht 65.5 in | Wt 164.0 lb

## 2024-07-18 DIAGNOSIS — J4520 Mild intermittent asthma, uncomplicated: Secondary | ICD-10-CM | POA: Diagnosis not present

## 2024-07-18 DIAGNOSIS — Z7189 Other specified counseling: Secondary | ICD-10-CM | POA: Diagnosis not present

## 2024-07-18 DIAGNOSIS — M545 Low back pain, unspecified: Secondary | ICD-10-CM | POA: Diagnosis not present

## 2024-07-18 DIAGNOSIS — R399 Unspecified symptoms and signs involving the genitourinary system: Secondary | ICD-10-CM

## 2024-07-18 DIAGNOSIS — I4819 Other persistent atrial fibrillation: Secondary | ICD-10-CM | POA: Diagnosis not present

## 2024-07-18 DIAGNOSIS — G8929 Other chronic pain: Secondary | ICD-10-CM

## 2024-07-18 DIAGNOSIS — Z7901 Long term (current) use of anticoagulants: Secondary | ICD-10-CM | POA: Diagnosis not present

## 2024-07-18 DIAGNOSIS — G4700 Insomnia, unspecified: Secondary | ICD-10-CM | POA: Diagnosis not present

## 2024-07-18 LAB — POCT INR: INR: 1.5 — AB (ref 2.0–3.0)

## 2024-07-18 NOTE — Patient Instructions (Addendum)
 Pre visit review using our clinic review tool, if applicable. No additional management support is needed unless otherwise documented below in the visit note.  Increase dose today to take 1 1/2 tablets and then continue 1 tablet daily except take 1 1/2 tablets on Mondays. Recheck in 4 weeks.

## 2024-07-18 NOTE — Patient Instructions (Signed)
 Let me know if you need refills.  Take care.  Glad to see you. Recheck at a yearly visit around May of 2026.   Update me as needed.

## 2024-07-18 NOTE — Progress Notes (Signed)
 Pt also has apt with PCP today. Pt reports he had a nerve burned in his back again on 8/26, two days ago. He held his warfarin that day and then took an extra half the day after. He also had bilateral shoulder injections on 8/25. He was prescribed doxycycline  on 8/26 for a sinus infection. This has the potential to interact.  Since pt already increased dose yesterday will only boost dose today. Pt has been stable on this dose for quite some time, so no change in weekly dose.  Increase dose today to take 1 1/2 tablets and then continue 1 tablet daily except take 1 1/2 tablets on Mondays. Recheck in 4 weeks.

## 2024-07-18 NOTE — Progress Notes (Signed)
 Daughter Monta designated if patient were incapacitated.    He had ablation for back pain yesterday.  He had to quit fishing due to back pain.  Prev enjoyed salt water fishing.  His pain is better today.  Still taking gabapentin , that helped with pain and he failed taper (with inc pain).  Taking xanax  at night to sleep, chronic use, going on for years.  He couldn't sleep w/o med use.  Fall cautions d/w pt.   AF.  On coumadin .  On doxy for sinus infection, with cautions discussed with patient.  No bleeding.    He had narrowing and slowing of his urine stream.  Already on flomax .  He can manage as is and I asked him to update me as needed.    H/o asthmatic bronchitis, improved on advair, used when he has concurrent illness.  Still on sigulair.    Had a flu shot this week at [facility].    Meds, vitals, and allergies reviewed.   ROS: Per HPI unless specifically indicated in ROS section   Nad Ncat Neck supple, no LA IRR Ctab Abdomen soft.  Nontender. Skin well-perfused.   Small amount of bruising on the L lower back at injection site.

## 2024-07-22 DIAGNOSIS — R399 Unspecified symptoms and signs involving the genitourinary system: Secondary | ICD-10-CM | POA: Insufficient documentation

## 2024-07-22 DIAGNOSIS — G4700 Insomnia, unspecified: Secondary | ICD-10-CM | POA: Insufficient documentation

## 2024-07-22 NOTE — Assessment & Plan Note (Signed)
 He had narrowing and slowing of his urine stream.  Already on flomax .  He can manage as is and I asked him to update me as needed.   Would continue Flomax  as this for now.

## 2024-07-22 NOTE — Assessment & Plan Note (Signed)
 Daughter Monta designated if patient were incapacitated.

## 2024-07-22 NOTE — Assessment & Plan Note (Signed)
 Taking xanax  at night to sleep, chronic use, going on for years.  He couldn't sleep w/o med use.  Fall cautions d/w pt. would continue as is.

## 2024-07-22 NOTE — Assessment & Plan Note (Signed)
 H/o asthmatic bronchitis, improved on advair, used when he has concurrent illness.  Still on sigulair.   Would continue as is.

## 2024-07-22 NOTE — Assessment & Plan Note (Signed)
 His pain is better today after recent ablation.  Still taking gabapentin , that helped with pain and he failed taper (with inc pain).  Would continue gabapentin  for now.

## 2024-07-22 NOTE — Assessment & Plan Note (Signed)
 Warfarin cautions and interactions discussed with patient.  Per anticoagulation clinic.  Continue warfarin as is.

## 2024-07-25 DIAGNOSIS — L578 Other skin changes due to chronic exposure to nonionizing radiation: Secondary | ICD-10-CM | POA: Diagnosis not present

## 2024-07-25 DIAGNOSIS — D0460 Carcinoma in situ of skin of unspecified upper limb, including shoulder: Secondary | ICD-10-CM | POA: Diagnosis not present

## 2024-08-06 DIAGNOSIS — J029 Acute pharyngitis, unspecified: Secondary | ICD-10-CM | POA: Diagnosis not present

## 2024-08-06 DIAGNOSIS — B9689 Other specified bacterial agents as the cause of diseases classified elsewhere: Secondary | ICD-10-CM | POA: Diagnosis not present

## 2024-08-06 DIAGNOSIS — Z7901 Long term (current) use of anticoagulants: Secondary | ICD-10-CM | POA: Diagnosis not present

## 2024-08-06 DIAGNOSIS — J019 Acute sinusitis, unspecified: Secondary | ICD-10-CM | POA: Diagnosis not present

## 2024-08-06 DIAGNOSIS — R051 Acute cough: Secondary | ICD-10-CM | POA: Diagnosis not present

## 2024-08-07 DIAGNOSIS — D0461 Carcinoma in situ of skin of right upper limb, including shoulder: Secondary | ICD-10-CM | POA: Diagnosis not present

## 2024-08-07 DIAGNOSIS — D0439 Carcinoma in situ of skin of other parts of face: Secondary | ICD-10-CM | POA: Diagnosis not present

## 2024-08-14 ENCOUNTER — Other Ambulatory Visit: Payer: Self-pay | Admitting: Family Medicine

## 2024-08-14 MED ORDER — ALPRAZOLAM 1 MG PO TABS: 1.0000 mg | ORAL_TABLET | Freq: Two times a day (BID) | ORAL | 0 refills | Status: AC | PRN

## 2024-08-14 NOTE — Progress Notes (Signed)
 Xanax  rx sent after request [facility].  PDMP reviewed.

## 2024-08-15 ENCOUNTER — Ambulatory Visit

## 2024-08-15 DIAGNOSIS — Z7901 Long term (current) use of anticoagulants: Secondary | ICD-10-CM

## 2024-08-15 LAB — POCT INR: INR: 3.4 — AB (ref 2.0–3.0)

## 2024-08-15 NOTE — Patient Instructions (Addendum)
 Pre visit review using our clinic review tool, if applicable. No additional management support is needed unless otherwise documented below in the visit note.  Hold warfarin tomorrow and then continue 1 tablet daily except take 1 1/2 tablets on Mondays. Recheck in 5 weeks

## 2024-08-15 NOTE — Progress Notes (Signed)
 Pt prescribed Augmentin  and prednisone  on 9/16 by UC for URI. Abx does not interact but prednisone  can increase or decrease INR. Due to prednisone  interaction will not change weekly dose.  Pt already took warfarin today. Hold warfarin tomorrow and then continue 1 tablet daily except take 1 1/2 tablets on Mondays. Recheck in 5 weeks per pt request due to being out of town.

## 2024-08-23 ENCOUNTER — Other Ambulatory Visit: Payer: Self-pay

## 2024-08-23 DIAGNOSIS — I10 Essential (primary) hypertension: Secondary | ICD-10-CM

## 2024-08-23 DIAGNOSIS — N400 Enlarged prostate without lower urinary tract symptoms: Secondary | ICD-10-CM

## 2024-08-23 DIAGNOSIS — J4520 Mild intermittent asthma, uncomplicated: Secondary | ICD-10-CM

## 2024-08-26 MED ORDER — MONTELUKAST SODIUM 10 MG PO TABS: 10.0000 mg | ORAL_TABLET | Freq: Every day | ORAL | 3 refills | Status: AC

## 2024-08-26 MED ORDER — TAMSULOSIN HCL 0.4 MG PO CAPS: 0.4000 mg | ORAL_CAPSULE | Freq: Every day | ORAL | 3 refills | Status: AC

## 2024-08-26 MED ORDER — HYDROCHLOROTHIAZIDE 25 MG PO TABS: 25.0000 mg | ORAL_TABLET | Freq: Every day | ORAL | 3 refills | Status: AC

## 2024-09-19 ENCOUNTER — Ambulatory Visit (INDEPENDENT_AMBULATORY_CARE_PROVIDER_SITE_OTHER)

## 2024-09-19 DIAGNOSIS — Z7901 Long term (current) use of anticoagulants: Secondary | ICD-10-CM

## 2024-09-19 LAB — POCT INR: INR: 2.6 (ref 2.0–3.0)

## 2024-09-19 NOTE — Progress Notes (Signed)
Continue 1 tablet daily except take 1 1/2 tablets on Mondays. Recheck in  6 weeks.

## 2024-09-19 NOTE — Patient Instructions (Addendum)
Pre visit review using our clinic review tool, if applicable. No additional management support is needed unless otherwise documented below in the visit note.  Continue 1 tablet daily except take 1 1/2 tablets on Mondays. Recheck in  6 weeks.  

## 2024-10-31 ENCOUNTER — Ambulatory Visit

## 2024-10-31 DIAGNOSIS — Z7901 Long term (current) use of anticoagulants: Secondary | ICD-10-CM

## 2024-10-31 LAB — POCT INR: INR: 4.3 — AB (ref 2.0–3.0)

## 2024-10-31 NOTE — Progress Notes (Signed)
 Pt was prescribed prednisone  taper on 12/4 x 10 days and tramadol for shoulder pain. Pt reports he is taking tramadol TID. Both can interact with warfarin and tramadol can increase INR drastically at that dose. Pt also has another apt on 12/22 with orthopedics to discuss shoulder surgery. Due to this interaction there will be a change to weekly dosing but pt was advised if he changes dosing of tramadol or stops taking it to contact the coumadin  clinic. Pt verbalized understanding. Pt was stable on prior dose so change in tramadol dosing could cause pt to need to go back to prior dosing. Pt denies any s/s of bleeding or abnormal bruising. Advised if any s/s to go to ER. Pt verbalized understanding. Pt already took warfarin today. Hold warfarin tomorrow and then change weekly dose to take 1 tablet daily except take 1/2 on Satdays. Recheck in 3 weeks.

## 2024-10-31 NOTE — Patient Instructions (Addendum)
 Pre visit review using our clinic review tool, if applicable. No additional management support is needed unless otherwise documented below in the visit note.  Hold warfarin tomorrow and then change weekly dose to take 1 tablet daily except take 1/2 on Satdays. Recheck in 3 weeks.

## 2024-11-04 ENCOUNTER — Other Ambulatory Visit: Payer: Self-pay

## 2024-11-04 NOTE — Telephone Encounter (Signed)
 Given in past by Dr. Jimmy. Ok to refill until nest appointment in may?

## 2024-11-05 MED ORDER — ATORVASTATIN CALCIUM 10 MG PO TABS: 10.0000 mg | ORAL_TABLET | Freq: Every day | ORAL | 1 refills | Status: AC

## 2024-11-05 NOTE — Telephone Encounter (Signed)
 Sent. Thanks.

## 2024-11-13 ENCOUNTER — Other Ambulatory Visit: Payer: Self-pay | Admitting: Surgery

## 2024-11-13 ENCOUNTER — Telehealth: Payer: Self-pay | Admitting: Cardiovascular Disease

## 2024-11-13 DIAGNOSIS — Z96612 Presence of left artificial shoulder joint: Secondary | ICD-10-CM

## 2024-11-13 DIAGNOSIS — M19012 Primary osteoarthritis, left shoulder: Secondary | ICD-10-CM

## 2024-11-13 NOTE — Telephone Encounter (Signed)
"  ° °  Pre-operative Risk Assessment    Patient Name: Mark Padilla  DOB: [birth year] MRN: [id number]   Date of last office visit: unknown Date of next office visit: unknown   Request for Surgical Clearance    Procedure:  Left total shoulder arthroplasty  Date of Surgery:  Clearance TBD                                Surgeon:  Dr, Edie Surgeon's Group or Practice Name:  [facility] Phone number:  [phone] Fax number:  [phone]   Type of Clearance Requested:   - Pharmacy:  Hold .   how o take blood thinners beforew and after surgery   Type of Anesthesia:  Not Indicated   Additional requests/questions:    Bonney Berwyn LELON Jackson   11/13/2024, 10:29 AM   "

## 2024-11-15 NOTE — Telephone Encounter (Signed)
" ° °  Patient Name: Mark Padilla  DOB: [birth year] MRN: [id number]  Primary Cardiologist: None  Chart reviewed as part of pre-operative protocol coverage. Pre-op clearance already addressed by colleagues in earlier phone notes. To summarize recommendations:  -Per office protocol, patient can hold warfarin for 5 days prior to procedure.    Will route this bundled recommendation to requesting provider via Epic fax function and remove from pre-op pool. Please call with questions.  Mardy KATHEE Pizza, FNP 11/15/2024, 10:41 AM  "

## 2024-11-15 NOTE — Telephone Encounter (Signed)
 Patient with diagnosis of afib on warfarin for anticoagulation.    Procedure: Left total shoulder arthroplasty  Date of procedure: TBD   CHA2DS2-VASc Score = 3   This indicates a 3.2% annual risk of stroke. The patient's score is based upon: CHF History: 0 HTN History: 1 Diabetes History: 0 Stroke History: 0 Vascular Disease History: 0 Age Score: 2 Gender Score: 0      CrCl 58 ml/min Platelet count 249  Patient has not had an Afib/aflutter ablation in the last 3 months, DCCV within the last 4 weeks or a watchman implanted in the last 45 days   Per office protocol, patient can hold warfarin for 5 days prior to procedure.    Patient will not need bridging with Lovenox  (enoxaparin ) around procedure.  **This guidance is not considered finalized until pre-operative APP has relayed final recommendations.**

## 2024-11-19 ENCOUNTER — Ambulatory Visit (INDEPENDENT_AMBULATORY_CARE_PROVIDER_SITE_OTHER)

## 2024-11-19 DIAGNOSIS — I4819 Other persistent atrial fibrillation: Secondary | ICD-10-CM

## 2024-11-19 DIAGNOSIS — I480 Paroxysmal atrial fibrillation: Secondary | ICD-10-CM

## 2024-11-19 DIAGNOSIS — Z7901 Long term (current) use of anticoagulants: Secondary | ICD-10-CM

## 2024-11-19 LAB — PROTIME-INR
INR: 5.1 ratio — ABNORMAL HIGH (ref 0.8–1.0)
Prothrombin Time: 49 s — ABNORMAL HIGH (ref 9.6–13.1)

## 2024-11-19 NOTE — Progress Notes (Signed)
 Pt has been taking tramadol for shoulder pain. He is taking up to 3 tablets daily. This can interact with warfarin at this dosing. Pt made aware of interaction. Pt denies any s/s of bleeding or abnormal bruising. Advised if any s/s to go to ER. Pt verbalized understanding. Pt will have shoulder surgery soon. Per cardiology, no Lovenox  bridge is necessary and pt can hold warfarin for 5 days.  Pt already took warfarin today. Hold warfarin tomorrow and reduce dose the following day to take 1/2 tablet and then change weekly dose to take 1 tablet daily except take 1/2 on Monday, Wednesday and Friday. Recheck in 2 weeks. Contacted pt with dosing and scheduled next coumadin  clinic apt. Pt wrote dosing instructions down and read back instructions. Pt denies any s/s of bleeding or abnormal bruising. Advised if any s/s to go to ER. Pt verbalized understanding.

## 2024-11-19 NOTE — Patient Instructions (Addendum)
 Pre visit review using our clinic review tool, if applicable. No additional management support is needed unless otherwise documented below in the visit note.  Hold warfarin tomorrow and reduce dose the following day to take 1/2 tablet and then change weekly dose to take 1 tablet daily except take 1/2 on Monday, Wednesday and Friday. Recheck in 2 weeks.

## 2024-11-20 ENCOUNTER — Ambulatory Visit
Admission: RE | Admit: 2024-11-20 | Discharge: 2024-11-20 | Disposition: A | Discharge status: Home / Self Care | Source: Ambulatory Visit | Attending: Surgery | Admitting: Surgery

## 2024-11-20 DIAGNOSIS — M19012 Primary osteoarthritis, left shoulder: Secondary | ICD-10-CM | POA: Diagnosis present

## 2024-11-20 DIAGNOSIS — Z96612 Presence of left artificial shoulder joint: Secondary | ICD-10-CM | POA: Insufficient documentation

## 2024-11-24 ENCOUNTER — Ambulatory Visit: Payer: Self-pay | Admitting: Family Medicine

## 2024-12-05 ENCOUNTER — Ambulatory Visit

## 2024-12-05 DIAGNOSIS — Z7901 Long term (current) use of anticoagulants: Secondary | ICD-10-CM

## 2024-12-05 LAB — POCT INR: INR: 3.6 — AB (ref 2.0–3.0)

## 2024-12-05 NOTE — Patient Instructions (Addendum)
 Pre visit review using our clinic review tool, if applicable. No additional management support is needed unless otherwise documented below in the visit note.  Hold warfarin today and then change weekly dose to take 1/2 tablet daily except take 1 tablet on Sunday, Tuesday and Thursday.Recheck in 2 weeks.

## 2024-12-05 NOTE — Progress Notes (Signed)
 Pt is taking Vicodin now and has stopped taking tramadol. Pt is scheduled for shoulder surgery for 3/5. Per cardiology, no lovenox  bridge is needed. Vicodin does not interact with warfarin. Inquired if diet has changed and pt denies any changes.  Hold warfarin today and then change weekly dose to take 1/2 tablet daily except take 1 tablets on Sunday, Tuesday and Thursday.Recheck in 2 weeks. Pt denies any s/s of bleeding or abnormal bruising. Advised if any s/s to go to ER. Pt verbalized understanding.

## 2024-12-19 ENCOUNTER — Ambulatory Visit

## 2024-12-19 DIAGNOSIS — Z7901 Long term (current) use of anticoagulants: Secondary | ICD-10-CM

## 2024-12-19 LAB — POCT INR: INR: 1.4 — AB (ref 2.0–3.0)

## 2024-12-19 NOTE — Progress Notes (Signed)
 Pt has stopped all Vicodin and also tramadol. It has been seen in coumadin  clinic that tramadol increases INR. Pt reported today he has been taking tramadol during the last two supratherapeutic INR results. Pt is scheduled for shoulder surgery for 3/5. Per cardiology, no lovenox  bridge is needed.  Due to the change in tramadol use and large change in INR there will be a change in weekly dose. Pt will return in 2 weeks for INR check. Increase dose today to take 1 1/2 tablets and then change weekly dose to take 1 tablet daily except take 1/2 tablets on Monday, Wednesday and Friday.Recheck in 2 weeks.

## 2024-12-19 NOTE — Patient Instructions (Addendum)
 Pre visit review using our clinic review tool, if applicable. No additional management support is needed unless otherwise documented below in the visit note.  Increase dose today to take 1 1/2 tablets and then change weekly dose to take 1 tablet daily except take 1/2 tablets on Monday, Wednesday and Friday.Recheck in 2 weeks.

## 2025-01-02 ENCOUNTER — Ambulatory Visit

## 2025-04-10 ENCOUNTER — Other Ambulatory Visit

## 2025-04-17 ENCOUNTER — Encounter: Admitting: Family Medicine
